# Patient Record
Sex: Female | Born: 1960 | Race: Black or African American | Hispanic: No | Smoking: Current every day smoker
Health system: Southern US, Community
[De-identification: ages and names within clinical notes are randomized; demographics above are authoritative.]

## PROBLEM LIST (undated history)

## (undated) DIAGNOSIS — F329 Major depressive disorder, single episode, unspecified: Secondary | ICD-10-CM

## (undated) DIAGNOSIS — I493 Ventricular premature depolarization: Secondary | ICD-10-CM

## (undated) DIAGNOSIS — F32A Depression, unspecified: Secondary | ICD-10-CM

## (undated) DIAGNOSIS — J302 Other seasonal allergic rhinitis: Secondary | ICD-10-CM

## (undated) DIAGNOSIS — M5481 Occipital neuralgia: Secondary | ICD-10-CM

## (undated) DIAGNOSIS — F419 Anxiety disorder, unspecified: Secondary | ICD-10-CM

## (undated) DIAGNOSIS — K219 Gastro-esophageal reflux disease without esophagitis: Secondary | ICD-10-CM

## (undated) DIAGNOSIS — Z87898 Personal history of other specified conditions: Secondary | ICD-10-CM

## (undated) DIAGNOSIS — G43909 Migraine, unspecified, not intractable, without status migrainosus: Secondary | ICD-10-CM

## (undated) DIAGNOSIS — I472 Ventricular tachycardia: Secondary | ICD-10-CM

## (undated) DIAGNOSIS — G709 Myoneural disorder, unspecified: Secondary | ICD-10-CM

## (undated) DIAGNOSIS — M1990 Unspecified osteoarthritis, unspecified site: Secondary | ICD-10-CM

## (undated) HISTORY — DX: Occipital neuralgia: M54.81

## (undated) HISTORY — DX: Anxiety disorder, unspecified: F41.9

## (undated) HISTORY — PX: MRI: SHX5353

## (undated) HISTORY — PX: TOTAL ABDOMINAL HYSTERECTOMY: SHX209

## (undated) HISTORY — PX: LAMINECTOMY: SHX219

## (undated) HISTORY — PX: TUBAL LIGATION: SHX77

## (undated) HISTORY — DX: Gastro-esophageal reflux disease without esophagitis: K21.9

## (undated) HISTORY — DX: Migraine, unspecified, not intractable, without status migrainosus: G43.909

## (undated) HISTORY — DX: Personal history of other specified conditions: Z87.898

## (undated) HISTORY — DX: Ventricular tachycardia: I47.2

## (undated) HISTORY — PX: CERVICAL FUSION: SHX112

## (undated) HISTORY — DX: Myoneural disorder, unspecified: G70.9

## (undated) HISTORY — DX: Unspecified osteoarthritis, unspecified site: M19.90

## (undated) HISTORY — DX: Major depressive disorder, single episode, unspecified: F32.9

## (undated) HISTORY — PX: CHOLECYSTECTOMY: SHX55

## (undated) HISTORY — DX: Other seasonal allergic rhinitis: J30.2

## (undated) HISTORY — DX: Ventricular premature depolarization: I49.3

## (undated) HISTORY — DX: Depression, unspecified: F32.A

---

## 1998-06-06 ENCOUNTER — Ambulatory Visit (HOSPITAL_COMMUNITY): Admission: RE | Admit: 1998-06-06 | Discharge: 1998-06-06 | Payer: Self-pay | Admitting: Neurosurgery

## 1998-06-15 ENCOUNTER — Inpatient Hospital Stay (HOSPITAL_COMMUNITY): Admission: RE | Admit: 1998-06-15 | Discharge: 1998-06-17 | Payer: Self-pay | Admitting: Neurosurgery

## 1998-08-02 ENCOUNTER — Ambulatory Visit (HOSPITAL_COMMUNITY): Admission: RE | Admit: 1998-08-02 | Discharge: 1998-08-02 | Payer: Self-pay | Admitting: Neurosurgery

## 1999-02-01 ENCOUNTER — Inpatient Hospital Stay (HOSPITAL_COMMUNITY): Admission: RE | Admit: 1999-02-01 | Discharge: 1999-02-02 | Payer: Self-pay | Admitting: Neurosurgery

## 1999-02-01 ENCOUNTER — Encounter: Payer: Self-pay | Admitting: Neurosurgery

## 1999-11-10 ENCOUNTER — Ambulatory Visit (HOSPITAL_COMMUNITY): Admission: RE | Admit: 1999-11-10 | Discharge: 1999-11-10 | Payer: Self-pay | Admitting: Neurosurgery

## 1999-11-15 ENCOUNTER — Ambulatory Visit (HOSPITAL_COMMUNITY): Admission: RE | Admit: 1999-11-15 | Discharge: 1999-11-15 | Payer: Self-pay | Admitting: Neurosurgery

## 1999-11-15 ENCOUNTER — Encounter: Payer: Self-pay | Admitting: Neurosurgery

## 1999-11-30 ENCOUNTER — Inpatient Hospital Stay (HOSPITAL_COMMUNITY): Admission: RE | Admit: 1999-11-30 | Discharge: 1999-12-01 | Payer: Self-pay | Admitting: Neurosurgery

## 1999-11-30 ENCOUNTER — Encounter: Payer: Self-pay | Admitting: Neurosurgery

## 2000-01-31 ENCOUNTER — Encounter: Payer: Self-pay | Admitting: Neurosurgery

## 2000-01-31 ENCOUNTER — Encounter: Admission: RE | Admit: 2000-01-31 | Discharge: 2000-01-31 | Payer: Self-pay | Admitting: Neurosurgery

## 2000-04-08 ENCOUNTER — Encounter: Payer: Self-pay | Admitting: Neurosurgery

## 2000-04-08 ENCOUNTER — Encounter: Admission: RE | Admit: 2000-04-08 | Discharge: 2000-04-08 | Payer: Self-pay | Admitting: Neurosurgery

## 2001-07-04 ENCOUNTER — Other Ambulatory Visit: Admission: RE | Admit: 2001-07-04 | Discharge: 2001-07-04 | Payer: Self-pay | Admitting: Internal Medicine

## 2002-04-06 ENCOUNTER — Encounter: Payer: Self-pay | Admitting: Family Medicine

## 2002-04-06 ENCOUNTER — Encounter: Admission: RE | Admit: 2002-04-06 | Discharge: 2002-04-06 | Payer: Self-pay | Admitting: Family Medicine

## 2002-12-01 ENCOUNTER — Emergency Department (HOSPITAL_COMMUNITY): Admission: EM | Admit: 2002-12-01 | Discharge: 2002-12-01 | Payer: Self-pay

## 2002-12-15 ENCOUNTER — Emergency Department (HOSPITAL_COMMUNITY): Admission: EM | Admit: 2002-12-15 | Discharge: 2002-12-15 | Payer: Self-pay | Admitting: Emergency Medicine

## 2002-12-15 ENCOUNTER — Encounter: Payer: Self-pay | Admitting: Emergency Medicine

## 2003-09-18 ENCOUNTER — Emergency Department (HOSPITAL_COMMUNITY): Admission: AD | Admit: 2003-09-18 | Discharge: 2003-09-19 | Payer: Self-pay | Admitting: Family Medicine

## 2003-12-17 ENCOUNTER — Inpatient Hospital Stay (HOSPITAL_COMMUNITY): Admission: RE | Admit: 2003-12-17 | Discharge: 2003-12-20 | Payer: Self-pay | Admitting: Neurosurgery

## 2004-02-18 ENCOUNTER — Emergency Department (HOSPITAL_COMMUNITY): Admission: EM | Admit: 2004-02-18 | Discharge: 2004-02-18 | Payer: Self-pay | Admitting: Emergency Medicine

## 2004-03-03 ENCOUNTER — Encounter: Admission: RE | Admit: 2004-03-03 | Discharge: 2004-03-03 | Payer: Self-pay | Admitting: Internal Medicine

## 2004-03-09 ENCOUNTER — Encounter: Admission: RE | Admit: 2004-03-09 | Discharge: 2004-03-09 | Payer: Self-pay | Admitting: Internal Medicine

## 2004-04-04 ENCOUNTER — Encounter: Admission: RE | Admit: 2004-04-04 | Discharge: 2004-04-04 | Payer: Self-pay | Admitting: Neurosurgery

## 2004-04-17 ENCOUNTER — Encounter (HOSPITAL_COMMUNITY): Admission: RE | Admit: 2004-04-17 | Discharge: 2004-05-17 | Payer: Self-pay | Admitting: Neurosurgery

## 2004-04-22 ENCOUNTER — Emergency Department (HOSPITAL_COMMUNITY): Admission: EM | Admit: 2004-04-22 | Discharge: 2004-04-23 | Payer: Self-pay | Admitting: Emergency Medicine

## 2004-05-17 ENCOUNTER — Encounter (HOSPITAL_COMMUNITY): Admission: RE | Admit: 2004-05-17 | Discharge: 2004-06-16 | Payer: Self-pay | Admitting: Neurosurgery

## 2004-06-19 ENCOUNTER — Encounter (HOSPITAL_COMMUNITY): Admission: RE | Admit: 2004-06-19 | Discharge: 2004-07-19 | Payer: Self-pay | Admitting: Neurosurgery

## 2004-06-27 ENCOUNTER — Ambulatory Visit (HOSPITAL_COMMUNITY): Admission: RE | Admit: 2004-06-27 | Discharge: 2004-06-27 | Payer: Self-pay | Admitting: Cardiology

## 2004-11-07 ENCOUNTER — Emergency Department (HOSPITAL_COMMUNITY): Admission: EM | Admit: 2004-11-07 | Discharge: 2004-11-07 | Payer: Self-pay | Admitting: Emergency Medicine

## 2005-02-08 ENCOUNTER — Ambulatory Visit (HOSPITAL_COMMUNITY): Admission: RE | Admit: 2005-02-08 | Discharge: 2005-02-08 | Payer: Self-pay | Admitting: Neurosurgery

## 2005-02-19 ENCOUNTER — Emergency Department (HOSPITAL_COMMUNITY): Admission: EM | Admit: 2005-02-19 | Discharge: 2005-02-19 | Payer: Self-pay | Admitting: Emergency Medicine

## 2005-02-20 ENCOUNTER — Emergency Department (HOSPITAL_COMMUNITY): Admission: EM | Admit: 2005-02-20 | Discharge: 2005-02-21 | Payer: Self-pay | Admitting: *Deleted

## 2005-02-23 ENCOUNTER — Ambulatory Visit: Payer: Self-pay | Admitting: Cardiology

## 2005-02-27 ENCOUNTER — Encounter: Admission: RE | Admit: 2005-02-27 | Discharge: 2005-02-27 | Payer: Self-pay | Admitting: Internal Medicine

## 2005-03-02 ENCOUNTER — Ambulatory Visit: Payer: Self-pay | Admitting: Cardiology

## 2005-04-02 ENCOUNTER — Other Ambulatory Visit: Admission: RE | Admit: 2005-04-02 | Discharge: 2005-04-02 | Payer: Self-pay | Admitting: Family Medicine

## 2005-05-15 ENCOUNTER — Ambulatory Visit: Payer: Self-pay | Admitting: Internal Medicine

## 2005-05-19 ENCOUNTER — Ambulatory Visit: Payer: Self-pay | Admitting: Family Medicine

## 2005-05-21 ENCOUNTER — Emergency Department (HOSPITAL_COMMUNITY): Admission: EM | Admit: 2005-05-21 | Discharge: 2005-05-21 | Payer: Self-pay | Admitting: Emergency Medicine

## 2005-08-28 ENCOUNTER — Encounter (HOSPITAL_COMMUNITY): Admission: RE | Admit: 2005-08-28 | Discharge: 2005-09-27 | Payer: Self-pay

## 2005-10-02 ENCOUNTER — Encounter (HOSPITAL_COMMUNITY)
Admission: RE | Admit: 2005-10-02 | Discharge: 2005-10-25 | Payer: Self-pay | Admitting: Physical Medicine and Rehabilitation

## 2005-10-31 ENCOUNTER — Encounter (HOSPITAL_COMMUNITY)
Admission: RE | Admit: 2005-10-31 | Discharge: 2005-11-30 | Payer: Self-pay | Admitting: Physical Medicine and Rehabilitation

## 2005-11-05 ENCOUNTER — Emergency Department (HOSPITAL_COMMUNITY): Admission: EM | Admit: 2005-11-05 | Discharge: 2005-11-05 | Payer: Self-pay | Admitting: Emergency Medicine

## 2005-11-13 ENCOUNTER — Ambulatory Visit (HOSPITAL_COMMUNITY): Admission: RE | Admit: 2005-11-13 | Discharge: 2005-11-13 | Payer: Self-pay | Admitting: Family Medicine

## 2005-12-05 ENCOUNTER — Encounter (HOSPITAL_COMMUNITY)
Admission: RE | Admit: 2005-12-05 | Discharge: 2006-01-04 | Payer: Self-pay | Admitting: Physical Medicine and Rehabilitation

## 2006-01-12 ENCOUNTER — Emergency Department (HOSPITAL_COMMUNITY): Admission: EM | Admit: 2006-01-12 | Discharge: 2006-01-12 | Payer: Self-pay | Admitting: Emergency Medicine

## 2006-01-17 ENCOUNTER — Encounter
Admission: RE | Admit: 2006-01-17 | Discharge: 2006-04-17 | Payer: Self-pay | Admitting: Physical Medicine and Rehabilitation

## 2006-04-26 ENCOUNTER — Ambulatory Visit (HOSPITAL_COMMUNITY): Admission: RE | Admit: 2006-04-26 | Discharge: 2006-04-26 | Payer: Self-pay | Admitting: Family Medicine

## 2006-05-03 ENCOUNTER — Ambulatory Visit: Payer: Self-pay | Admitting: Internal Medicine

## 2006-06-27 ENCOUNTER — Ambulatory Visit: Payer: Self-pay | Admitting: Internal Medicine

## 2006-07-03 ENCOUNTER — Ambulatory Visit: Payer: Self-pay | Admitting: Gastroenterology

## 2006-07-12 ENCOUNTER — Ambulatory Visit: Payer: Self-pay | Admitting: Cardiology

## 2006-07-15 ENCOUNTER — Ambulatory Visit: Payer: Self-pay | Admitting: Gastroenterology

## 2006-07-16 ENCOUNTER — Ambulatory Visit: Payer: Self-pay | Admitting: Gastroenterology

## 2006-07-17 ENCOUNTER — Emergency Department (HOSPITAL_COMMUNITY): Admission: EM | Admit: 2006-07-17 | Discharge: 2006-07-18 | Payer: Self-pay | Admitting: Emergency Medicine

## 2006-07-18 ENCOUNTER — Ambulatory Visit: Payer: Self-pay | Admitting: Internal Medicine

## 2006-08-08 ENCOUNTER — Encounter (INDEPENDENT_AMBULATORY_CARE_PROVIDER_SITE_OTHER): Payer: Self-pay | Admitting: *Deleted

## 2006-08-08 ENCOUNTER — Ambulatory Visit (HOSPITAL_COMMUNITY): Admission: RE | Admit: 2006-08-08 | Discharge: 2006-08-08 | Payer: Self-pay | Admitting: Gastroenterology

## 2006-08-08 ENCOUNTER — Encounter: Admission: RE | Admit: 2006-08-08 | Discharge: 2006-08-08 | Payer: Self-pay | Admitting: Internal Medicine

## 2006-08-10 ENCOUNTER — Emergency Department (HOSPITAL_COMMUNITY): Admission: EM | Admit: 2006-08-10 | Discharge: 2006-08-10 | Payer: Self-pay | Admitting: Emergency Medicine

## 2006-09-13 ENCOUNTER — Ambulatory Visit: Payer: Self-pay | Admitting: Cardiology

## 2006-09-17 ENCOUNTER — Ambulatory Visit: Payer: Self-pay | Admitting: Internal Medicine

## 2006-09-27 ENCOUNTER — Encounter: Admission: RE | Admit: 2006-09-27 | Discharge: 2006-09-27 | Payer: Self-pay | Admitting: Internal Medicine

## 2006-10-02 ENCOUNTER — Encounter
Admission: RE | Admit: 2006-10-02 | Discharge: 2006-10-02 | Payer: Self-pay | Admitting: Physical Medicine and Rehabilitation

## 2006-12-20 ENCOUNTER — Ambulatory Visit: Payer: Self-pay | Admitting: Internal Medicine

## 2007-02-19 ENCOUNTER — Ambulatory Visit: Payer: Self-pay | Admitting: Cardiology

## 2007-06-16 ENCOUNTER — Encounter
Admission: RE | Admit: 2007-06-16 | Discharge: 2007-06-16 | Payer: Self-pay | Admitting: Physical Medicine and Rehabilitation

## 2007-07-14 ENCOUNTER — Ambulatory Visit: Payer: Self-pay | Admitting: Gastroenterology

## 2007-07-24 ENCOUNTER — Encounter: Payer: Self-pay | Admitting: *Deleted

## 2007-07-24 DIAGNOSIS — Z9089 Acquired absence of other organs: Secondary | ICD-10-CM | POA: Insufficient documentation

## 2007-07-24 DIAGNOSIS — Z8739 Personal history of other diseases of the musculoskeletal system and connective tissue: Secondary | ICD-10-CM | POA: Insufficient documentation

## 2007-07-24 DIAGNOSIS — G43909 Migraine, unspecified, not intractable, without status migrainosus: Secondary | ICD-10-CM | POA: Insufficient documentation

## 2007-07-24 DIAGNOSIS — K219 Gastro-esophageal reflux disease without esophagitis: Secondary | ICD-10-CM | POA: Insufficient documentation

## 2007-08-12 ENCOUNTER — Encounter: Admission: RE | Admit: 2007-08-12 | Discharge: 2007-08-12 | Payer: Self-pay | Admitting: Internal Medicine

## 2007-08-18 ENCOUNTER — Encounter: Admission: RE | Admit: 2007-08-18 | Discharge: 2007-08-18 | Payer: Self-pay | Admitting: Internal Medicine

## 2007-10-11 ENCOUNTER — Encounter
Admission: RE | Admit: 2007-10-11 | Discharge: 2007-10-11 | Payer: Self-pay | Admitting: Physical Medicine and Rehabilitation

## 2007-11-07 ENCOUNTER — Ambulatory Visit: Payer: Self-pay | Admitting: Cardiology

## 2007-11-21 ENCOUNTER — Ambulatory Visit: Payer: Self-pay | Admitting: Cardiology

## 2007-11-21 ENCOUNTER — Ambulatory Visit (HOSPITAL_COMMUNITY): Admission: RE | Admit: 2007-11-21 | Discharge: 2007-11-21 | Payer: Self-pay | Admitting: Cardiology

## 2007-12-03 ENCOUNTER — Other Ambulatory Visit: Admission: RE | Admit: 2007-12-03 | Discharge: 2007-12-03 | Payer: Self-pay | Admitting: Family Medicine

## 2007-12-13 ENCOUNTER — Emergency Department (HOSPITAL_COMMUNITY): Admission: EM | Admit: 2007-12-13 | Discharge: 2007-12-13 | Payer: Self-pay | Admitting: Emergency Medicine

## 2007-12-27 ENCOUNTER — Ambulatory Visit: Payer: Self-pay | Admitting: Family Medicine

## 2007-12-31 ENCOUNTER — Encounter: Payer: Self-pay | Admitting: Internal Medicine

## 2008-06-30 ENCOUNTER — Telehealth: Payer: Self-pay | Admitting: Gastroenterology

## 2008-07-12 ENCOUNTER — Ambulatory Visit: Payer: Self-pay | Admitting: Internal Medicine

## 2008-10-20 ENCOUNTER — Ambulatory Visit: Payer: Self-pay | Admitting: Internal Medicine

## 2008-10-20 DIAGNOSIS — J069 Acute upper respiratory infection, unspecified: Secondary | ICD-10-CM | POA: Insufficient documentation

## 2008-12-02 ENCOUNTER — Encounter
Admission: RE | Admit: 2008-12-02 | Discharge: 2008-12-02 | Payer: Self-pay | Admitting: Physical Medicine and Rehabilitation

## 2009-01-05 ENCOUNTER — Ambulatory Visit: Payer: Self-pay | Admitting: Cardiology

## 2009-01-16 ENCOUNTER — Emergency Department (HOSPITAL_COMMUNITY): Admission: EM | Admit: 2009-01-16 | Discharge: 2009-01-16 | Payer: Self-pay | Admitting: Emergency Medicine

## 2009-01-30 ENCOUNTER — Ambulatory Visit: Payer: Self-pay | Admitting: Cardiology

## 2009-02-15 ENCOUNTER — Ambulatory Visit: Payer: Self-pay | Admitting: Cardiology

## 2009-04-11 ENCOUNTER — Ambulatory Visit: Payer: Self-pay | Admitting: Cardiology

## 2009-04-28 ENCOUNTER — Ambulatory Visit: Payer: Self-pay | Admitting: Gastroenterology

## 2009-04-28 DIAGNOSIS — L290 Pruritus ani: Secondary | ICD-10-CM | POA: Insufficient documentation

## 2009-05-19 ENCOUNTER — Encounter (INDEPENDENT_AMBULATORY_CARE_PROVIDER_SITE_OTHER): Payer: Self-pay | Admitting: *Deleted

## 2009-05-19 LAB — CONVERTED CEMR LAB
ALT: 13 units/L
ALT: 13 units/L
AST: 14 units/L
AST: 14 units/L
Albumin: 4.2 g/dL
Albumin: 4.2 g/dL
Alkaline Phosphatase: 58 units/L
Alkaline Phosphatase: 58 units/L
BUN: 11 mg/dL
BUN: 11 mg/dL
CO2: 20 meq/L
CO2: 20 meq/L
Calcium: 9.2 mg/dL
Calcium: 9.2 mg/dL
Chloride: 105 meq/L
Chloride: 105 meq/L
Cholesterol: 144 mg/dL
Cholesterol: 144 mg/dL
Creatinine, Ser: 0.82 mg/dL
Creatinine, Ser: 0.82 mg/dL
GFR calc non Af Amer: >60 mL/min
GFR calc non Af Amer: >60 mL/min
Glomerular Filtration Rate, Af Am: >60 mL/min/{1.73_m2}
Glomerular Filtration Rate, Af Am: >60 mL/min/{1.73_m2}
Glucose, Bld: 86 mg/dL
Glucose, Bld: 86 mg/dL
HDL: 44 mg/dL
HDL: 44 mg/dL
LDL Cholesterol: 68 mg/dL
LDL Cholesterol: 68 mg/dL
Potassium: 4.4 meq/L
Potassium: 4.4 meq/L
Sodium: 137 meq/L
Sodium: 137 meq/L
Total Protein: 7.4 g/dL
Total Protein: 7.4 g/dL
Triglycerides: 162 mg/dL
Triglycerides: 162 mg/dL

## 2009-07-25 ENCOUNTER — Ambulatory Visit (HOSPITAL_COMMUNITY)
Admission: RE | Admit: 2009-07-25 | Discharge: 2009-07-25 | Payer: Self-pay | Admitting: Physical Medicine and Rehabilitation

## 2009-11-08 ENCOUNTER — Encounter (INDEPENDENT_AMBULATORY_CARE_PROVIDER_SITE_OTHER): Payer: Self-pay | Admitting: *Deleted

## 2009-11-09 DIAGNOSIS — R079 Chest pain, unspecified: Secondary | ICD-10-CM | POA: Insufficient documentation

## 2009-11-28 ENCOUNTER — Ambulatory Visit: Payer: Self-pay | Admitting: Internal Medicine

## 2009-12-06 ENCOUNTER — Encounter (INDEPENDENT_AMBULATORY_CARE_PROVIDER_SITE_OTHER): Payer: Self-pay | Admitting: *Deleted

## 2009-12-08 ENCOUNTER — Encounter: Admission: RE | Admit: 2009-12-08 | Discharge: 2009-12-08 | Payer: Self-pay | Admitting: Internal Medicine

## 2009-12-16 ENCOUNTER — Encounter: Payer: Self-pay | Admitting: Adult Health

## 2009-12-16 ENCOUNTER — Ambulatory Visit: Payer: Self-pay | Admitting: Cardiovascular Disease

## 2009-12-20 ENCOUNTER — Encounter: Admission: RE | Admit: 2009-12-20 | Discharge: 2009-12-20 | Payer: Self-pay | Admitting: Internal Medicine

## 2009-12-24 ENCOUNTER — Emergency Department (HOSPITAL_COMMUNITY)
Admission: EM | Admit: 2009-12-24 | Discharge: 2009-12-24 | Payer: Self-pay | Source: Home / Self Care | Admitting: Emergency Medicine

## 2010-01-26 ENCOUNTER — Encounter (HOSPITAL_COMMUNITY): Admission: RE | Admit: 2010-01-26 | Discharge: 2010-02-25 | Payer: Self-pay | Admitting: Neurology

## 2010-02-06 ENCOUNTER — Ambulatory Visit: Payer: Self-pay | Admitting: Internal Medicine

## 2010-02-06 DIAGNOSIS — I4949 Other premature depolarization: Secondary | ICD-10-CM | POA: Insufficient documentation

## 2010-02-06 DIAGNOSIS — R9431 Abnormal electrocardiogram [ECG] [EKG]: Secondary | ICD-10-CM | POA: Insufficient documentation

## 2010-02-08 ENCOUNTER — Telehealth (INDEPENDENT_AMBULATORY_CARE_PROVIDER_SITE_OTHER): Payer: Self-pay | Admitting: *Deleted

## 2010-02-09 ENCOUNTER — Encounter (HOSPITAL_COMMUNITY): Admission: RE | Admit: 2010-02-09 | Discharge: 2010-03-30 | Payer: Self-pay | Admitting: Cardiovascular Disease

## 2010-02-09 ENCOUNTER — Encounter: Payer: Self-pay | Admitting: Cardiovascular Disease

## 2010-02-09 ENCOUNTER — Ambulatory Visit: Payer: Self-pay | Admitting: Cardiology

## 2010-02-09 ENCOUNTER — Ambulatory Visit: Payer: Self-pay

## 2010-02-14 ENCOUNTER — Encounter: Payer: Self-pay | Admitting: Internal Medicine

## 2010-03-03 ENCOUNTER — Emergency Department (HOSPITAL_COMMUNITY): Admission: EM | Admit: 2010-03-03 | Discharge: 2010-03-03 | Payer: Self-pay | Admitting: Emergency Medicine

## 2010-03-09 ENCOUNTER — Telehealth: Payer: Self-pay | Admitting: Internal Medicine

## 2010-03-10 ENCOUNTER — Emergency Department (HOSPITAL_COMMUNITY): Admission: EM | Admit: 2010-03-10 | Discharge: 2010-03-10 | Payer: Self-pay | Admitting: Emergency Medicine

## 2010-04-13 ENCOUNTER — Ambulatory Visit: Payer: Self-pay | Admitting: Internal Medicine

## 2010-04-13 DIAGNOSIS — F411 Generalized anxiety disorder: Secondary | ICD-10-CM | POA: Insufficient documentation

## 2010-05-05 ENCOUNTER — Telehealth: Payer: Self-pay | Admitting: Internal Medicine

## 2010-05-09 ENCOUNTER — Telehealth: Payer: Self-pay | Admitting: Internal Medicine

## 2010-05-09 ENCOUNTER — Encounter: Payer: Self-pay | Admitting: Internal Medicine

## 2010-05-11 ENCOUNTER — Ambulatory Visit: Payer: Self-pay | Admitting: Internal Medicine

## 2010-05-28 ENCOUNTER — Emergency Department (HOSPITAL_COMMUNITY): Admission: EM | Admit: 2010-05-28 | Discharge: 2010-05-28 | Payer: Self-pay | Admitting: Emergency Medicine

## 2010-05-31 ENCOUNTER — Ambulatory Visit: Payer: Self-pay | Admitting: Internal Medicine

## 2010-05-31 DIAGNOSIS — K612 Anorectal abscess: Secondary | ICD-10-CM | POA: Insufficient documentation

## 2010-11-19 ENCOUNTER — Encounter: Payer: Self-pay | Admitting: Physical Medicine and Rehabilitation

## 2010-11-19 ENCOUNTER — Encounter: Payer: Self-pay | Admitting: Family Medicine

## 2010-11-19 ENCOUNTER — Encounter: Payer: Self-pay | Admitting: Internal Medicine

## 2010-11-19 ENCOUNTER — Encounter: Payer: Self-pay | Admitting: Gastroenterology

## 2010-11-27 ENCOUNTER — Other Ambulatory Visit: Payer: Self-pay | Admitting: Internal Medicine

## 2010-11-27 DIAGNOSIS — Z1239 Encounter for other screening for malignant neoplasm of breast: Secondary | ICD-10-CM

## 2010-11-28 NOTE — Procedures (Signed)
Summary: Gastroenterology endo  Gastroenterology endo   Imported By: Donneta Romberg 01/31/2008 09:02:41  _____________________________________________________________________  External Attachment:    Type:   Image     Comment:   External Document

## 2010-11-28 NOTE — Assessment & Plan Note (Signed)
Summary: runny nose,watery eyes,no fever,no sore throat/cd   Vital Signs:  Patient Profile:   50 Years Old Female Weight:      170 pounds O2 Sat:      99 % Temp:     97.8 degrees F Pulse rate:   62 / minute Pulse rhythm:   regular BP sitting:   128 / 82  (right arm) Cuff size:   regular  Vitals Entered By: Rock Nephew CMA (July 12, 2008 1:14 PM)                 Chief Complaint:  sinus pressure and runny nose.  History of Present Illness: 50 year old patient new to me he can see Dr. Deeann Dowse today complaining of one-week history of sinus pressure, headache, fatigue, runny nose and congestion. She has popping and discomfort in both ears but no dizziness or vertigo. She denies experiencing any cough, sore throat, fever or chills. She doesn't tolerate antihistamines or decongestants due to exacerbations of palpitations. She has been taking Claritin but says it's not beneficial and is causing her to experience some palpitations.    Prior Medication List:  ATENOLOL 50 MG  TABS (ATENOLOL) Take one tablet once daily GABAPENTIN 300 MG  TABS (GABAPENTIN) Take one tablet 3 times daily NEXIUM 40 MG  CPDR (ESOMEPRAZOLE MAGNESIUM) Take one tablet once daily HYDROCODONE-ACETAMINOPHEN 5-500 MG  TABS (HYDROCODONE-ACETAMINOPHEN) Take as needed DIAZEPAM 2 MG TABS (DIAZEPAM) Take 1 tablet by mouth three times a day   Current Allergies (reviewed today): No known allergies   Past Medical History:    Reviewed history from 01/16/2008 and no changes required:       URI (ICD-465.9)       MIGRAINE HEADACHE (ICD-346.90)       PALPITATIONS, HX OF (ICD-V12.50)       GASTROESOPHAGEAL REFLUX DISEASE (ICD-530.81)       DEGENERATIVE DISC DISEASE, CERVICAL SPINE, 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)         Past Surgical History:    Reviewed history from 07/24/2007 and no changes required:       * C3-5 DECOMPRESSIVE LAM AND FUSION       TUBAL LIGATION, HX OF (ICD-V26.51)       TOTAL ABDOMINAL HYSTERECTOMY, HX  OF (ICD-V45.77)       CHOLECYSTECTOMY, HX OF (ICD-V45.79)                  Family History:    Reviewed history and no changes required:  Social History:    Occupation:    Married    Alcohol use-no    Drug use-no   Risk Factors:  Tobacco use:  quit    Year started:  09/2007    Year quit:  09/2007 Passive smoke exposure:  no Drug use:  no HIV high-risk behavior:  no Alcohol use:  no Exercise:  no  Family History Risk Factors:    Family History of MI in females < 89 years old:  no    Family History of MI in males < 5 years old:  no   Review of Systems  The patient denies anorexia, fever, decreased hearing, hoarseness, chest pain, syncope, dyspnea on exertion, peripheral edema, prolonged cough, hemoptysis, abdominal pain, melena, hematochezia, severe indigestion/heartburn, and enlarged lymph nodes.     Physical Exam  General:     alert, well-developed, well-nourished, well-hydrated, appropriate dress, normal appearance, healthy-appearing, and cooperative to examination.   Nose:     no nasal discharge, no nasal polyps, no  sinus percussion tenderness, mucosal edema, and airflow obstruction.  there is mucosal swelling and pallor. This is more prominent on the right and left. Mouth:     good dentition, pharynx pink and moist, no erythema, no exudates, no posterior lymphoid hypertrophy, no postnasal drip, and no lesions.   Neck:     supple, full ROM, no masses, and no cervical lymphadenopathy.   Lungs:     Normal respiratory effort, chest expands symmetrically. Lungs are clear to auscultation, no crackles or wheezes. Heart:     Normal rate and regular rhythm. S1 and S2 normal without gallop, murmur, click, rub or other extra sounds. Skin:     turgor normal, color normal, and no rashes.      Impression & Recommendations:  Problem # 1:  ALLERGIC RHINITIS DUE TO POLLEN (ICD-477.0) Assessment: Deteriorated  Problem # 2:  URI (ICD-465.9) Assessment: Comment  Only  Complete Medication List: 1)  Atenolol 50 Mg Tabs (Atenolol) .... Take one tablet once daily 2)  Gabapentin 300 Mg Tabs (Gabapentin) .... Take one tablet 3 times daily 3)  Nexium 40 Mg Cpdr (Esomeprazole magnesium) .... Take one tablet once daily 4)  Hydrocodone-acetaminophen 5-500 Mg Tabs (Hydrocodone-acetaminophen) .... Take as needed 5)  Diazepam 2 Mg Tabs (Diazepam) .... Take 1 tablet by mouth three times a day 6)  Nasonex 50 Mcg/act Susp (Mometasone furoate) .... 2 puffs each nostril q. day 7)  Singulair 10 Mg Tabs (Montelukast sodium) .... Once daily   Patient Instructions: 1)  use the nasal spray as directed 2 puffs each nostril once a day. It will take several days for this medication to become effective and we'll treat and prevent any more allergic flares. Discontinue the OTC  antihistamines and decongestants due to history of palpitations and instead use the Singulair 10 mg once a day 2)  Please schedule a follow-up appointment in 2 weeks. 3)  Acute sinusitis symptoms for less than 10 days are not helped by antibiotics.Use warm moist compresses, and over the counter decongestants ( only as directed). Call if no improvement in 5-7 days, sooner if increasing pain, fever, or new symptoms.   Prescriptions: SINGULAIR 10 MG TABS (MONTELUKAST SODIUM) once daily  #0 x 0   Entered and Authorized by:   Etta Grandchild MD   Signed by:   Etta Grandchild MD on 07/12/2008   Method used:   Historical   RxID:   [id number] NASONEX 50 MCG/ACT SUSP (MOMETASONE FUROATE) 2 puffs each nostril q. day  #0 x 0   Entered and Authorized by:   Etta Grandchild MD   Signed by:   Etta Grandchild MD on 07/12/2008   Method used:   Historical   RxID:   [id number]  ]  Appended Document: runny nose,watery eyes,no fever,no sore throat/cd As billing provider, I have reviewed this document.

## 2010-11-28 NOTE — Assessment & Plan Note (Signed)
Summary: upper abd pain...as.   History of Present Illness Visit Type: Follow-up Visit Primary GI MD: Elie Goody MD [facility] Primary Provider: [name] Requesting Provider: n/a Chief Complaint: Epigastric pain that radiates to her left side History of Present Illness:   This is a 50 year old female who has had chronic problems with palpitations and pulsating sensations in her chest and upper abdomen. She has been diagnosed with frequent PVCs. She has recurrent postprandial bloating associated and epigastric discomfort. She has had a 20 pound weight gain since discontinuing cigarettes in December 2008. She is currently taking Prevacid OTC. EGD performed in September 2007 was normal. A gastric emptying scan showed a minimal delay at 2 hours. She notes occasional rectal itching not relieved with Tucks pads. She has had some darker stools after eating greens recently. She has noted mild constipation intermittently.   GI Review of Systems    Reports abdominal pain, bloating, and  chest pain.     Location of  Abdominal pain: epigastric area.    Denies acid reflux, belching, dysphagia with liquids, dysphagia with solids, heartburn, loss of appetite, nausea, vomiting, vomiting blood, weight loss, and  weight gain.      Reports constipation, hemorrhoids, and  rectal pain.     Denies anal fissure, black tarry stools, change in bowel habit, diarrhea, diverticulosis, fecal incontinence, heme positive stool, irritable bowel syndrome, jaundice, light color stool, liver problems, and  rectal bleeding.   Current Medications (verified): 1)  Acebutolol Hcl 200 Mg Caps (Acebutolol Hcl) .Marland Kitchen.. 1 By Mouth Three Times A Day 2)  Gabapentin 300 Mg  Tabs (Gabapentin) .... Take One Tablet 3 Times Daily 3)  Prevacid 15 Mg Cpdr (Lansoprazole) .Marland Kitchen.. 1 By Mouth Once Daily 4)  Hydrocodone-Acetaminophen 5-500 Mg  Tabs (Hydrocodone-Acetaminophen) .... 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  quit dec 08 Regular Exercise - no Drug Use - no 4 CHILDREN UNEMPLOYED  Review of Systems       The patient complains of allergy/sinus, arthritis/joint pain, back pain, headaches-new, and sleeping problems.         The pertinent positives and negatives are noted as above and in the HPI. All other ROS were reviewed and were negative.   Vital Signs:  Patient profile:   50 year old female Height:      68 inches Weight:      170 pounds BMI:     25.94 BSA:     1.91 Pulse rate:   78 / minute Pulse rhythm:   regular BP sitting:   117 / 70  (left arm)  Vitals Entered By: Merri Ray CMA (April 28, 2009 8:41 AM)  Physical Exam  General:  Well developed, well nourished, no acute distress. Head:  Normocephalic and atraumatic. Eyes:  PERRLA, no icterus. Ears:  Normal auditory acuity. Mouth:  No deformity or lesions, dentition normal. Neck:  Supple; no masses or thyromegaly. Lungs:  Clear throughout to auscultation. Heart:  Regular rate and rhythm; no murmurs, rubs,  or bruits. Abdomen:  Soft, nontender and nondistended. No masses, hepatosplenomegaly or hernias noted. Normal bowel sounds. Rectal:  Normal exam. hemocult negative.   Msk:  Symmetrical with no gross deformities. Normal posture. Pulses:  Normal pulses  noted. Extremities:  No clubbing, cyanosis, edema or deformities noted. Neurologic:  Alert and  oriented x4;  grossly normal neurologically. Cervical Nodes:  No significant cervical adenopathy. Inguinal Nodes:  No significant inguinal adenopathy. Psych:  Alert and cooperative. Normal mood and affect.   Impression & Recommendations:  Problem # 1:  GASTROESOPHAGEAL REFLUX DISEASE (ICD-530.81) Assessment Deteriorated Presumed GERD. She may have a component of gastroparesis. Advised to follow standard dietary and lifestyle recommendations for gastrparesis and GERD. Increased Prevacid 24HR to 30 mg daily q. day taken 30 minutes before meals.  Problem # 2:  GASTROPARESIS (ICD-536.3) Assessment: Deteriorated As in problem #1.  Problem # 3:  SCREENING COLORECTAL-CANCER (ICD-V76.51) Assessment: Unchanged Routine colorectal cancer screening at age 35, July 2012.  Problem # 4:  ANAL PRURITUS (ICD-698.0) Assessment: New Standard rectal care instructions provided.  Patient Instructions: 1)  Increase Prevacid to one tablet by mouth two times a day. 2)  Avoid foods high in acid content ( tomatoes, citrus juices, spicy foods) . Avoid eating within 3 to 4 hours of lying down or before exercising. Do not over eat; try smaller more frequent meals. Elevate head of bed four inches when sleeping.  3)  Copy sent to : Illene Regulus, MD 4)  The medication list was reviewed and reconciled.  All changed / newly prescribed medications were explained.  A complete medication list was provided to the patient / caregiver.  Appended Document: upper abd pain...as. correction no gastroparesis  GES was normal at 2 hours

## 2010-11-28 NOTE — Progress Notes (Signed)
Summary: Depression/Anxiety  Phone Note Call from Patient Call back at [phone]   Summary of Call: Pt has been taking fluoxetine since 04/14/2010. She c/o feeling "jittery" and "funny" feeling since being on med. Pt stopped taking medication last friday and that feeling has gone away. She is req alternative to help w/anxiety. Please advise.  Initial call taken by: Lamar Sprinkles, CMA,  May 09, 2010 4:25 PM  Follow-up for Phone Call        OV Follow-up by: Jacques Navy MD,  May 09, 2010 6:55 PM  Additional Follow-up for Phone Call Additional follow up Details #1::        pt has ov tomm Additional Follow-up by: Ami Bullins CMA,  May 10, 2010 11:13 AM

## 2010-11-28 NOTE — Progress Notes (Signed)
Summary: myoview results   Phone Note Outgoing Call Call back at [facility] Phone [phone]   Call placed by: Gypsy Balsam RN BSN,  Mar 09, 2010 1:49 PM Summary of Call: Called patient and left message on machine, Dr Graciela Husbands reviewed Celine Ahr, recommends Flecainide 100mg  two times a day for suppression of PVC's.  Pt will need GXT to look for pro-arrythmia 3 days after starting medication. Gypsy Balsam RN BSN  Mar 09, 2010 1:50 PM   Follow-up for Phone Call        Pt does not wish to take AAD drug therapy at this time.  Will revisit at appt in July with SK. Gypsy Balsam RN BSN  Mar 14, 2010 2:19 PM

## 2010-11-28 NOTE — Assessment & Plan Note (Signed)
Summary: Congestion, cough (sat clinic)   Vital Signs:  Patient Profile:   50 Years Old Female Weight:      164 pounds Temp:     97.4 degrees F rectal Pulse rate:   72 / minute Pulse rhythm:   regular BP sitting:   108 / 72  (left arm) Cuff size:   regular  Vitals Entered By: Liane Comber (December 27, 2007 9:54 AM)                 Chief Complaint:  cough/ congestion .  History of Present Illness: Here in Sat [place] , usually seen by Dr Debby Bud.  Is having dry cough, had dark productive sputum with burning in throat and throat sore.  No fever but having chills...no one [place] at home sick... No headache, noear pain, mild rhinitis which is clear, eyes watering a lot, throat sore at night. Meds she has taken tylenol warming.    Current Allergies: No known allergies       Physical Exam  General:     Well-developed,well-nourished,in no acute distress; alert,appropriate and cooperative throughout examination, SOUNDS CLEAR. Head:     Normocephalic and atraumatic without obvious abnormalities. No apparent alopecia or balding. Eyes:     Conjunctiva clear bilaterally. some tearing. Ears:     dull TMs but nonerythem. Nose:     External nasal examination shows no deformity or inflammation. Nasal mucosa are pink and moist without lesions or exudates. Mouth:     Oral mucosa and oropharynx without lesions or exudates.  Teeth in good repair. Neck:     No deformities, masses, or tenderness noted. Chest Wall:     No deformities, masses, or tenderness noted. Lungs:     Normal respiratory effort, chest expands symmetrically. Lungs are clear to auscultation, no crackles or wheezes. Heart:     Normal rate and regular rhythm. S1 and S2 normal without gallop, murmur, click, rub or other extra sounds.    Impression & Recommendations:  Problem # 1:  URI (ICD-465.9) Assessment: New See instructions. Instructed on symptomatic treatment. Call if symptoms persist or worsen.    Complete Medication List: 1)  Atenolol 50 Mg Tabs (Atenolol) .... Take one tablet once daily 2)  Gabapentin 300 Mg Tabs (Gabapentin) .... Take one tablet 3 times daily 3)  Nexium 40 Mg Cpdr (Esomeprazole magnesium) .... Take one tablet once daily 4)  Hydrocodone-acetaminophen 5-500 Mg Tabs (Hydrocodone-acetaminophen) .... Take as needed 5)  Diazepam 2 Mg Tabs (Diazepam) .... Take 1 tablet by mouth three times a day   Patient Instructions: 1)  Take GUAIFENESIN  600mg  by mouth AM and NOON   2)    ROBITUSSIN PLAIN (NO LETTERS, NO NAMES) two tablespoons  or 3)    [facility] MUCOUS RELIEF EXPECTORANT (400 mg) 11/2 TABS  or 4)    GUAIFENESIN (200 MG) 3 TABS                     5)  DRINK LOTS OF FLUIDS 6)  TYL ES 2 FOUR TIMES A DAY.    ]

## 2010-11-28 NOTE — Progress Notes (Signed)
Summary: Nuclear Pre-Procedure  Phone Note Outgoing Call   Call placed by: Milana Na, EMT-P,  February 08, 2010 3:51 PM Summary of Call: Reviewed information on Myoview Information Sheet (see scanned document for further details).  Spoke with patient.     Nuclear Med Background Indications for Stress Test: Evaluation for Ischemia, Abnormal EKG  Indications Comments: RX therapy vs. catheter ablation  History: Echo  History Comments: '09 STRESS ECHO (-) PVCS with LBBB morphology  Symptoms: Palpitations    Nuclear Pre-Procedure Cardiac Risk Factors: Family History - CAD, History of Smoking Height (in): 68  Nuclear Med Study Referring MD:  S.Klein

## 2010-11-28 NOTE — Miscellaneous (Signed)
  Clinical Lists Changes  Medications: Added new medication of ACEBUTOLOL HCL 200 MG CAPS (ACEBUTOLOL HCL) Take 1 tablet by mouth three times a day - Signed Rx of ACEBUTOLOL HCL 200 MG CAPS (ACEBUTOLOL HCL) Take 1 tablet by mouth three times a day;  #90 x 11;  Signed;  Entered by: Teressa Lower RN;  Authorized by: Kathlen Brunswick, MD, [facility];  Method used: Electronically to [facility] [address] [place], [place], [place]  [address], Ph: [phone], Fax: [phone]    Prescriptions: ACEBUTOLOL HCL 200 MG CAPS (ACEBUTOLOL HCL) Take 1 tablet by mouth three times a day  #90 x 11   Entered by:   Teressa Lower RN   Authorized by:   Kathlen Brunswick, MD, [facility]   Signed by:   Teressa Lower RN on 04/11/2009   Method used:   Electronically to        [facility] (retail)       [address]       [place], [place]  [address]       Ph: [phone]       Fax: [phone]   RxID:   [id number]

## 2010-11-28 NOTE — Assessment & Plan Note (Signed)
Summary: Cardiology Nuclear Study**LMTCB**  Nuclear Med Background Indications for Stress Test: Evaluation for Ischemia, Abnormal EKG  Indications Comments: RX therapy vs. catheter ablation  History: Echo  History Comments: '09 STRESS ECHO (-) PVCS with LBBB morphology  Symptoms: Chest Pain, Palpitations    Nuclear Pre-Procedure Cardiac Risk Factors: Family History - CAD, History of Smoking Caffeine/Decaff Intake: none NPO After: 9:00 PM Lungs: clear IV 0.9% NS with Angio Cath: 20g     IV Site: (R) AC IV Started by: Stanton Kidney EMT-P Chest Size (in) 36     Cup Size C     Height (in): 70 Weight (lb): 182 BMI: 26.21 Tech Comments: acebutolol held approx. 22 hours, per Patient.  Nuclear Med Study 1 or 2 day study:  1 day     Stress Test Type:  Stress Reading MD:  Olga Millers, MD     Referring MD:  S.Klein Resting Radionuclide:  Technetium 91m Tetrofosmin     Resting Radionuclide Dose:  10.6 mCi  Stress Radionuclide:  Technetium 60m Tetrofosmin     Stress Radionuclide Dose:  33.0 mCi   Stress Protocol Exercise Time (min):  8:31 min     Max HR:  150 bpm     Predicted Max HR:  172 bpm  Max Systolic BP: 175 mm Hg     Percent Max HR:  87.21 %     METS: 10.00 Rate Pressure Product:  16109    Stress Test Technologist:  Milana Na EMT-P     Nuclear Technologist:  Domenic Polite CNMT  Rest Procedure  Myocardial perfusion imaging was performed at rest 45 minutes following the intravenous administration of Myoview Technetium 4m Tetrofosmin.  Stress Procedure  The patient exercised for 8:31. The patient stopped due to fatigue and denied any chest pain.  The patient states she had cp when she got here today. There were no significant ST-T wave changes and occ pvcs.  Myoview was injected at peak exercise and myocardial perfusion imaging was performed after a brief delay.  QPS Raw Data Images:  There is a breast shadow that accounts for the anterior attenuation. Stress  Images:  There is decreased uptake in the anterior wall. Rest Images:  There is decreased uptake in the anterior wall. Subtraction (SDS):  No evidence of ischemia. Transient Ischemic Dilatation:  1.12  (Normal <1.22)  Lung/Heart Ratio:  .33  (Normal <0.45)  Quantitative Gated Spect Images QGS EDV:  101 ml QGS ESV:  46 ml QGS EF:  55 % QGS cine images:  Normal wall motion.   Overall Impression  Exercise Capacity: Good exercise capacity. BP Response: Normal blood pressure response. Clinical Symptoms: There is chest pain ECG Impression: No significant ST segment change suggestive of ischemia. Overall Impression: There is probable breast attenuation but  no sign of scar or ischemia.  Appended Document: Cardiology Nuclear Study**LMTCB** flecanidie 100 bid

## 2010-11-28 NOTE — Assessment & Plan Note (Signed)
Summary: office visit to discuss medications   Vital Signs:  Patient profile:   50 year old female Height:      70 inches Weight:      180 pounds BMI:     25.92 O2 Sat:      99 % on Room air Temp:     98.3 degrees F oral Pulse rate:   52 / minute BP sitting:   122 / 88  (left arm) Cuff size:   regular  Vitals Entered By: [name] CMA (May 11, 2010 10:56 AM)  O2 Flow:  Room air CC: Pt here with c/o dizziness with sinus pressure/ she would also like to discuss Fluoxetime/ ab   Primary Care Provider:  Micheal Tashawnda Bleiler,MD  CC:  Pt here with c/o dizziness with sinus pressure/ she would also like to discuss Fluoxetime/ ab.  History of Present Illness: Patient presents with intermittent symptoms of dizzyness and pain in the facial sinus. No fever, no chills, no drainage. She has felt off balance. She was recently started on fluoxetine but this symptoms just started in the past several days.   She was seen by Dr. Graciela Husbands today for PVCs. Changing her medication.   Current Medications (verified): 1)  Pindolol 5 Mg Tabs (Pindolol) .... Take 1/2 Tablet Daily 2)  Gabapentin 300 Mg Caps (Gabapentin) .... Take 1 Capsule By Mouth Three Times A Day 3)  Hydrocodone-Acetaminophen 7.5-750 Mg Tabs (Hydrocodone-Acetaminophen) .... Take As Needed 4)  Mag-Oxide 400 Mg Tabs (Magnesium Oxide) .Marland Kitchen.. 1 Tablet By Mouth Two Times A Day-  Pt Has Not Been Taking 5)  Nexium 20 Mg Cpdr (Esomeprazole Magnesium) .... Take One Capsule Once Daily 6)  Fluoxetine Hcl 20 Mg Caps (Fluoxetine Hcl) .Marland Kitchen.. 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)-status post surgical intervention x3 Depression  Past Surgical History: Last updated: 11/09/2009 C3-5 DECOMPRESSIVE laminectomy and fusion-1999 +2 additional surgical procedures TUBAL LIGATION, HX OF (ICD-V26.51) TOTAL ABDOMINAL HYSTERECTOMY, HX OF (ICD-V45.77)-1996 CHOLECYSTECTOMY, HX OF (ICD-V45.79)-1995     Family History: Last updated: 11/09/2009 Mother had multiple sclerosis Father had cardiac disease No FH of Colon Cancer: Family History of Diabetes: mother  Social History: Last updated: 11/09/2009 Occupation: retired [employer] Married Alcohol use-no Drug use-no; no caffeine Tobacco Use - Yes.  quit dec 08 Regular Exercise - no Drug Use - no 4 CHILDREN; reports increased stress in due to caring for grandchildren UNEMPLOYED  Risk Factors: Smoking Status: current (03/17/2009) Passive Smoke Exposure: no (07/12/2008)  Review of Systems  The patient denies anorexia, fever, weight loss, weight gain, decreased hearing, chest pain, abdominal pain, severe indigestion/heartburn, muscle weakness, difficulty walking, abnormal bleeding, and breast masses.    Physical Exam  General:  alert, well-developed, well-nourished, and well-hydrated.   Head:  normocephalic and atraumatic.  Pagtient with exquisite tenderness to percussion over the frontal and maxillary sinus.  Eyes:  C&S clear Ears:  EACs TMs normal Mouth:  Clear Lungs:  Normal respiratory effort, chest expands symmetrically. Lungs are clear to auscultation, no crackles or wheezes. Heart:  normal rate and regular rhythm.   Skin:  turgor normal and color normal.   Psych:  Oriented X3, memory intact for recent and remote,  normally interactive, and not anxious appearing.     Impression & Recommendations:  Problem # 1:  SINUSITIS- ACUTE-NOS (ICD-461.9)  Patient with acute sinus tenderness and feeling bad. NO fever. Suspect sinusitis.  Plan - Augmentin 875 mg two times a day  x 7           sudafed 30mg  three times a day           nasal saline wash during the day           APAP for discomfort.  Her updated medication list for this problem includes:    Amoxicillin-pot Clavulanate 875-125 Mg Tabs (Amoxicillin-pot clavulanate) .Marland Kitchen... 1 by mouth two times a day x 7 for sinus  Complete Medication List: 1)  Pindolol 5 Mg Tabs (Pindolol) .... Take 1/2 tablet daily 2)  Gabapentin 300 Mg Caps (Gabapentin) .... Take 1 capsule by mouth three times a day 3)  Hydrocodone-acetaminophen 7.5-750 Mg Tabs (Hydrocodone-acetaminophen) .... Take as needed 4)  Mag-oxide 400 Mg Tabs (Magnesium oxide) .Marland Kitchen.. 1 tablet by mouth two times a day-  pt has not been taking 5)  Nexium 20 Mg Cpdr (Esomeprazole magnesium) .... Take one capsule once daily 6)  Fluoxetine Hcl 20 Mg Caps (Fluoxetine hcl) .Marland Kitchen.. 1 by mouth once daily 7)  Amoxicillin-pot Clavulanate 875-125 Mg Tabs (Amoxicillin-pot clavulanate) .Marland Kitchen.. 1 by mouth two times a day x 7 for sinus  Patient Instructions: 1)  symptoms and exam suggest an acute sinus infection. Plan - amox/clav 875 two times a day for 7 days for infection; behind the counter sudafed(generic) 30mg  three times a day; nasal saline spray frequently during the day; tylenol as needed for pain and discomfort. Prescriptions: AMOXICILLIN-POT CLAVULANATE 875-125 MG TABS (AMOXICILLIN-POT CLAVULANATE) 1 by mouth two times a day x 7 for sinus  #14 x 0   Entered and Authorized by:   Jacques Navy MD   Signed by:   Jacques Navy MD on 05/11/2010   Method used:   Electronically to        [facility] (retail)       [address]       [place], [place]  [address]       Ph: [phone]       Fax: [phone]   RxID:   [id number]

## 2010-11-28 NOTE — Progress Notes (Signed)
Summary: QUESTION FOR NURSE   Phone Note Call from Patient Call back at Home Phone [phone]   Call For: DR Shaina Gullatt Reason for Call: Talk to Nurse Summary of Call: WANTS TO KNOW WHICH SIDE OF THE STOMACH HER COLON IS ON. Initial call taken by: Leanor Kail [facility],  June 30, 2008 9:25 AM  Follow-up for Phone Call        attempted call back, no machine, no answer, will try later. Darcey Nora RN  June 30, 2008 10:59 AM  spoke with patient, she states she has a pain that she has high on her left side, but only when lying down, when she stands up for awhile and walks around the pain goes away.  Her pain  MD wanted to do an x-ray , but she declined last week.  I advised her that it sounded like musculoskeletal or neurological.  Pt states that she has multilple bulging disks in back.  She will return to her pain MD for x-ray.  Patient instructed to call back for further questions, problems, or concerns Follow-up by: Darcey Nora RN,  June 30, 2008 1:48 PM  Additional Follow-up for Phone Call Additional follow up Details #1::        Agree. She should see her PCP and pain MD for further evaluation. If they feel she needs GI evaluation she should schedule an appt. Additional Follow-up by: Meryl Dare MD Clementeen Graham,  June 30, 2008 2:00 PM

## 2010-11-28 NOTE — Miscellaneous (Signed)
Summary: LABS CMP,LIPIDS,05/19/2009  Clinical Lists Changes  Observations: Added new observation of CALCIUM: 9.2 mg/dL (11/91/4782 95:62) Added new observation of ALBUMIN: 4.2 g/dL (13/05/6577 46:96) Added new observation of PROTEIN, TOT: 7.4 g/dL (29/52/8413 24:40) Added new observation of SGPT (ALT): 13 units/L (05/19/2009 16:56) Added new observation of SGOT (AST): 14 units/L (05/19/2009 16:56) Added new observation of ALK PHOS: 58 units/L (05/19/2009 16:56) Added new observation of GFR AA: >60 mL/min/1.18m2 (05/19/2009 16:56) Added new observation of GFR: >60 mL/min (05/19/2009 16:56) Added new observation of CREATININE: 0.82 mg/dL (08/25/2535 64:40) Added new observation of BUN: 11 mg/dL (34/74/2595 63:87) Added new observation of BG RANDOM: 86 mg/dL (56/43/3295 18:84) Added new observation of CO2 PLSM/SER: 20 meq/L (05/19/2009 16:56) Added new observation of CL SERUM: 105 meq/L (05/19/2009 16:56) Added new observation of K SERUM: 4.4 meq/L (05/19/2009 16:56) Added new observation of NA: 137 meq/L (05/19/2009 16:56) Added new observation of LDL: 68 mg/dL (16/60/6301 60:10) Added new observation of HDL: 44 mg/dL (93/23/5573 22:02) Added new observation of TRIGLYC TOT: 162 mg/dL (54/27/0623 76:28) Added new observation of CHOLESTEROL: 144 mg/dL (31/51/7616 07:37)

## 2010-11-28 NOTE — Assessment & Plan Note (Signed)
Summary: ec6./ palp./gd  Medications Added GABAPENTIN 300 MG CAPS (GABAPENTIN) take one capsule daily NEXIUM 20 MG CPDR (ESOMEPRAZOLE MAGNESIUM) take one capsule once daily      Allergies Added: NKDA  Referring Provider:  Neurosurgery-[facility]; [facility] Primary Provider:  [name]  CC:  pt reports palpitations.  .  History of Present Illness:    Mrs. Durnell is seen at the request of Dr. Debby Bud after a hiatus of about 4 years. She is a long-standing history of palpitations which in the past and more recently in 2009 were defined as being associated with PVCs. 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 She has been treated with Sectral and she had a problem with mild bradycardia. Previously she was on atenolol.  Stress echo undertaken for chest pain in 2009 was negative The patient describes these palpitations as becoming increasingly frequent and increasingly annoying. They occur almost every day.She does not use caffeine. She does use some chocolate. This may aggravate his son. She does not use over-the-counter cold medicines. She notes that with the onset of exertion definitely triggered but with further exertion they are suppressed.  Sectralhas been used as noted. A titration to 3 times a day has not been associated with any improvement in  Current Medications (verified): 1)  Acebutolol Hcl 200 Mg Caps (Acebutolol Hcl) .Marland Kitchen.. 1 By Mouth Three Times A Day 2)  Gabapentin 300 Mg Caps (Gabapentin) .... Take One Capsule Daily 3)  Hydrocodone-Acetaminophen 7.5-750 Mg Tabs (Hydrocodone-Acetaminophen) .... Take As Needed 4)  Mag-Oxide 400 Mg Tabs (Magnesium Oxide) .Marland Kitchen.. 1 Tablet By Mouth Two Times A Day 5)  Nexium 20 Mg Cpdr (Esomeprazole Magnesium) .... Take One Capsule Once Daily  Allergies (verified): No Known Drug Allergies  Past History:  Past Medical History: Last updated:  11/09/2009 PALPITATIONS, HX OF (ICD-V12.50); history of PVCs Chest pain-negative stress echocardiogram in 2009 Tobacco abuse: Discontinued in 2005 and 12/08 MIGRAINE HEADACHE (ICD-346.90) GASTROESOPHAGEAL REFLUX DISEASE (ICD-530.81); gastroparesis DEGENERATIVE DISC DISEASE, CERVICAL SPINE, 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)-status post surgical intervention x3 Depression  Past Surgical History: Last updated: 11/09/2009 C3-5 DECOMPRESSIVE laminectomy and fusion-1999 +2 additional surgical procedures TUBAL LIGATION, HX OF (ICD-V26.51) TOTAL ABDOMINAL HYSTERECTOMY, HX OF (ICD-V45.77)-1996 CHOLECYSTECTOMY, HX OF (ICD-V45.79)-1995     Family History: Last updated: 11/09/2009 Mother had multiple sclerosis Father had cardiac disease No FH of Colon Cancer: Family History of Diabetes: mother  Social History: Last updated: 11/09/2009 Occupation: retired [employer] Married Alcohol use-no Drug use-no; no caffeine Tobacco Use - Yes.  quit dec 08 Regular Exercise - no Drug Use - no 4 CHILDREN; reports increased stress in due to caring for grandchildren UNEMPLOYED  Vital Signs:  Patient profile:   50 year old female Height:      68 inches Weight:      182 pounds BMI:     27.77 Pulse rate:   59 / minute Pulse rhythm:   regular BP sitting:   128 / 76  (left arm) Cuff size:   regular  Vitals Entered By: Judithe Modest CMA (February 06, 2010 2:03 PM)  Physical Exam  General:  Well developed, well nourished the latest American female appearing her stated age in no acute distress. Head:  HEENT is normal Neck:  supple without thyromegaly neckveins 7-8 cm There is a scar on her right anterior neck Heart:  lungs were clearheart sounds are regular without murmurs or gallops there was an S4  Abdomen:  soft with active bowel sounds midline pulsation or hepatomegaly Msk:  Back normal, normal gait. Muscle strength and tone normal. Pulses:  intake distal pulses femoral pulses were  not examined Extremities:  No clubbing or cyanosis.no edema Neurologic:  Alert and oriented x 3. 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 We discussed treatment options including antiarrhythmic drug therapy versus catheter ablation. I would prefer the former given the vagaries of trying to induce not all that frequent PVCs. To that end we'll undertake a Myoview scan in any event if this is negative would proceed with flecainide therapy at 100 mg twice daily. We will follow her along symptomatically and deferred decision for catheter ablation for the present time. Her updated medication list for this problem includes:    Acebutolol Hcl 200 Mg Caps (Acebutolol hcl) .Marland Kitchen... 1 by mouth three times a day  Problem # 2:  ELECTROCARDIOGRAM, ABNORMAL (ICD-794.31) I am bothered a little bit by the T wave inversion in V 3 and V4. He raises the possibility of ARV C.  at some point we'll probably anticipate a signal average left cardiogram tried to identify whether she has underlying Her updated medication list for this problem includes:    Acebutolol Hcl 200 Mg Caps (Acebutolol hcl) .Marland Kitchen... 1 by mouth three times a day  Other Orders: EKG w/ Interpretation (93000) Nuclear Stress Test (Nuc Stress Test)  Patient Instructions: 1)  Your physician recommends that you schedule a follow-up appointment in: 3 MONTHS 2)  Your physician has requested that you have an exercise stress myoview.  For further information please visit [email or web address]  Please follow instruction sheet, as given.

## 2010-11-28 NOTE — Miscellaneous (Signed)
Summary: LABS CMP,LIVER,05/19/2009  Clinical Lists Changes  Observations: Added new observation of CALCIUM: 9.2 mg/dL (64/40/3474 25:95) Added new observation of ALBUMIN: 4.2 g/dL (63/87/5643 32:95) Added new observation of PROTEIN, TOT: 7.4 g/dL (18/84/1660 63:01) Added new observation of SGPT (ALT): 13 units/L (05/19/2009 12:02) Added new observation of SGOT (AST): 14 units/L (05/19/2009 12:02) Added new observation of ALK PHOS: 58 units/L (05/19/2009 12:02) Added new observation of GFR AA: >60 mL/min/1.35m2 (05/19/2009 12:02) Added new observation of GFR: >60 mL/min (05/19/2009 12:02) Added new observation of CREATININE: 0.82 mg/dL (60/07/9322 55:73) Added new observation of BUN: 11 mg/dL (22/11/5425 06:23) Added new observation of BG RANDOM: 86 mg/dL (76/28/3151 76:16) Added new observation of CO2 PLSM/SER: 20 meq/L (05/19/2009 12:02) Added new observation of CL SERUM: 105 meq/L (05/19/2009 12:02) Added new observation of K SERUM: 4.4 meq/L (05/19/2009 12:02) Added new observation of NA: 137 meq/L (05/19/2009 12:02) Added new observation of LDL: 68 mg/dL (07/37/1062 69:48) Added new observation of HDL: 44 mg/dL (54/62/7035 00:93) Added new observation of TRIGLYC TOT: 162 mg/dL (81/82/9937 16:96) Added new observation of CHOLESTEROL: 144 mg/dL (78/93/8101 75:10)

## 2010-11-28 NOTE — Assessment & Plan Note (Signed)
Summary: COLD SX/NO FEVER/$50/CD   Vital Signs:  Patient Profile:   50 Years Old Female Weight:      170 pounds O2 Sat:      97 % O2 treatment:    Room Air Temp:     98.5 degrees F oral Pulse rate:   64 / minute BP sitting:   122 / 80  (left arm) Cuff size:   regular  Vitals Entered By: Payton Spark CMA (October 20, 2008 2:45 PM)                 Chief Complaint:  COLD SX'S X FEW DAYS.  History of Present Illness: here with acute onset severe facial pain, fever, greenish d/c and pressure for 3 days, as well as marked left earache; no chills, dizzy, nausea or vomiting    Updated Prior Medication List: ATENOLOL 50 MG  TABS (ATENOLOL) Take one tablet once daily GABAPENTIN 300 MG  TABS (GABAPENTIN) Take one tablet 3 times daily NEXIUM 40 MG  CPDR (ESOMEPRAZOLE MAGNESIUM) Take one tablet once daily HYDROCODONE-ACETAMINOPHEN 5-500 MG  TABS (HYDROCODONE-ACETAMINOPHEN) Take as needed DIAZEPAM 2 MG TABS (DIAZEPAM) Take 1 tablet by mouth three times a day NASONEX 50 MCG/ACT SUSP (MOMETASONE FUROATE) 2 puffs each nostril q. day SINGULAIR 10 MG TABS (MONTELUKAST SODIUM) once daily CEPHALEXIN 500 MG CAPS (CEPHALEXIN) 1po three times a day TESSALON PERLES 100 MG CAPS (BENZONATATE) 1 - 2 by mouth three times a day as needed  Current Allergies (reviewed today): No known allergies   Past Medical History:    Reviewed history from 01/16/2008 and no changes required:       URI (ICD-465.9)       MIGRAINE HEADACHE (ICD-346.90)       PALPITATIONS, HX OF (ICD-V12.50)       GASTROESOPHAGEAL REFLUX DISEASE (ICD-530.81)       DEGENERATIVE DISC DISEASE, CERVICAL SPINE, 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)         Past Surgical History:    Reviewed history from 07/24/2007 and no changes required:       * C3-5 DECOMPRESSIVE LAM AND FUSION       TUBAL LIGATION, HX OF (ICD-V26.51)       TOTAL ABDOMINAL HYSTERECTOMY, HX OF (ICD-V45.77)       CHOLECYSTECTOMY, HX OF (ICD-V45.79)                  Social  History:    Reviewed history from 07/12/2008 and no changes required:       Occupation:       Married       Alcohol use-no       Drug use-no    Review of Systems       all otherwise negative    Physical Exam  General:     alert and overweight-appearing.  , mild ill  Head:     Normocephalic and atraumatic without obvious abnormalities. No apparent alopecia or balding. Eyes:     No corneal or conjunctival inflammation noted. EOMI. Perrla.  Ears:     left tm severe red, sinus tender bilat Nose:     nasal dischargemucosal pallor and mucosal erythema.   Mouth:     pharyngeal erythema and fair dentition.   Neck:     supple and cervical lymphadenopathy.   Lungs:     Normal respiratory effort, chest expands symmetrically. Lungs are clear to auscultation, no crackles or wheezes. Heart:     Normal rate and regular rhythm. S1 and S2  normal without gallop, murmur, click, rub or other extra sounds. Extremities:     no edema, no ulcers     Impression & Recommendations:  Problem # 1:  SINUSITIS- ACUTE-NOS (ICD-461.9)  Her updated medication list for this problem includes:    Nasonex 50 Mcg/act Susp (Mometasone furoate) .Marland Kitchen... 2 puffs each nostril q. day    Cephalexin 500 Mg Caps (Cephalexin) .Marland Kitchen... 1po three times a day    Tessalon Perles 100 Mg Caps (Benzonatate) .Marland Kitchen... 1 - 2 by mouth three times a day as needed with left ear involvement - treat as above, f/u any worsening signs or symptoms , can also use mucinex otc  Complete Medication List: 1)  Atenolol 50 Mg Tabs (Atenolol) .... Take one tablet once daily 2)  Gabapentin 300 Mg Tabs (Gabapentin) .... Take one tablet 3 times daily 3)  Nexium 40 Mg Cpdr (Esomeprazole magnesium) .... Take one tablet once daily 4)  Hydrocodone-acetaminophen 5-500 Mg Tabs (Hydrocodone-acetaminophen) .... Take as needed 5)  Diazepam 2 Mg Tabs (Diazepam) .... Take 1 tablet by mouth three times a day 6)  Nasonex 50 Mcg/act Susp (Mometasone furoate)  .... 2 puffs each nostril q. day 7)  Singulair 10 Mg Tabs (Montelukast sodium) .... Once daily 8)  Cephalexin 500 Mg Caps (Cephalexin) .Marland Kitchen.. 1po three times a day 9)  Tessalon Perles 100 Mg Caps (Benzonatate) .Marland Kitchen.. 1 - 2 by mouth three times a day as needed   Patient Instructions: 1)  Please take all new medications as prescribed 2)  Continue all medications that you may have been taking previously 3)  you can also use Mucinex OTC for congestion 4)  Please schedule an appointment with your primary doctor as needed   Prescriptions: TESSALON PERLES 100 MG CAPS (BENZONATATE) 1 - 2 by mouth three times a day as needed  #50 x 1   Entered and Authorized by:   Corwin Levins MD   Signed by:   Corwin Levins MD on 10/20/2008   Method used:   Print then Give to Patient   RxID:   [id number] CEPHALEXIN 500 MG CAPS (CEPHALEXIN) 1po three times a day  #30 x 0   Entered and Authorized by:   Corwin Levins MD   Signed by:   Corwin Levins MD on 10/20/2008   Method used:   Print then Give to Patient   RxID:   [id number]  ]

## 2010-11-28 NOTE — Assessment & Plan Note (Signed)
Summary: anxiety/#/cd   Vital Signs:  Patient profile:   50 year old female Height:      70 inches Weight:      174 pounds BMI:     25.06 O2 Sat:      98 % on Room air Temp:     97.8 degrees F oral Pulse rate:   58 / minute BP sitting:   122 / 72  (left arm) Cuff size:   regular  Vitals Entered By: [name] CMA (April 13, 2010 3:30 PM)  O2 Flow:  Room air CC: pt here for ov to discuss anxiety/ ab   Primary Care Provider:  Micheal Teondra Newburg,MD  CC:  pt here for ov to discuss anxiety/ ab.  History of Present Illness: Patient saw Dr. Graciela Husbands in April - he ordered a nuclear stress study that was normal. Dr. Wilmon Arms plan was for her to take flecanide to control her PVCs, however, she was freightened by the number of potential side effects and declined medication. She will discuss treatment options with Dr. Clide Cliff in July.  She is eaten up with anxiety: she will get tremulous, short of breath, panicky. She will have symptoms several times a week. This is new over the last week or so.  It is worse when she is alone. She continues to have a lot of neck pain and she continues with her pain specialist. She had this in the past and was treated with valium 5mg .   Current Medications (verified): 1)  Acebutolol Hcl 200 Mg Caps (Acebutolol Hcl) .Marland Kitchen.. 1 By Mouth Three Times A Day 2)  Gabapentin 300 Mg Caps (Gabapentin) .... Take One Capsule Daily 3)  Hydrocodone-Acetaminophen 7.5-750 Mg Tabs (Hydrocodone-Acetaminophen) .... Take As Needed 4)  Mag-Oxide 400 Mg Tabs (Magnesium Oxide) .Marland Kitchen.. 1 Tablet By Mouth Two Times A Day 5)  Nexium 20 Mg Cpdr (Esomeprazole Magnesium) .... 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  quit dec 08 Regular Exercise - no Drug Use - no 4 CHILDREN; reports increased stress in due to caring for grandchildren UNEMPLOYED  Review of Systems  The patient denies anorexia, fever, weight loss, weight gain, syncope, dyspnea on exertion, headaches, hemoptysis, abdominal pain, muscle weakness, difficulty walking, depression, and enlarged lymph nodes.    Physical Exam  General:  alert, well-developed, and well-nourished.   Head:  normocephalic and no abnormalities observed.   Eyes:  pupils equal and pupils round.   Lungs:  normal respiratory effort and normal breath sounds.   Heart:  normal rate, regular rhythm, and no murmur.     Impression & Recommendations:  Problem # 1:  ANXIETY STATE, UNSPECIFIED (ICD-300.00) Patient with chronic and situational anxiety over the past  week.  Plan - chronic mgt of anxiety with SSRI - fluoxetine.            ROV 4 weeks to monitor response to medication.  Her updated medication list for this problem includes:    Fluoxetine Hcl 20 Mg Caps (Fluoxetine hcl) .Marland Kitchen... 1 by mouth once daily  Complete Medication List: 1)  Acebutolol Hcl 200 Mg Caps (Acebutolol hcl) .Marland Kitchen.. 1 by  mouth three times a day 2)  Gabapentin 300 Mg Caps (Gabapentin) .... Take one capsule daily 3)  Hydrocodone-acetaminophen 7.5-750 Mg Tabs (Hydrocodone-acetaminophen) .... Take as needed 4)  Mag-oxide 400 Mg Tabs (Magnesium oxide) .Marland Kitchen.. 1 tablet by mouth two times a day 5)  Nexium 20 Mg Cpdr (Esomeprazole magnesium) .... Take one capsule once daily 6)  Fluoxetine Hcl 20 Mg Caps (Fluoxetine hcl) .Marland Kitchen.. 1 by mouth once daily Prescriptions: FLUOXETINE HCL 20 MG CAPS (FLUOXETINE HCL) 1 by mouth once daily  #30 x 12   Entered and Authorized by:   Jacques Navy MD   Signed by:   Lamar Sprinkles, CMA on 04/14/2010   Method used:   Electronically to        [facility] (retail)       [address]       [place], [place]  [address]       Ph: [phone]       Fax: [phone]   RxID:   [id number]

## 2010-11-28 NOTE — Assessment & Plan Note (Signed)
Summary: tingling feet,numbness for a while/cd   Vital Signs:  Patient profile:   50 year old female Height:      68 inches Weight:      180 pounds BMI:     27.47 O2 Sat:      99 % on Room air Temp:     98.0 degrees F oral Pulse rate:   66 / minute BP sitting:   122 / 80  (left arm) Cuff size:   regular  Vitals Entered By: [name] CMA (November 28, 2009 4:19 PM)  O2 Flow:  Room air CC: pt c/o "nervous feeling" in her chest, she takes albutolol and has history of palpatations but states that med she feels is not helping/ ab   Primary Care Provider:  [name],MD  CC:  pt c/o "nervous feeling" in her chest and she takes albutolol and has history of palpatations but states that med she feels is not helping/ ab.  History of Present Illness: Patient presents for a quivering discomfort in her chest. She has had this for a long time. She had a heart monitor 6 years ago and had a repeat 1 year ago and this was also normal. When she has the quivering in the chest she will get light headed but no syncope,  she does have some mild chest pain. She also has  mild paresthesia in the hands. she continues to see the pain specialist for management of cervical related pain   Current Medications (verified): 1)  Acebutolol Hcl 200 Mg Caps (Acebutolol Hcl) .Marland Kitchen.. 1 By Mouth Three Times A Day 2)  Gabapentin 300 Mg  Tabs (Gabapentin) .... Take One Tablet 3 Times Daily 3)  Prevacid 15 Mg Cpdr (Lansoprazole) .Marland Kitchen.. 1 By Mouth Two Times A Day 4)  Hydrocodone-Acetaminophen 7.5-750 Mg Tabs (Hydrocodone-Acetaminophen) .... Take As Needed  Allergies (verified): No Known Drug Allergies  Past History:  Past Medical History: Last updated: 11/09/2009 PALPITATIONS, HX OF (ICD-V12.50); history of PVCs Chest pain-negative stress echocardiogram in 2009 Tobacco abuse: Discontinued in 2005 and 12/08 MIGRAINE HEADACHE (ICD-346.90) GASTROESOPHAGEAL REFLUX DISEASE (ICD-530.81); gastroparesis DEGENERATIVE DISC  DISEASE, CERVICAL SPINE, 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)-status post surgical intervention x3 Depression  Past Surgical History: Last updated: 11/09/2009 C3-5 DECOMPRESSIVE laminectomy and fusion-1999 +2 additional surgical procedures TUBAL LIGATION, HX OF (ICD-V26.51) TOTAL ABDOMINAL HYSTERECTOMY, HX OF (ICD-V45.77)-1996 CHOLECYSTECTOMY, HX OF (ICD-V45.79)-1995     Family History: Last updated: 11/09/2009 Mother had multiple sclerosis Father had cardiac disease No FH of Colon Cancer: Family History of Diabetes: mother  Social History: Last updated: 11/09/2009 Occupation: retired [employer] Married Alcohol use-no Drug use-no; no caffeine Tobacco Use - Yes.  quit dec 08 Regular Exercise - no Drug Use - no 4 CHILDREN; reports increased stress in due to caring for grandchildren UNEMPLOYED  Risk Factors: Exercise: no (03/17/2009)  Risk Factors: Smoking Status: current (03/17/2009) Passive Smoke Exposure: no (07/12/2008)  Review of Systems       The patient complains of chest pain.  The patient denies anorexia, fever, weight loss, weight gain, syncope, dyspnea on exertion, peripheral edema, abdominal pain, difficulty walking, and depression.    Physical Exam  General:  WNWD, heavyset AA female in no distress Head:  normocephalic and atraumatic.   Neck:  supple.   Lungs:  normal respiratory effort and normal breath sounds.   Heart:  wtih ausculatation she has frequent PVCs, bigiminy and trigminy. No murmur was noted.  Psych:  Oriented X3, memory intact for recent and remote,  normally interactive, and good eye contact.     Impression & Recommendations:  Problem # 1:  PALPITATIONS, HX OF (ICD-V12.50) Patient continues to have symptomatic palpitations and on exam did have frequent PVCs and trigminy, bigiminy. She reports that she felt uncomfortable when this occurred. This despite being on sectral 300mg  three times a day.  Plan - will defer to Dr. Dietrich Pates  as to any changes in medication.   Complete Medication List: 1)  Acebutolol Hcl 200 Mg Caps (Acebutolol hcl) .Marland Kitchen.. 1 by mouth three times a day 2)  Gabapentin 300 Mg Tabs (Gabapentin) .... Take one tablet 3 times daily 3)  Lansoprazole 30 Mg Cpdr (Lansoprazole) .Marland Kitchen.. 1 by mouth once daily 4)  Hydrocodone-acetaminophen 7.5-750 Mg Tabs (Hydrocodone-acetaminophen) .... Take as needed

## 2010-11-28 NOTE — Assessment & Plan Note (Signed)
Summary: per pt phone call for palpitations/seen Dr.Norrins 1/11/tg  Medications Added MAG-OXIDE 400 MG TABS (MAGNESIUM OXIDE) 1 tablet by mouth two times a day      Allergies Added: NKDA  Visit Type:  Follow-up Referring Provider:  Neurosurgery-Hirsch; [facility] Primary Provider:  [name]  CC:  NO COMPLAINTS TODAY.  History of Present Illness: [name] is a pleasant 50 y/o AAF who we are seeing on follow-up at the request of Dr. Debby Bud for palpatations. She has had these complaints for 3 years and multiple medications have been tried, but have not been sucessful.  She remains on sectral 200mg  three times a day, but often has "breakthrough" palpatations despite the medication. She was seen by Dr. Debby Bud 11/28/2009 and he documented bigeminy, trigeminy and frequent PVC's.  She felt relieved that he saw them and auculated them, as she has been feeling as no on believed her symptoms.  She has been on holter monitors and cardionet in the past which failed to record these arrythmias.  She had seen Dr. Graciela Husbands in the past and was to have EP study, but did not follow-through after cardiology follow-up with Dr. Dietrich Pates who preferred to treat her with medications.  She wishes to now see Dr. Graciela Husbands again for rediscussion of an EP study.  She denies pain associated with the arrythmias, but states they make her feel bad, and kind of "blah" when they occur.  Current Medications (verified): 1)  Acebutolol Hcl 200 Mg Caps (Acebutolol Hcl) .Marland Kitchen.. 1 By Mouth Three Times A Day 2)  Gabapentin 300 Mg  Tabs (Gabapentin) .... Take One Tablet 3 Times Daily 3)  Hydrocodone-Acetaminophen 7.5-750 Mg Tabs (Hydrocodone-Acetaminophen) .... Take As Needed 4)  Mag-Oxide 400 Mg Tabs (Magnesium Oxide) .Marland Kitchen.. 1 Tablet By Mouth Two Times A Day  Allergies (verified): No Known Drug Allergies  Past History:  Past medical, surgical, family and social histories (including risk factors) reviewed, and no changes noted  (except as noted below).  Past Medical History: Reviewed history from 11/09/2009 and no changes required. PALPITATIONS, HX OF (ICD-V12.50); history of PVCs Chest pain-negative stress echocardiogram in 2009 Tobacco abuse: Discontinued in 2005 and 12/08 MIGRAINE HEADACHE (ICD-346.90) GASTROESOPHAGEAL REFLUX DISEASE (ICD-530.81); gastroparesis DEGENERATIVE DISC DISEASE, CERVICAL SPINE, 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)-status post surgical intervention x3 Depression  Past Surgical History: Reviewed history from 11/09/2009 and no changes required. C3-5 DECOMPRESSIVE laminectomy and fusion-1999 +2 additional surgical procedures TUBAL LIGATION, HX OF (ICD-V26.51) TOTAL ABDOMINAL HYSTERECTOMY, HX OF (ICD-V45.77)-1996 CHOLECYSTECTOMY, HX OF (ICD-V45.79)-1995     Family History: Reviewed history from 11/09/2009 and no changes required. Mother had multiple sclerosis Father had cardiac disease No FH of Colon Cancer: Family History of Diabetes: mother  Social History: Reviewed history from 11/09/2009 and no changes required. Occupation: retired [employer] Married Alcohol use-no Drug use-no; no caffeine Tobacco Use - Yes.  quit dec 08 Regular Exercise - no Drug Use - no 4 CHILDREN; reports increased stress in due to caring for grandchildren UNEMPLOYED  Review of Systems       Palpatations occuring intermitantly, with no precipitating or allieviating activities.  All other systems have been reviewed and are negative unless stated above.   Vital Signs:  Patient profile:   50 year old female Weight:      173 pounds Pulse rate:   58 / minute BP sitting:   124 / 74  (right arm)  Vitals Entered By: Dreama Saa, CNA (December 16, 2009 2:01 PM)  Physical Exam  General:  Well  developed, well nourished, in no acute distress. Lungs:  Clear bilaterally to auscultation and percussion. Heart:  Non-displaced PMI, chest non-tender; regular rate and rhythm, S1, S2 without  murmurs, rubs or gallops. Carotid upstroke normal, no bruit. Normal abdominal aortic size, no bruits. Femorals normal pulses, no bruits. Pedals normal pulses. No edema, no varicosities. Abdomen:  Bowel sounds positive; abdomen soft and non-tender without masses, organomegaly, or hernias noted. No hepatosplenomegaly. Msk:  Back normal, normal gait. Muscle strength and tone normal. Psych:  Normal affect.   EKG  Procedure date:  12/16/2009  Findings:      Sinus bradycardia with rate of:  59bpm  Impression & Recommendations:  Problem # 1:  PALPITATIONS (ICD-785.1) She has been seen by Dr. Debby Bud who has documented trigeminy, bigeminy and PVC's.  We will refer her back to Dr. Roney Jaffe who saw her in the past for further evlaluation. She will continue the acebutolol for now and will add magnesium sulfate 400mg  two times a day.  Her updated medication list for this problem includes:    Acebutolol Hcl 200 Mg Caps (Acebutolol hcl) .Marland Kitchen... 1 by mouth three times a day  Problem # 2:  CHEST PAIN (ICD-786.50) Assessment: Improved  Her updated medication list for this problem includes:    Acebutolol Hcl 200 Mg Caps (Acebutolol hcl) .Marland Kitchen... 1 by mouth three times a day  Other Orders: Cardiology Referral (Cardiology)  Patient Instructions: 1)  Your physician recommends that you schedule a follow-up appointment with Dr. Graciela Husbands (we will schedule) 2)  Your physician has recommended you make the following change in your medication: Start taking Mag-Oxide 400mg  by mouth two times a day  Prescriptions: MAG-OXIDE 400 MG TABS (MAGNESIUM OXIDE) 1 tablet by mouth two times a day  #60 x 1   Entered by:   Larita Fife Via LPN   Authorized by:   Joni Reining, NP   Signed by:   Larita Fife Via LPN on 16/07/9603   Method used:   Electronically to        [facility] (retail)       [address]       [place], [place]  [address]       Ph: [phone]       Fax: [phone]   RxID:    [id number]

## 2010-11-28 NOTE — Assessment & Plan Note (Signed)
Summary: 3 MONTH ROV.SL  Medications Added PINDOLOL 5 MG TABS (PINDOLOL) Take 1/2 tablet daily GABAPENTIN 300 MG CAPS (GABAPENTIN) Take 1 capsule by mouth three times a day MAG-OXIDE 400 MG TABS (MAGNESIUM OXIDE) 1 tablet by mouth two times a day-  pt has not been taking      Allergies Added: NKDA  Referring Provider:  Neurosurgery-[facility]sch; [facility] Primary Provider:  [name]  CC:  3 month rov.  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 She has a history of bradycardia and thus has been  treated with Sectral. She has continued to have symptoms.    At our last visit we elected to pursue Myoview scanning with the intention of using flecainide for arrhythmia suppression. However, she chose following the normal Myoview scan not to proceed with antiarrhythmic drugs because of concerns of pro arrhythmia.    n  Current Medications (verified): 1)  Acebutolol Hcl 200 Mg Caps (Acebutolol Hcl) .Marland Kitchen.. 1 By Mouth Three Times A Day 2)  Gabapentin 300 Mg Caps (Gabapentin) .... Take 1 Capsule By Mouth Three Times A Day 3)  Hydrocodone-Acetaminophen 7.5-750 Mg Tabs (Hydrocodone-Acetaminophen) .... Take As Needed 4)  Mag-Oxide 400 Mg Tabs (Magnesium Oxide) .Marland Kitchen.. 1 Tablet By Mouth Two Times A Day-  Pt Has Not Been Taking 5)  Nexium 20 Mg Cpdr (Esomeprazole Magnesium) .... Take One Capsule Once Daily 6)  Fluoxetine Hcl 20 Mg Caps (Fluoxetine Hcl) .Marland Kitchen.. 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  quit dec 08 Regular Exercise - no Drug Use - no 4 CHILDREN; reports increased stress in due to caring for grandchildren UNEMPLOYED  Vital Signs:  Patient profile:   50 year old female Height:      70 inches Weight:      180 pounds BMI:     25.92 Pulse rate:   56 / minute Pulse rhythm:   regular BP sitting:   132 / 78  (right arm) Cuff size:   regular  Vitals Entered By: Judithe Modest CMA (May 11, 2010 9:46 AM)  Physical Exam  General:  The patient was alert and oriented in no acute distress. HEENT Normal.  Neck veins were flat, carotids were brisk.  Lungs were clear.  Heart sounds were regular without murmurs or gallops.  Abdomen was soft with active bowel sounds. There is no clubbing cyanosis or edema. Skin Warm and dry     EKG  Procedure date:  05/11/2010  Findings:      sinus rhythm at 54 Intervals 0.17/0.08/24 2 Axis is 50  Impression & Recommendations:  Problem # 1:  PREMATURE VENTRICULAR CONTRACTIONS (ICD-427.69) her PVCs continued to be a  problem. Her bradycardia makes our choices of medications limited. We will try a different ISA-beta blocker using low dose pindolol.  In the event that she continues to have problems, we will have to decide whether there is sufficient symptoms to justify in her mind using an antiarrhythmic drug. Her updated medication list for this  problem includes:    Pindolol 5 Mg Tabs (Pindolol) .Marland Kitchen... 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 This may be contributing to some of her fatigue and dizziness Her updated medication list for this problem includes:    Pindolol 5 Mg Tabs (Pindolol) .Marland Kitchen... Take 1/2 tablet daily  Patient Instructions: 1)  Your physician has recommended you make the following change in your medication: Stop Acebutalol.  Start Pindolol 5mg  1/2 tablet daily. 2)  Your physician wants you to follow-up in:6 months with Dr Graciela Husbands.   You will receive a reminder letter in the mail two months in advance. If you don't receive a letter, please call our office to schedule the follow-up appointment. Prescriptions: PINDOLOL 5 MG TABS (PINDOLOL) Take 1/2 tablet daily  #15 x 11   Entered by:   Optometrist BSN   Authorized by:   Nathen May, MD, [facility]   Signed by:   Gypsy Balsam RN BSN on 05/11/2010   Method used:   Electronically to        [facility] (retail)       [address]       [place], [place]  [address]       Ph: [phone]       Fax: [phone]   RxID:   [id number]

## 2010-11-28 NOTE — Assessment & Plan Note (Signed)
Summary: post hosp [facility] on 7/31/cd   Vital Signs:  Patient profile:   50 year old female Height:      70 inches Weight:      178 pounds BMI:     25.63 O2 Sat:      98 % on Room air Temp:     98.0 degrees F oral Pulse rate:   76 / minute BP sitting:   118 / 82  (left arm) Cuff size:   regular  Vitals Entered By: [name] CMA (May 31, 2010 11:10 AM)  O2 Flow:  Room air CC: hosp follow up/ ab   Primary Care Provider:  Micheal Kailynne Ferrington,MD  CC:  hosp follow up/ ab.  History of Present Illness: [name] is a 50 year old African-American female with a history of chronic back pain, cardiac arrhythmia, and migraine headache who presented to the Emergency Department and is now for a  follow up. One week and three days ago the patient experienced pain and numbness starting at her upper left shoulder, back and  chest, radiating down her left arm to her fingertips. She rated the severity at 9 out of 10. After a period of 6 days passing without relief she visited the Emergency Department where CXR, CT angiography, EKG, cardiac enzymes and labs were all within normal limits. Cervical x-ray was positive for post operative changes, showing mild left neural foraminal narrowing at C5-6 with more pronounced neural foraminal narrowing on the left at C6-7. The patient was prescribed celebrex but has not taken the medication due to fear of side effects. She has managed her pain with vicodin and has found some relief rating it at 5 out of 10. The pain is also alleviated by resting and precipitated by movement and resistance.  The patient also reports left leg weakness.  The patient is under acute emotional distress due to the recent loss of a very close family member.  Current Medications (verified): 1)  Pindolol 5 Mg Tabs (Pindolol) .... Take 1/2 Tablet Daily 2)  Gabapentin 300 Mg Caps (Gabapentin) .... Take 1 Capsule By Mouth Three Times A Day 3)  Hydrocodone-Acetaminophen 7.5-750 Mg Tabs  (Hydrocodone-Acetaminophen) .... Take As Needed 4)  Mag-Oxide 400 Mg Tabs (Magnesium Oxide) .Marland Kitchen.. 1 Tablet By Mouth Two Times A Day-  Pt Has Not Been Taking 5)  Nexium 20 Mg Cpdr (Esomeprazole Magnesium) .... Take One Capsule Once Daily 6)  Fluoxetine Hcl 20 Mg Caps (Fluoxetine Hcl) .Marland Kitchen.. 1 By Mouth Once Daily  Allergies (verified): No Known Drug Allergies  Family History: Mother had multiple sclerosis Father had cardiac disease Sister has HTN No FH of Colon Cancer: Family History of Diabetes: mother  Social History: Occupation: retired [employer] Department Married Alcohol use-no Drug use-no; no caffeine Tobacco Use - Yes.  quit dec 08 Regular Exercise - no Drug Use - no 4 CHILDREN; reports increased stress in due to caring for grandchildren UNEMPLOYED  Review of Systems       The patient complains of chest pain.  The patient denies vision loss, decreased hearing, and abdominal pain.         c/o redness of the bulbar conjunctiva. She c/o palpitations. C/o nausea. C/o insomnia  Physical Exam  General:  WNWD AA female in no distress Head:  normocephalic and atraumatic.   Eyes:  pupils equal and pupils round.  She had muddy bulbar conjunctiva Lungs:  normal respiratory effort, no intercostal retractions, no accessory muscle use, and no  wheezes.   Msk:   normal ROM, no joint swelling, no joint warmth, no redness over joints, and no joint deformities.   Extremities:  no edema of UE or LE Neurologic:  alert & oriented X3 and cranial nerves II-XII intact.  4/5 proximal and distal left LE strength, 5/5 right LE strength. 4/5 left grip strength, normal left proximal and distal LE strength.  Skin:  turgor normal and color normal.   Psych:  Oriented X3, memory intact for recent and remote, normally interactive, good eye contact, and slightly anxious.     Impression & Recommendations:  Problem # 1:  DEGENERATIVE DISC DISEASE, CERVICAL SPINE, 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) The  patient's left upper arm, chest, back and shoulder pain appears to be due to post operative changes of mild left neural foraminal narrowing at C5-6 with more pronounced neural foraminal narrowing on left at C6-7.  Refer to her pain management clinic for evaluation and consideration for an epidural steroid injection.   Problem # 2:  LEG PAIN, LEFT (ICD-729.5) Proximal and distal left leg weakness 4/5.  Follow up on her next office visit.  Problem # 3:  PREMATURE VENTRICULAR CONTRACTIONS (ICD-427.69) Continue management with her cardiologist Dr. Dietrich Pates.  Problem # 4:  ANXIETY STATE, UNSPECIFIED (ICD-300.00) Patient states Fluoxetine Hcl 20 Mg Caps (Fluoxetine hcl) taken 1 by mouth once daily are making her feel very uncomfortable and wishes to have a medication that would help her during her moments of stress and agitation at that very moment.  plan - D/C Fluoxetine.            Start Alprazolam, take as needed at least 6 hours apart.  Complete Medication List: 1)  Pindolol 5 Mg Tabs (Pindolol) .... Take 1/2 tablet daily 2)  Gabapentin 300 Mg Caps (Gabapentin) .... Take 1 capsule by mouth three times a day 3)  Hydrocodone-acetaminophen 7.5-750 Mg Tabs (Hydrocodone-acetaminophen) .... Take as needed 4)  Mag-oxide 400 Mg Tabs (Magnesium oxide) .Marland Kitchen.. 1 tablet by mouth two times a day-  pt has not been taking 5)  Nexium 20 Mg Cpdr (Esomeprazole magnesium) .... Take one capsule once daily 6)  Fluoxetine Hcl 20 Mg Caps (Fluoxetine hcl) .Marland Kitchen.. 1 by mouth once daily

## 2010-11-28 NOTE — Assessment & Plan Note (Signed)
Summary: SINUS CONGESTION/COUGH/NML      Current Allergies: No known allergies         Complete Medication List: 1)  Atenolol 50 Mg Tabs (Atenolol) .... Take one tablet once daily 2)  Gabapentin 300 Mg Tabs (Gabapentin) .... Take one tablet 3 times daily 3)  Nexium 40 Mg Cpdr (Esomeprazole magnesium) .... Take one tablet once daily 4)  Hydrocodone-acetaminophen 5-500 Mg Tabs (Hydrocodone-acetaminophen) .... Take as needed 5)  Diazepam 2 Mg Tabs (Diazepam) .... Take 1 tablet by mouth three times a day     ]

## 2010-11-28 NOTE — Miscellaneous (Signed)
Summary: Orders Update  Clinical Lists Changes  Orders: Added new Service order of No Show NS50 (NS50) - Signed 

## 2010-11-28 NOTE — Letter (Signed)
Summary: Generic Letter  [facility]  [address] [place], [place] [address]   Phone: [phone]  Fax: [phone]    05/09/2010  [name] [address] [place], [place]  [address]  Dear [name],    We have been unable to reach you regarding your concerns about the medication DR Inaya Gillham prescribed. Please contact our office with any concerns.       Sincerely,   Lamar Sprinkles, CMA [place]) For Dr Judie Petit. [place] Gamero

## 2010-11-28 NOTE — Progress Notes (Signed)
Summary: Fluoxetine alternative/MEN pt  Phone Note Call from Patient Call back at Home Phone [phone] Call back at [phone]   Details of Complaint: [facility] Summary of Call: Patient called stating that she would like alternative for Fluoxetine stating that she does not like the way it makes her feel. Please advise. Initial call taken by: Lucious Groves,  May 05, 2010 1:24 PM  Follow-up for Phone Call        pls see Dr Debby Bud next wk to discuss Follow-up by: Tresa Garter MD,  May 05, 2010 6:05 PM  Additional Follow-up for Phone Call Additional follow up Details #1::        mailbox full on callback #, hm # no answer Additional Follow-up by: Lamar Sprinkles, CMA,  May 05, 2010 6:25 PM    Additional Follow-up for Phone Call Additional follow up Details #2::    She needs an ov to discuss med cha nges Follow-up by: Jacques Navy MD,  May 08, 2010 1:37 PM  Additional Follow-up for Phone Call Additional follow up Details #3:: Details for Additional Follow-up Action Taken: Mailbox is full, unable to leave message on vm. Lucious Groves  May 08, 2010 2:05 PM   left mess to call office back. letter mailed.............Marland KitchenLamar Sprinkles, CMA  May 09, 2010 3:57 PM

## 2010-12-04 ENCOUNTER — Telehealth: Payer: Self-pay | Admitting: Internal Medicine

## 2010-12-11 ENCOUNTER — Ambulatory Visit: Payer: Self-pay

## 2010-12-14 NOTE — Progress Notes (Signed)
  Phone Note Refill Request   Pt reqesting refill on alprazolam, it looks like pt was started on medication when she saw you back on 05/30/2010. Please Advise refill for pt  Initial call taken by: Ami Bullins CMA,  December 04, 2010 10:59 AM  Follow-up for Phone Call        ok for alprazolam 0.5 mg q 6, #120, refill x 5 Follow-up by: Jacques Navy MD,  December 04, 2010 12:57 PM    New/Updated Medications: ALPRAZOLAM 0.5 MG TABS (ALPRAZOLAM) 1 by mouth q6 for depression and anxiety Prescriptions: ALPRAZOLAM 0.5 MG TABS (ALPRAZOLAM) 1 by mouth q6 for depression and anxiety  #120 x 5   Entered by:   Ami Bullins CMA   Authorized by:   Jacques Navy MD   Signed by:   [name] CMA on 12/04/2010   Method used:   Telephoned to ...       [facility] (retail)       [address]       [place], [place]  [address]       Ph: [phone]       Fax: [phone]   RxID:   [id number]

## 2010-12-18 ENCOUNTER — Encounter: Payer: Self-pay | Admitting: Internal Medicine

## 2010-12-19 ENCOUNTER — Encounter: Payer: Self-pay | Admitting: Internal Medicine

## 2010-12-19 ENCOUNTER — Ambulatory Visit (INDEPENDENT_AMBULATORY_CARE_PROVIDER_SITE_OTHER): Payer: Medicare Other | Admitting: Internal Medicine

## 2010-12-19 ENCOUNTER — Ambulatory Visit
Admission: RE | Admit: 2010-12-19 | Discharge: 2010-12-19 | Disposition: A | Discharge status: Home / Self Care | Payer: Medicare Other | Source: Ambulatory Visit | Attending: Internal Medicine | Admitting: Internal Medicine

## 2010-12-19 DIAGNOSIS — I4949 Other premature depolarization: Secondary | ICD-10-CM

## 2010-12-19 DIAGNOSIS — Z1239 Encounter for other screening for malignant neoplasm of breast: Secondary | ICD-10-CM

## 2010-12-26 NOTE — Assessment & Plan Note (Signed)
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 She has a history of bradycardia and thus has been  treated with Sectral. She has continued to have symptoms B. the relatively mild   April 2011 on Myoview scanning which was normal.  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 Take 1/2 Tablet Daily 2)  Gabapentin 300 Mg Caps (Gabapentin) .... Take 1 Capsule By Mouth Three Times A Day 3)  Hydrocodone-Acetaminophen 7.5-750 Mg Tabs (Hydrocodone-Acetaminophen) .... Take As Needed 4)  Nexium 20 Mg Cpdr (Esomeprazole Magnesium) .... Take One Capsule Once Daily 5)  Alprazolam 0.5 Mg Tabs (Alprazolam) .Marland Kitchen.. 1 By Mouth Q6 For Depression and Anxiety  Allergies (verified): No Known Drug Allergies  Past History:  Past Medical History: Last updated: 11/09/2009 PALPITATIONS, HX OF (ICD-V12.50); history of PVCs Chest pain-negative stress echocardiogram in 2009 Tobacco abuse: Discontinued in 2005 and 12/08 MIGRAINE HEADACHE (ICD-346.90) GASTROESOPHAGEAL REFLUX DISEASE (ICD-530.81); gastroparesis DEGENERATIVE DISC DISEASE, CERVICAL SPINE, 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)-status post surgical intervention x3 Depression  Past Surgical History: Last updated: 11/09/2009 C3-5 DECOMPRESSIVE laminectomy and fusion-1999 +2 additional surgical procedures TUBAL LIGATION, HX OF (ICD-V26.51) TOTAL ABDOMINAL HYSTERECTOMY, HX OF (ICD-V45.77)-1996 CHOLECYSTECTOMY, HX OF (ICD-V45.79)-1995     Family History: Last updated: 05/31/2010 Mother had multiple sclerosis Father had cardiac disease Sister has HTN No FH of Colon Cancer: Family History of Diabetes: mother  Social History: Last updated: 05/31/2010 Occupation: retired [employer] Department Married Alcohol use-no Drug use-no; no caffeine Tobacco Use - Yes.  quit dec 08 Regular Exercise - no Drug Use - no 4 CHILDREN; reports increased stress in due to caring for grandchildren UNEMPLOYED  Vital Signs:  Patient profile:   50 year old female Height:      70 inches Weight:      166 pounds BMI:     23.90 Pulse rate:   86 / minute Pulse rhythm:   irregular BP sitting:   118 / 80  (left arm) Cuff size:   regular  Vitals Entered By: Judithe Modest CMA (December 19, 2010 9:27 AM)  Physical Exam  General:  The patient was alert and oriented in no acute distress.Neck veins were flat, carotids were brisk. Lungs were clear. Heart sounds were irregular without murmurs or gallops. Abdomen was soft with active bowel sounds. There is no clubbing cyanosis or edema.    EKG  Procedure date:  12/19/2010  Findings:      sinus rhythm at 86 Intervals 0.16 respiratory 5.38 Axis LI PVCs in a pattern of trigeminy with a left bundle branch inferior axis morphology transition V2-V3  Impression & Recommendations:  Problem # 1:  PREMATURE VENTRICULAR CONTRACTIONS (ICD-427.69)  tpatient continues to have PVCs which are only modestly symptomatic. At this point we will continue her on her medications. At her 6 month visit we will recheck an echo to make is no change in left ventricular function  Her updated medication list for this problem includes:    Pindolol 5 Mg Tabs (Pindolol) .Marland Kitchen... Take 1/2 tablet daily  Her updated medication list for this problem includes:    Pindolol 5 Mg Tabs (Pindolol) .Marland Kitchen... Take 1/2 tablet daily  Other Orders: EKG w/ Interpretation (93000) Echocardiogram (Echo)  Patient Instructions: 1)  Your physician recommends that you continue on your current medications as directed. Please refer to the Current Medication list given to you today. 2)  Your physician has requested that you have an echocardiogram.  Echocardiography is a  painless test that uses sound waves to create images of your heart. It provides your doctor with information about the size and shape of your heart and how well your heart's chambers and valves are working.  This procedure takes approximately one hour. There are no restrictions for this procedure. 6 MONTHS WEE DR Graciela Husbands SAME DAY 3)  Your physician wants you to follow-up in: 6 MONTHS WITH DR Graciela Husbands ECHO SAME DAY  You will receive a reminder letter in the mail two months in advance. If you don't receive a letter, please call our office to schedule the follow-up appointment.

## 2011-01-13 LAB — COMPREHENSIVE METABOLIC PANEL
ALT: 16 U/L (ref 0–35)
AST: 19 U/L (ref 0–37)
Albumin: 3.8 g/dL (ref 3.5–5.2)
Alkaline Phosphatase: 50 U/L (ref 39–117)
BUN: 8 mg/dL (ref 6–23)
CO2: 23 mEq/L (ref 19–32)
Calcium: 9.1 mg/dL (ref 8.4–10.5)
Chloride: 107 mEq/L (ref 96–112)
Creatinine, Ser: 0.82 mg/dL (ref 0.4–1.2)
GFR calc Af Amer: >60 mL/min (ref >60)
GFR calc non Af Amer: >60 mL/min (ref >60)
Glucose, Bld: 96 mg/dL (ref 70–99)
Potassium: 4.1 mEq/L (ref 3.5–5.1)
Sodium: 138 mEq/L (ref 135–145)
Total Bilirubin: 1.3 mg/dL — ABNORMAL HIGH (ref 0.3–1.2)
Total Protein: 6.9 g/dL (ref 6.0–8.3)

## 2011-01-13 LAB — DIFFERENTIAL
Basophils Absolute: 0 10*3/uL (ref 0.0–0.1)
Basophils Relative: 1 % (ref 0–1)
Eosinophils Absolute: 0.1 10*3/uL (ref 0.0–0.7)
Eosinophils Relative: 1 % (ref 0–5)
Lymphocytes Relative: 40 % (ref 12–46)
Lymphs Abs: 3 10*3/uL (ref 0.7–4.0)
Monocytes Absolute: 0.4 10*3/uL (ref 0.1–1.0)
Monocytes Relative: 5 % (ref 3–12)
Neutro Abs: 4 10*3/uL (ref 1.7–7.7)
Neutrophils Relative %: 53 % (ref 43–77)

## 2011-01-13 LAB — CBC
HCT: 37.8 % (ref 36.0–46.0)
Hemoglobin: 12.5 g/dL (ref 12.0–15.0)
MCH: 29.6 pg (ref 26.0–34.0)
MCHC: 33.1 g/dL (ref 30.0–36.0)
MCV: 89.5 fL (ref 78.0–100.0)
Platelets: 271 10*3/uL (ref 150–400)
RBC: 4.23 MIL/uL (ref 3.87–5.11)
RDW: 12.6 % (ref 11.5–15.5)
WBC: 7.5 10*3/uL (ref 4.0–10.5)

## 2011-01-13 LAB — POCT CARDIAC MARKERS
CKMB, poc: <1 ng/mL — ABNORMAL LOW (ref 1.0–8.0)
Myoglobin, poc: 58 ng/mL (ref 12–200)
Troponin i, poc: <0.05 ng/mL (ref 0.00–0.09)

## 2011-01-13 LAB — D-DIMER, QUANTITATIVE: D-Dimer, Quant: 0.67 ug/mL-FEU — ABNORMAL HIGH (ref 0.00–0.48)

## 2011-01-16 LAB — CBC
HCT: 37.4 % (ref 36.0–46.0)
Hemoglobin: 12.9 g/dL (ref 12.0–15.0)
MCHC: 34.5 g/dL (ref 30.0–36.0)
MCV: 87.7 fL (ref 78.0–100.0)
Platelets: 282 10*3/uL (ref 150–400)
RBC: 4.27 MIL/uL (ref 3.87–5.11)
RDW: 12.7 % (ref 11.5–15.5)
WBC: 7.2 10*3/uL (ref 4.0–10.5)

## 2011-01-16 LAB — URINALYSIS, ROUTINE W REFLEX MICROSCOPIC
Bilirubin Urine: NEGATIVE
Glucose, UA: NEGATIVE mg/dL
Hgb urine dipstick: NEGATIVE
Ketones, ur: NEGATIVE mg/dL
Nitrite: NEGATIVE
Protein, ur: NEGATIVE mg/dL
Specific Gravity, Urine: <1.005 — ABNORMAL LOW (ref 1.005–1.030)
Urobilinogen, UA: 0.2 mg/dL (ref 0.0–1.0)
pH: 6 (ref 5.0–8.0)

## 2011-01-16 LAB — POCT CARDIAC MARKERS
CKMB, poc: <1 ng/mL — ABNORMAL LOW (ref 1.0–8.0)
Myoglobin, poc: 27.6 ng/mL (ref 12–200)
Troponin i, poc: <0.05 ng/mL (ref 0.00–0.09)

## 2011-01-16 LAB — DIFFERENTIAL
Basophils Absolute: 0.1 10*3/uL (ref 0.0–0.1)
Basophils Relative: 1 % (ref 0–1)
Eosinophils Absolute: 0.1 10*3/uL (ref 0.0–0.7)
Eosinophils Relative: 2 % (ref 0–5)
Lymphocytes Relative: 41 % (ref 12–46)
Lymphs Abs: 2.9 10*3/uL (ref 0.7–4.0)
Monocytes Absolute: 0.5 10*3/uL (ref 0.1–1.0)
Monocytes Relative: 7 % (ref 3–12)
Neutro Abs: 3.6 10*3/uL (ref 1.7–7.7)
Neutrophils Relative %: 50 % (ref 43–77)

## 2011-01-16 LAB — BASIC METABOLIC PANEL
BUN: 9 mg/dL (ref 6–23)
CO2: 22 mEq/L (ref 19–32)
Calcium: 8.6 mg/dL (ref 8.4–10.5)
Chloride: 105 mEq/L (ref 96–112)
Creatinine, Ser: 0.7 mg/dL (ref 0.4–1.2)
GFR calc Af Amer: >60 mL/min (ref >60)
GFR calc non Af Amer: >60 mL/min (ref >60)
Glucose, Bld: 103 mg/dL — ABNORMAL HIGH (ref 70–99)
Potassium: 3.9 mEq/L (ref 3.5–5.1)
Sodium: 134 mEq/L — ABNORMAL LOW (ref 135–145)

## 2011-01-16 LAB — PREGNANCY, URINE: Preg Test, Ur: NEGATIVE

## 2011-02-08 LAB — RAPID STREP SCREEN (MED CTR MEBANE ONLY): Streptococcus, Group A Screen (Direct): NEGATIVE

## 2011-03-13 NOTE — Assessment & Plan Note (Signed)
[facility]                        CARDIOLOGY OFFICE NOTE   RUBEN, PYKA                     MRN:          [id number]  DATE:04/11/2009                            DOB:          1961/07/25    REFERRING PHYSICIAN:  Rosalyn Gess. Norins, MD   Ms. Emerick returns to the office for continued assessment and  treatment of palpitations.  She feels somewhat better, but still is  greatly troubled by her cardiac irregularity.  She notes symptoms every  day, frequently lasting much of the day.  She wonders whether anxiety  could be contributing to these.  In addition to her usual medications,  she has been taking Sectral 200 mg b.i.d.  She neglected to increase the  dose after 2 weeks as we suggested at her last office visit.   She also is experiencing some postprandial bloating.  When this occurs,  she feels epigastric discomfort that is magnified with each heart beat.  She was taking Nexium for these symptoms without relief.   PHYSICAL EXAMINATION:  GENERAL:  Very pleasant woman in no acute  distress.  VITAL SIGNS:  The weight is 171, 2 pounds more than 2 months ago.  Blood  pressure 100/80, heart rate is 65 and regular, respirations 12 and  unlabored.  NECK:  No jugular venous distention; no carotid bruits.  LUNGS:  Clear.  CARDIAC:  Normal first and second heart sounds; modest systolic ejection  murmur.  ABDOMEN:  Soft and nontender; no organomegaly.  EXTREMITIES:  No edema.   Monitoring was carried out in the office for approximately 10 minutes.  No PVCs were seen.  This does not change the frequent PVCs that were  noted when she carried an event recorder, but it does preclude [place] from  determining whether exercise suppresses her ventricular arrhythmias.   IMPRESSION:  Ms. Ohanian continues to be symptomatic with premature  ventricular contractions.  I do not think there is a more worrisome  problem going on.  We will increase his  acebutolol to 200 mg t.i.d. and  then to 400 mg b.i.d. if adequate efficacy is not achieved.  If that  does not work, we will have to determine whether the risk of an  antiarrhythmic is worth the potential benefit.  I will reassess this  nice woman in 1 month.     Gerrit Friends. Dietrich Pates, MD, [facility]  Electronically Signed    RMR/MedQ  DD: 04/11/2009  DT: 04/12/2009  Job #: [id number]   cc:   Rosalyn Gess. Norins, MD

## 2011-03-13 NOTE — Assessment & Plan Note (Signed)
[facility]                         GASTROENTEROLOGY OFFICE NOTE   [name], [name]                     MRN:          [id number]  DATE:07/14/2007                            DOB:          [birth year]    This is a 50 year old African-American female who returns complaining of  abdominal gas and bloating and intermittent, aching chest pain.  The  symptoms are not well correlated.  She has a history of GERD,  musculoskeletal chest pain, palpitations, and PVCs.  She relates  intermittent difficulties with chest pains that migrate across her left  and right chest.  They are not correlated to meals, bowel movements,  gas, exertion, or any particular time of the day.  She also notes recent  troubles with gas and bloating.  She notes her stools have generally  been looser and more regular since using Nexium on a frequent basis.  Prior endoscopy, abdominal ultrasound, and gastric emptying scan were  unremarkable except for clinical symptoms of GERD.  She notes no change  in stool caliber, melena, hematochezia, or weight loss.   CURRENT MEDICATIONS:  Current medications listed on the chart, updated  and reviewed.   MEDICATIONS ON ADMISSION:  None known.   PHYSICAL EXAMINATION:  No acute distress.  Weight 152.2 pounds.  Blood  pressure is 106/62, pulse 64 and regular.  CHEST:  Clear to auscultation bilaterally.  Mild anterior chest wall  tenderness.  CARDIAC:  Regular rate and rhythm without murmurs appreciated.  ABDOMEN:  Soft, nontender, nondistended.  Normoactive bowel sounds.  No  palpable organomegaly, masses, or hernias.   ASSESSMENT/PLAN:  1. Chest pain. This does not appear to be gastrointestinal in origin.      Symptoms consistent with musculoskeletal pain.  Further management      per Dr. Illene Regulus.  2. GERD. Maintain standard anti-reflux measures and Nexium 40 mg p.o.      q.a.m.  3. Abdominal gas and bloating.  She is to begin a low  gas diet, Gas-X      q.i.d. p.r.n., and Symax SL 2 sublingually q.i.d. p.r.n. abdominal      pain.  4. Return office visit in 4-6 weeks.     Venita Lick. Russella Dar, MD, [facility]  Electronically Signed    MTS/MedQ  DD: 07/14/2007  DT: 07/14/2007  Job #: [id number]

## 2011-03-13 NOTE — Procedures (Signed)
[name], [name]              ACCOUNT NO.:  [id number]   MEDICAL RECORD NO.:  [id number]          PATIENT TYPE:  OUT   LOCATION:  RAD                           FACILITY:  [facility]   PHYSICIAN:  Gerrit Friends. Dietrich Pates, MD, FACCDATE OF BIRTH:  05-Sep-1961   DATE OF PROCEDURE:  11/21/2007  DATE OF DISCHARGE:                                ECHOCARDIOGRAM   STRESS ECHOCARDIOGRAM   REFERRING:  Dr. Hendricks Milo and Dr. Dietrich Pates.   CLINICAL DATA:  A 50 year old woman with chest pain.   1. Technically adequate echocardiographic study.  2. Treadmill exercise performed to a workload of 10 METS and a heart      rate of 158, 91% of age-predicted maximum.  Exercise discontinued      due to dyspnea and fatigue; no chest pain reported.  3. There was a normal increase in blood pressure with exertion.  No      arrhythmias noted.  4. Baseline EKG:  Normal sinus rhythm; prominent voltage; borderline      right atrial abnormality; borderline delayed R-wave progression;      minor nonspecific T-wave abnormality.  5. Stress EKG:  Upsloping ST-segment depression of 1-2 mm in the      inferior leads as well as V4 through V6; reverted towards normal      within the first minute of recovery.  6. Baseline echocardiogram:  Normal chamber dimensions; normal aortic      and tricuspid valve; very mild mitral valve thickening; normal left      and right ventricular function.  7. Post-exercise echocardiogram:  Myocardial segments showed either      some increase in contractility or no change.   IMPRESSION:  Negative stress echocardiogram demonstrating adequate  exercise tolerance, a borderline electrocardiographic response and no  echocardiographic evidence for ischemia or infarction.      Gerrit Friends. Dietrich Pates, MD, [facility]  Electronically Signed     RMR/MEDQ  D:  11/22/2007  T:  11/23/2007  Job:  [id number]

## 2011-03-13 NOTE — Assessment & Plan Note (Signed)
[facility]                       [place] CARDIOLOGY OFFICE NOTE   [name], [name]                     MRN:          [id number]  DATE:02/15/2009                            DOB:          1960-12-06    [name] returns to the office for continued assessment and  treatment of palpitations.  She carried an event recorder for 3 weeks,  identifying numerous episodes of chest discomfort, heart racing, skipped  beats, dyspnea, fatigue, and additional unlisted symptoms.  In most  cases, normal sinus rhythm with PVCs or ventricular bigeminy was  present.  There was a reasonable, but not perfect correlation between  symptoms and rhythm.   Medications are unchanged from her last visit.   Examination is also unchanged.  The blood pressure is 130/80 with a  heart rate of 60.  Weight is 169, 12 pounds more than in January 2009.  There are no significant pathologic cardiopulmonary findings.   IMPRESSION:  [name] has incomplete relief of symptoms with  atenolol.  We will discontinue that medication and try acebutolol  initially at a dose of 200 mg b.i.d. and then 400 mg b.i.d., if the  initial dose is ineffective.  I will reassess this nice woman in 1  month.     [name]. Laura Pates, MD, [facility]  Electronically Signed    RMR/MedQ  DD: 02/15/2009  DT: 02/16/2009  Job #: [id number]

## 2011-03-13 NOTE — Assessment & Plan Note (Signed)
[facility]                       [place] CARDIOLOGY OFFICE NOTE   LANISE, MERGEN                     MRN:          [id number]  DATE:01/05/2009                            DOB:          Jun 10, 1961    REFERRING PHYSICIAN:  Rosalyn Gess. Norins, MD   REFERRING PHYSICIAN:  Rosalyn Gess. Norins, MD    Ms. Nielson requested a return visit for recurrent palpitations.  She  has noted intermittent irregularities of her heartbeat over the past  month or so.  These occur daily and last for seconds to minutes.  She  has no associated chest discomfort, dyspnea, lightheadedness, nor  syncope.  She has not some intermittent epigastric pain that improves  when she takes pressure off the area by removing her bra.  She is using  no caffeine.  She is using no dietary supplements or over-the-counter  stimulants.  She has had increased stress due to caring for her  grandchildren on a daily basis.   CURRENT MEDICATIONS:  1. Atenolol 50 mg daily.  2. Gabapentin 300 mg t.i.d.  3. Nexium 40 mg daily.  4. Amitriptyline 25 mg nightly.  5. Vitamin D one daily.   Prior history was reviewed with the patient and prior records were  obtained.  She has not been seen by her primary care physician for some  time.  She stopped smoking approximately 13 months ago and has done well  with that.  She has not required emergency department evaluation or  hospitalization since late 2008 when she was seen in the ER for neck  pain.  She has longstanding cervical spine disease.  An MRI study did  not lead to any further treatment.   PHYSICAL EXAMINATION:  GENERAL:  Pleasant proportionate woman in no  acute distress.  VITAL SIGNS:  The weight is 171, 14 pounds more than in 2009.  Blood  pressure 130/70, heart rate 55 and regular, respirations 14 and  unlabored.  NECK:  Right surgical scar; no jugular venous distention; no bruits.  LUNGS:  Clear.  CARDIAC:  Normal first and second  heart sounds; fourth heart sound  present.  ABDOMEN:  Soft and nontender.  No organomegaly.  EXTREMITIES:  No edema.   EKG:  Normal sinus bradycardia; borderline left atrial enlargement;  otherwise unremarkable.  No change compared with a prior tracing of  November 07, 2007.   IMPRESSION:  Ms. Kozlowski is experiencing recurrent palpitations.  She  was last assessed in 2004 at which time she had PVCs.  Due to sinus  bradycardia, there is not much room to  increase her beta-blocker.  We will obtain another event recorder over  the next 3 weeks.  Basic  blood work including a chemistry profile, CBC, and TSH will be obtained.  I will assess this nice woman after those studies have been completed.     Gerrit Friends. Dietrich Pates, MD, [facility]  Electronically Signed    RMR/MedQ  DD: 01/05/2009  DT: 01/06/2009  Job #: [id number]

## 2011-03-13 NOTE — Assessment & Plan Note (Signed)
[name]                       [place] CARDIOLOGY OFFICE NOTE   [name], [name]                     MRN:          [id number]  DATE:11/07/2007                            DOB:          08/09/1961    REFERRING PHYSICIAN:  Rosalyn Gess. Norins, MD   [name] is seen in the office today at her request for chest  discomfort.  A friend of hers recently died due to myocardial infarction  prompting increased concern on her part.  She reports a vague mid  substernal chest discomfort when supine.  This does not sound  necessarily like GERD.  She also has some soreness in the muscles over  the upper left chest and some radiation into the left arm.  There is no  dyspnea nor diaphoresis.  She continues to have problems with her neck.  She has some numbness and pain in her left foot.   CURRENT MEDICATIONS:  1. Atenolol 50 mg daily.  2. Gabapentin 300 mg t.i.d.  3. Nexium 40 mg daily.  4. Hydrocodone/acetaminophen 5/500 mg p.r.n.   EXAM:  Pleasant woman in no acute distress.  The weight is 157, 5 pounds more than in September.  Blood pressure  115/80, heart rate 60 and regular, respirations 18.  NECK:  Surgical scar on the right side of the neck; no jugular venous  distension or carotid bruits.  LUNGS:  Clear.  CARDIAC:  Normal first and second heart sounds; fourth heart sound  present, and minimal systolic ejection murmur.  ABDOMEN:  Soft and nontender.  No bruits; no organomegaly.  EXTREMITIES:  No edema.   IMPRESSION:  [name] has chest discomfort with some characteristics  of ischemia, but with no exertional component.  She is quite concerned  about the possibility of heart disease.  We will proceed with stress  echocardiogram to further evaluate her symptoms.  Her last lipid profile  in 2007 was good.  She requests repeat.  I will not plan to reassess  this nice woman in the office unless her stress test is abnormal.     Gerrit Friends.  Dietrich Pates, MD, [facility]  Electronically Signed    RMR/MedQ  DD: 11/07/2007  DT: 11/07/2007  Job #: [id number]

## 2011-03-16 NOTE — Procedures (Signed)
   [name], [name]                        ACCOUNT NO.:  [id number]   MEDICAL RECORD NO.:  [id number]                   PATIENT TYPE:  EMS   LOCATION:  ED                                   FACILITY:  [facility]   PHYSICIAN:  Edward L. Juanetta Gosling, M.D.             DATE OF BIRTH:  11/13/1960   DATE OF PROCEDURE:  DATE OF DISCHARGE:  12/01/2002                                EKG INTERPRETATION   SUBJECTIVE:  Rhythm is a sinus rhythm in the 80s.  There is left atrial  enlargement.  Otherwise normal electrocardiogram.                                               Edward L. Juanetta Gosling, M.D.    ELH/MEDQ  D:  12/01/2002  T:  12/02/2002  Job:  [id number]

## 2011-03-16 NOTE — Op Note (Signed)
NAMEJAYCELYN, [name]                        ACCOUNT NO.:  [id number]   MEDICAL RECORD NO.:  [id number]                   PATIENT TYPE:  INP   LOCATION:  3013                                 FACILITY:  [facility]   PHYSICIAN:  Clydene Fake, M.D.               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   PROCEDURE:  C3-4 and C4-5 decompressive cervical laminectomy, posterior  fusion at C3-4 and C4-5, segmented lateral mass instrumentation with Summit  system, autograft __________ and allograft with DBM.   SURGEON:  Clydene Fake, M.D.   ASSISTANT:  Danae Orleans. Venetia Maxon, M.D.   ANESTHESIA:  General endotracheal Tuesday.   ESTIMATED BLOOD LOSS:  Less than 100 mL.   BLOOD GIVEN:  None.   DRAINS:  None.   COMPLICATIONS:  None.   INDICATIONS:  The patient is a 50 year old woman who has a posterior than  two anterior cervical surgeries with fusion from C4-7 and has had  progressive neck pain, arm pain and numbness worse on the right side and  sometimes weakness in the right leg.  MRI was done and x-ray that shows  instability at C3-4 with movement between flexion and extension.  This sign  is consistent with nonunion at C4-5 with movement in the spinous processes  and then disk bulge and cervical stenosis at C3-4 with cord change.  The  patient is brought in for decompression and fusion posteriorly at C3-C5.   DESCRIPTION OF PROCEDURE:  The patient was brought to the operating room and  general anesthesia was induced.  The patient was placed in  [place] head  pins were placed in appropriate position and all pressure points padded.  The patient was prepped and draped in the usual sterile fashion.  The site  of  incision was injected with 10 mL of 1% Lidocaine with epinephrine.  The  incision was then made at the site of the previous scar in the midline.  The  cervical spine was extended cephalad.  Incision was take down to the fascia  and hemostasis was obtained with Bovie cauterization.  The fascia was  incised and then subperiosteal dissection was done from the bottom of C2 to  C3, C4, C5 and the top of C6 spinous process, lamina and facets bilaterally.  We exposed the facets of C3, C4 and C5.  X-rays were obtained with markers  in the C3-4 and C4-5 facets and thus confirmed our position again.  The  laminectomy was performed using the Leksell rongeur and Kerrison punches,  removing the __________ C3, C4 and the top of C5 for a two level  decompression and laminectomy and  carried the decompression out laterally.  When we were finished, we had good decompression and the cord was pulsatile.  Hemostasis was obtained with Gelfoam and thrombin.  We then used the high  speed drill, decorticated the facets at C3-4 and C4-5.  The C3-4 facets were  very loose, and there was even some movement at C4-5.  There was attempted  anterior fusion there.  All of the bone that was removed during the  laminectomy was cleaned from soft tissue and chopped into small pieces.  An  awl was used to find the entry points on the right side at C3-4 and C5-6.  These holes were then drilled and then tapped and 12 mm Summit screws were  placed.  All of the bone that was chopped into small pieces was then packed  into the facets of C3-4 and C4-5 and lateral gutters.  A rod was placed over  the screw heads and locking nuts were placed.  Attention was then taken to  the left side and the facets were drilled out and the bone decorticated at  C3-4 and above.  An awl was used to start the entry point.  The levels were  drilled and tapped and 12 mm Summit screws were again placed.  We packed the  facet joints and lateral __________  with the autograft bone and placed a  rod.  The screw heads were placed and after, we final tightened all of the  nuts bilaterally at this point.  DBX bone putty was done and placed in the  lateral gutters to augment the autograft.  Gelfoam was placed over the dura  laterally so none of the bone would compress the spinal cord.  The  retractors were removed and hemostasis was obtained with Bovie  cauterization.  The paraspinous muscles were closed with 0 Vicryl  interrupted sutures.  The fascia was closed with the same.  The subcutaneous  incision was closed with 2-0 and 3-0 undyed Vicryl interrupted suture.  The  skin was closed with Benzoin and Steri-Strips.  A dressing was placed.  Patient was placed in a hard cervical collar.  She was transferred back to a  supine position, the pins were removed, awoken from anesthesia and  transferred to the recovery room in stable condition.                                               Clydene Fake, M.D.    JRH/MEDQ  D:  12/17/2003  T:  12/18/2003  Job:  [id number]

## 2011-03-16 NOTE — Assessment & Plan Note (Signed)
[facility]                             PRIMARY CARE OFFICE NOTE   [name], [name]                     MRN:          [id number]  DATE:09/17/2006                            DOB:          [birth year]    [name] is a 50 year old, African-American woman well known to me  followed for a history of cervical spine disease status post surgical fusion  by Dr. Phoebe Perch.  The patient is also status post cholecystectomy.  She is  also status post TAH for benign disease in 1996.  I last saw the patient in  the office June 27, 2006, when she was having a question of right upper  quadrant pain, looked for a possible residual common bile duct obstruction.  This evaluation turned out to be unremarkable including a full evaluation by  the cardiology department, July 15, 2006.  The patient is also with  some dysphagia also evaluated by the GI department and the patient is on  chronic PPIs.   The patient has had a problem with recurrent pain that has radiated to her  chest.  She has been seen by Dr. Dietrich Pates for cardiology evaluation and,  evidently, it is thought to be no significant cardiovascular disease as a  cause of her pain.   The patient reports she is having a significant and ongoing pain in her left  arm, neck, and hand.  She described it as a sharp, electric-type pain, a  burning-type discomfort.  She has had weakness in that extremity as well as  some loss of sensation.  Of note, the patient's last visit with Dr. Phoebe Perch  from Feb 27, 2005, indicates that he felt that she had had a fusion that  looked to be in good position and a study had been done, a myelogram  actually, which showed good fusion formed at 4-5, 5-6, and 6-7 levels with  no cord compression, no root compression anywhere through the cervical  spine.  EMG and nerve conduction velocities were normal for the left arm.  The patient, now, is being followed at the [facility] by Dr. Manon Hilding.  Dr. Manon Hilding is prescribing Gabapentin at 300 mg  t.i.d. and Vicodin 5/500 t.i.d.  Despite these treatments, the patient  continues to have the discomfort as noted above.   CURRENT MEDICATIONS:  1. Atenolol 50 mg daily.  2. Nexium 40 mg daily.  3. Gabapentin 300 mg t.i.d.  4. Vicodin 5/500 t.i.d. but with which she is taking on a p.r.n. basis.   REVIEW OF SYSTEMS:  Negative for any constitutional, ENT, cardiovascular, or  respiratory problems.  GI:  With heartburn, well controlled with PPI.  MUSCULOSKELETAL:  As above.   EXAMINATION:  Temperature was 98.8, blood pressure 141/82, pulse 58, weight  167.  GENERAL APPEARANCE:  A well-nourished, well-developed woman in no acute  distress but clearly uncomfortable.  MUSCULOSKELETAL/NEUROLOGIC:  The patient is awake, alert, and oriented.  She  has full range of motion of both upper extremities actively.  She has a  decreased grip strength rated at 3+/5 in  the left hand.  I would say she has  decreased strength in the left arm in regards to biceps and triceps but no  fasciculations are noted.  She has a decreased sensation to light sensation.  She had a decrease sensation to deep vibratory sensation.  No further exam  conducted.   ASSESSMENT AND PLAN:  Left upper extremity discomfort.  Leg pain and neck  pain.  I would be concerned as to whether or not the patient could have any  change in her cervical spine in regards to her discomfort despite normal  myelogram one year ago.  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   PLAN:  1. We will set the patient up for a MRI of the cervical spine to reassess      her fusion as well as to rule out any nerve compression syndrome.  2. The patient is to continue seeing Dr. Manon Hilding.  She may benefit from      either increasing her Gabapentin or/and possibly  a course of      acupuncture.  Other treatments probably will be available to her beyond      my scope of knowledge from Dr. Manon Hilding in regards to pain management and      possible RSD.  3. The patient will otherwise continue on her other medications.     Rosalyn Gess Norins, MD  Electronically Signed    MEN/MedQ  DD: 09/17/2006  DT: 09/17/2006  Job #: [id number]   cc:   Clydene Fake, M.D.  Dr. Manon Hilding

## 2011-03-16 NOTE — Discharge Summary (Signed)
[name], [name]                        ACCOUNT NO.:  [id number]   MEDICAL RECORD NO.:  [id number]                   PATIENT TYPE:  INP   LOCATION:  3013                                 FACILITY:  [facility]   PHYSICIAN:  Clydene Fake, M.D.               DATE OF BIRTH:  [birth year]   DATE OF ADMISSION:  12/17/2003  DATE OF DISCHARGE:  12/20/2003                                 DISCHARGE SUMMARY   ADMISSION DIAGNOSES:  1. Cervical stenosis bilaterally.  2. Previous nonunion.   DISCHARGE DIAGNOSES:  1. Cervical stenosis bilaterally.  2. Previous nonunion.   OPERATION/PROCEDURE:  1. Two level decompressive cervical laminectomy at C3-4 and C4-5.  2. Posterior fusion, C3-4 and C4-5, with autograft segmented lateral mass     instrumentation with Summit system and allograft use.   REASON FOR ADMISSION:  The patient is a 50 year old woman who has had  previous posterior and anterior neck surgery in 1998 and has had similar  chronic intermittent neck pain since last year that has worsened and she  started developing bilateral pain and numbness in the right side with  weakness including legs, some myelopathic findings on exam.  MRI was done  and also some x-rays showing stenosis with ________ changes C3-4, some  instability at C3-4 level.  Probable nonunion at C4-5.  Anterior __________  prior to posterior decompression and fusion.   HOSPITAL COURSE:  The patient was admitted to Day Surgery and underwent the  procedure above without complications and postoperatively transferred to  recovery room and then to the floor.  The next day she was up ambulating.  Had moderate incisional pain.  Incision was clean, dry and intact.  She was  doing well.  We removed the PCA.  The following day she had much less arm  pain and tingling and is ambulating well with still some significant pain.  She became stable and incision remained clean, dry, and intact. She was  improved and neurologically good  function on December 20, 2003.  She was  discharged in stable condition.   DISCHARGE MEDICATIONS:  Same as prior to hospitalization plus Percocet and  Valium p.r.n.   DIET:  As tolerated.   ACTIVITY:  No strenuous activity.  C-collar all the time.   FOLLOW UP:  Follow up with my office in three to four weeks.                                               Clydene Fake, M.D.   JRH/MEDQ  D:  01/06/2004  T:  01/08/2004  Job:  [id number]

## 2011-03-16 NOTE — Letter (Signed)
July 12, 2006     Rosalyn Gess. Norins, MD  [address]  [place], [place] [address]   RE:  REMINGTON, SKALSKY  MRN:  [id number]  /  DOB:  1961/09/23   Dear Kathlene November:   Ms. Calix returns to the office for continued assessment and treatment of  palpitations and chest discomfort.  The latter is generally associated with  the former.  She has been having problems with Sectral, which has been  causing burning chest discomfort consistent with dyspepsia.  She has been  seen in the emergency department on 2 occasions, once in January and once in  March for these symptoms.  She is currently experiencing an exacerbation.   Otherwise, interval medical history is benign since her last visit  approximately 18 months ago.  She has been referred to our  gastroenterologist and is to undergo endoscopic procedures.  She had an  abdominal ultrasound that was apparently negative.  She has also been  referred to Dr. Graciela Husbands.   CURRENT MEDICATIONS:  1. Neurontin 300 mg t.i.d.  2. Sectral 400 mg b.i.d. p.r.n.  3. Nexium 40 mg daily.  4. Etodolac 500 mg b.i.d.   PHYSICAL EXAMINATION:  GENERAL:  A pleasant, anxious young woman in no acute  distress.  VITAL SIGNS:  The weight is 152, 6 pounds less than in May 2006, blood  pressure 100/70, heart rate 68 and irregular, respirations 16.  NECK:  No jugular venous distention.  Normal carotid upstrokes without  bruits.  LUNGS:  Clear.  CARDIAC:  Prominent fourth heart sound.  ABDOMEN:  Soft and nontender.  No organomegaly.  EXTREMITIES:  No edema.  Distal pulses intact.   ELECTROCARDIOGRAM:  Normal sinus rhythm.  PVC's.  Right atrial enlargement,  left atrial enlargement.  Otherwise, unremarkable.   RHYTHM STRIP:  Frequent PVC's.   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  Since she is  experiencing problems with Sectral, I have asked her to discontinue that  medication and start atenolol 50 mg daily on a routine basis.  Dr. Graciela Husbands can  proceed as he deems appropriate next week.  I will reassess this nice woman  again in 2 months.    Sincerely,      Gerrit Friends. Dietrich Pates, MD, [facility]   RMR/MedQ  DD:  07/12/2006  DT:  07/13/2006  Job #:  [id number]

## 2011-03-16 NOTE — Letter (Signed)
September 13, 2006    Rosalyn Gess. Norins, MD  [address]  [place], [place] [address]   RE:  DUCHESS, ARMENDAREZ  MRN:  [id number]  /  DOB:  April 03, 1961   Dear Kathlene November:   Ms. 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 As you know, she was evaluated by Dr. Clide Cliff,  who recommended verapamil. She elected to simply continue atenolol, which  for the most part, is controlling her palpitations. She notes increased  symptoms when she eats chocolate or uses tobacco products. She would like to  discontinue the latter.   She also reports paresthesias in her left leg that are intermittent. She has  some radicular pain in a sciatic distribution. As you know, she has had  substantial problems with her cervical spine but no previous documented  lumbosacral spine disease.   She has episodes of chest discomfort, which have taken her to the emergency  department on occasion. These have been considered musculoskeletal in  origin.  She is followed by a pain clinic in [facility] for her neck  problems.   CURRENT MEDICATIONS:  1. Atenolol 50 mg daily.  2. Nexium 40 mg daily.  3. Gabapentin 300 mg t.i.d.  4. Hydrocodone p.r.n.  5. Diazepam p.r.n.   EXAMINATION:  ON EXAM,  a trim, pleasant woman in no acute distress. The  weight is 156, 4 pounds more than in September.  VITAL SIGNS: Blood pressure 125/80, heart rate 72 and regular, respiration  16.  NECK: No jugular venous distension; normal carotid upstrokes without bruits.  LUNGS: Clear.  CARDIAC: Normal first and second heart sounds; minimal basilar systolic  murmur.  ABDOMEN: Soft, nontender; no organomegaly.  EXTREMITIES: No edema.  NEUROMUSCULAR: Symmetric strength and tone; normal reflexes in the lower  extremities.   IMPRESSION:  Ms. Devera is doing well from a cardiac stand-point. I  reemphasized the fact that her arrhythmia is benign. She is happy with the  control currently  afforded by atenolol.   Her left leg symptoms sound as if they are likely related to degenerative  joint disease of the lumbosacral spine. In the absence of neurologic  findings, there is no urgency for evaluation.  She will contact Dr. Phoebe Perch  should symptoms persist.   She previously discontinued cigarette smoking for 2 years without any  nicotine replacement. She is not inclined to use over-the-counter patches,  concerned about the effects of nicotine on her arrhythmia. She will take  Chantix for a 55-month course and discontinue use of tobacco products.   Except for her spine disease, Ms. Wisdom appears to be doing fairly well.  I will be happy to reassess this nice woman in 6 months.    Sincerely,      Gerrit Friends. Dietrich Pates, MD, [facility]  Electronically Signed    RMR/MedQ  DD: 09/13/2006  DT: 09/13/2006  Job #: [id number]   CC:    Rosalyn Gess. Norins, MD

## 2011-03-16 NOTE — Op Note (Signed)
[place]. [facility]  Patient:    [name]                        MRN: [id number] Proc. Date: 11/30/99 Adm. Date:  [id number] Disc. Date: [id number] Attending:  Colon Branch                           Operative Report  PROCEDURE:  Removal of anterior cervical plate from C5 through C7 with exploration of fusion at C5-6 and C6-7, with anterior cervical diskectomy at C4-5 and redo anterior cervical diskectomy and fusion at C5-6.  All with allograft anterior cervical plate C4, through C7 and tong traction.  SURGEON:  Clydene Fake, M.D.  ASSISTANT:  Izell Indianola. Elesa Hacker, M.D.  ANESTHESIA:  General endotracheal.  ESTIMATED BLOOD LOSS:  250 cc.  BLOOD GIVEN:  None.  DRAINS:  None.  COMPLICATIONS:  None.  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  The patient is brought in for surgery.  PROCEDURE IN DETAIL:  The patient was brought to the operating room and general  endotracheal anesthesia was induced.  The patient had Gardner-Wells tongs placed and then was prepped and draped in a sterile fashion.  A separate incision was injected 10 cc of 1% lidocaine with epinephrine.  A carotid type incision was then made to the anterior border of the sternocleidomastoid on the right side of the  neck.  The incision was taken down to the platysma and hemostasis was obtained ith Bovie cauterization.  The platysma was incised with the Bovie and blunt dissection was taken down to the anterior cervical spine.  There was scar tissue there that we were able to separate from the esophagus easily.  We then had to enter with sharp dissection over the scar tissue on top of the anterior cervical plate.  We were  able to expose from C4 through C7.  A  needle was placed in the C4-5 disk space nd x-ray obtained, confirming this position.  The plate from C5 through C7 was then removed.  The right side screws at C5 and C7 were both loose.  The plate was removed.  We then explored the fusion at C5-7 and C6-7.  At C5-6, there was movement there showing a nonunion pretty clearly.  At C6-7, it looked like there was a good fusion with no movement over the area.  The longus colli muscle was reflected laterally on each side.  Hand held retractor system was then placed. At C4-5, the Cloward drill guide was placed over the disk space after the space was incised with a #15 blade and partial diskectomy performed.   A 10 mm Cloward drill was then used to drill down through the disk space and vertebral bodies, leaving a thin layer of posterior cortex.  We then continued the diskectomy under the microscope for magnification.  Using 1 mm and 2 mm Kerrison punches, we performed bilateral foraminotomies and removed the herniated disk on the right side.  We hen turned our attention to the C5-6 level.  There we used the Midas Rex drill and drilled through the nonunion.  We did this in what would be a Smith-Robinson-type procedure drilling off the  complex fibrous tissue that was there, rather than hard bone.  We got down to the left foramen and then performed a generous foraminotomy on the left side.  After the vertebral bodies of both spaces were then measured, in the C4-5 space, a 12 mm bone dowel was cut to be a few millimeters shorter than the depth of the vertebral bodies was tapped into place with traction on the Gardner-Wells tongs.  We then took an iliac crest allograft tricortical graft and cut that to size (about 6 mm in height) and made sure the depth was shorter than the depth of the vertebral bodies.  With traction, I tapped that into place. he wound was irrigated with antibiotic solution and we used a 51 mm Synthes-Stuart  stature  plate and placed it from C7 through C4.  We used the same screw holes in C7 for the bottom screws of the plate, though we used larger screws for the remainder of the screws.  We then placed two screws in the C4.  We also placed two screws  into C6.  X-rays showed good position of the plate, screws and bone grafts. Locking screws were then placed.  The retractors were removed.  Hemostasis was obtained with Gelfoam and thrombin and bipolar cauterization.  The Gelfoam was hen irrigated out. The platysma was then closed with 3-0 Vicryl interrupted sutures, along with the subcutaneous tissue.  The skin was closed with running 3-0 nylon  suture.  A dressing was placed over the incision.  The Gardner-Wells tongs were  removed.  The patient was placed in an anterior cervical collar, awakened from anesthesia and transferred to the recovery room. DD:  12/01/99 TD:  12/01/99 Job: [id number]

## 2011-03-16 NOTE — Procedures (Signed)
[facility]                            ABDOMINAL ULTRASOUND REPORT   MIKEALA, GIRDLER                     MRN:          [id number]  DATE:07/03/2006                            DOB:          19-Jun-1961    ACCESSION NUMBER:  [id number]   READING PHYSICIAN:  Vania Rea. Jarold Motto, MD, Clementeen Graham, FACP.   PROCEDURE:  Multiplanar abdominal ultrasound imaging was performed in the  upright, supine, right and left lateral decubitus positions.   RESULTS:  Abdominal aorta 1.6.  The IVC is patent.   The pancreas appears normal throughout the head, body and tail without  evidence of ductal dilatation, pancreatic masses, or peripancreatic  inflammation.   Gallbladder is surgically absent.   The common bile duct measures 5.4 mm in maximal diameter without evidence of  intraluminal foci.   The liver appears normal without evidence of parenchymal lesion, ductal  dilatation or vascular abnormality.   Kidneys are normal in appearance.  Right kidney is 9.5 cm, left is 10.3 cm.   Spleen is normal in size, measuring 7.9 cm without parenchymal lesion.   ASSESSMENT:  This is a  normal upper abdominal ultrasound examination in a  patient who is status post cholecystectomy.  There is no evidence of biliary  ductal dilatation.  The pancreas is well imaged and appears normal.                                   Vania Rea. Jarold Motto, MD, Clementeen Graham, [place]   DRP/MedQ  DD:  07/03/2006  DT:  07/04/2006  Job #:  [id number]

## 2011-03-16 NOTE — Procedures (Signed)
NAMEELFRIEDA, ESPINO                        ACCOUNT NO.:  [id number]   MEDICAL RECORD NO.:  [id number]                   PATIENT TYPE:  OUT   LOCATION:  RAD                                  FACILITY:  [facility]   PHYSICIAN:  [place] Bing, M.D.               DATE OF BIRTH:  28-May-1961   DATE OF PROCEDURE:  DATE OF DISCHARGE:                                  ECHOCARDIOGRAM   REFERRING PHYSICIAN:  Hayward Bing, M.D.   CLINICAL DATA:  A 50 year old woman with palpitations.   M-MODE:  1.  Aorta 2.8.  2.  Left atrium 3.3.  3.  Septum 0.9.  4.  Posterior wall 0.8.  5.  LV diastole 5.0.  6.  LV systole 3.1.   1.  Technically adequate echocardiographic study.  2.  Normal left atrium, right atrium, and right ventricle.  3.  Slight thickening of the mitral valve with flat coaptation but no      definite prolapse.  Trace mitral regurgitation.  4.  Normal aortic, tricuspid, and pulmonic valves.  Physiologic tricuspid      regurgitation.  5.  Normal internal dimension, wall thickness, regional and global function      of the left ventricle.  6.  Normal IVC.      ___________________________________________                                            [place] Bing, M.D.   RR/MEDQ  D:  06/27/2004  T:  06/28/2004  Job:  [id number]

## 2011-03-16 NOTE — Letter (Signed)
July 18, 2006     Rosalyn Gess. Norins, MD  [address]  [place], [place] [address]   RE:  [name], [name]  MRN:  [id number]  /  DOB:  03/07/1961   Dear Casimiro Needle:   It was a pleasant to see [name] today at your request because of her  recurrent palpitations.   As you know, she is a 50 year old, married, mother of 2, who is one of 46  children, who has a 2 to 3-year history of recurrent palpitations.  She  describes these primarily as thumping and pounding in her chest.  They are  accompanied by lightheadedness, fatigue, presyncope, and they are  accompanied also on occasion by significant anxiety, notwithstanding the  fact that she has been reassured multiple times that these are nonfatal.  These occurred again yesterday, and she went to the [facility] Emergency  Room, where the doctor there told her she needed to be concerned that she  might have a heart attack with these, so her anxiety level today was a  little bit higher than normal.  That comment was brought on apparently by  the fact that she was having left arm discomfort with this episode  yesterday.  She also describes, however, headache and left leg pain.   She has been treated in the past with Toprol.  This was changed to Sectral,  and recently changed to atenolol, both because of lack of efficacy and  potential side effects including nausea.   These spells are becoming increasingly frequent.  They are worsened with  caffeine, stress and exercise.  She does not use any over-the-counter cold  medicines.   PAST MEDICAL HISTORY:  In addition to the above, is notable for:  1. Anxiety.  2. Gastroesophageal reflux disease.   PAST SURGICAL HISTORY:  Notable for 3 neck surgeries by Dr. Phoebe Perch, a  gallbladder surgery and a previous tubal ligation.   SOCIAL HISTORY:  She is retired from the [employer].  She has 2  children.  She is married.  She does not use alcohol or recreational drugs.  She does  smoke a pack per day, and she is working on stopping.   CURRENT MEDICATIONS:  1. Atenolol 50 mg.  2. Neurontin 300 mg t.i.d.  3. Nexium 40 mg.  4. Diazepam.   ALLERGIES:  She has no known drug allergies.   PHYSICAL EXAMINATION:  GENERAL:  She is a younger to middle-aged [place]  American female in no acute distress.  VITAL SIGNS:  Her blood pressure was 116/80, her pulse was  53, her weight  was 152.  HEENT:  Demonstrated no icterus or xanthoma.  NECK:  The neck veins were flat.  The carotids were brisk and full  bilaterally without bruits.  BACK:  Without kyphosis or scoliosis.  LUNGS:  Clear.  CARDIAC:  Heart sounds were regular without murmurs or gallops.  ABDOMEN:  Soft with active bowel sounds, without midline pulsation, or  hepatomegaly.  EXTREMITIES:  Femoral pulses were 2+, distal pulses were intact, and there  is no clubbing, cyanosis or edema.  NEUROLOGIC:  Exam was grossly normal.  SKIN:  Warm and dry.   Electrocardiogram dated today demonstrated sinus rhythm at 53, with  intervals of 0.16/0.08/0.44.  The axis was 75 degrees.   Review of the electrocardiogram from yesterday demonstrates ventricular  bigeminy, with a pattern consistent with an RVOT origin, with a right-sided  focus, with a transition in V3.   IMPRESSION:  1.  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  2. Relative bradycardia.  3. Left arm pain.  4. Recurrent neck surgeries.   Casimiro Needle, Ms. Loomer has frequent ventricular ectopy from the right  ventricular outflow tract that is quite symptomatic.  She has been tried on  a variety of beta blockers, up-titration which I presume has been limited by  her relative bradycardia.  I have reviewed with her treatment options,  adjunctive or alternative therapy with calcium blockers, antiarrhythmic drug  therapy probably with a 1C agent or EP study with RF catheter ablation.  The  latter is limited by  the inducibility of the tachyarrhythmia in the lab as  well as the frequency, which contributes to the ease of mapping.   We discussed the potential proarrhythmic risks with antiarrhythmic drugs,  and the potential complications of the procedure.  For right now, she would  like to pursue drug therapy.  To that end, I have given her a prescription  for verapamil 120 to take twelve hours apart from her atenolol.  We will try  a variety of different beta blockers/calcium blockers.  I will plan to see  her again in 3 months' time.   Because of her left arm discomfort, and because of the potential  alternatives of the antiarrhythmic drugs, we will undertake a Myoview scan  to exclude ischemia.   Casimiro Needle, thank you for the consultation.    Sincerely,      Duke Salvia, MD, [facility]   SCK/MedQ  DD:  07/18/2006  DT:  07/20/2006  Job #:  [id number]   CC:    Gerrit Friends. Dietrich Pates, MD, [facility]

## 2011-03-16 NOTE — H&P (Signed)
[place]  Patient:    [name]                        MRN: [id number] Adm. Date:  [id number] Attending:  Colon Branch                         History and Physical  CHIEF COMPLAINT:  Neck and bilateral upper extremity pain and numbness.  HISTORY:  The patient is a 50 year old woman is having worsening neck pain, pain going into the right shoulder and down her upper arm with some numbness in that  area but also some pain down her left arm towards her thumb with numbness there. The pain has been worsening.  She is status post posterior foraminotomy of the cervical spine and then anterior cervical diskectomy and fusion at C5-6 and 6-7. She underwent myelogram which showed nonunion at 5-6 with some pressure on the eft C6 root, also a herniated disc on the right at C4-5.  The patient is brought in for surgery.  PAST MEDICAL HISTORY:  Past history is negative.  PAST SURGICAL HISTORY:  Past surgical history includes a tubal ligation in 1985, gallbladder surgery in 1996, hysterectomy in 1999, and the surgery as mentioned  above.  CURRENT MEDICATIONS: 1. Elavil 25 q. h.s. 2. Flexeril. 3. Nonsteroidal-type inflammatories.  ALLERGIES:  No known drug allergies.  SOCIAL HISTORY:  She is employed as a Copy.  She still smokes a cigarette and drinks alcohol occasionally.  REVIEW OF SYSTEMS:  Otherwise negative.  FAMILY HISTORY:  Noncontributory.  PHYSICAL EXAMINATION:  GENERAL:  The patient is in mild distress.  HEENT:  Unremarkable.  LUNGS:  Clear.  HEART:  Regular rhythm.  ABDOMEN:  Soft, nontender.  EXTREMITIES:  Intact.  There is no edema.  NECK AND NEUROLOGIC EXAM:  She has positive Spurlings bilaterally.  Some paraspinous muscle spasm on both sides and stiffness in the posterior neck. Positive brachial plexus tenderness bilaterally, worse on the right.  Strength s intact on the right, there is slight weakness  in the left biceps.  Sensation is  decreased in the left C6 distribution.  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  PLAN:  Will admit patient for ___ and fusion of and ACF 4-5 and probably 5-6. DD:  11/30/99 TD:  11/30/99 Job: [id number]

## 2011-03-16 NOTE — Assessment & Plan Note (Signed)
[facility]                           GASTROENTEROLOGY OFFICE NOTE   [name], [name]                     MRN:          [id number]  DATE:07/15/2006                            DOB:          September 29, 1961    REASON FOR REFERRAL:  Epigastric pain and reflux symptoms.   HISTORY OF PRESENT ILLNESS:  [name] is a 50 year old African-American  female referred through the courtesy of Dr. Illene Regulus.  She relates  problems with epigastric discomfort and heartburn that began about 1 year  ago.  She was initially treated with Prevacid, but then this was changed to  Nexium.  She has rare episodes of heartburn.  Her symptoms are more  noticeable when she reclines at night.  Dr. Debby Bud' most recent note  indicate a history of dysphagia, but today the patient denies any problems  with swallowing.  She has no dysphagia or odynophagia.  She does note some  intermittent chest pain, epigastric pain, gas, and bloating.  Over the past  two weeks, she has had loose stools.  They have now abated.  She did have  nausea and vomiting for two days just prior to the onset of her looser  stools.  Her stools became dark while she was taking the Pepto-Bismol, but  she denies black, tarry, sticky stools.  She denies maroon stools and rectal  bleeding.  Her appetite is good and her weight is stable.  Abdominal  ultrasound read by Dr. Sheryn Bison earlier this month was unremarkable,  except for a prior cholecystectomy.  CT scan of the abdomen and a pelvic  ultrasound have revealed a septated left adnexal cystic mass.  She has had a  prior hysterectomy, as well as a prior cholecystectomy.  She notes worsening  palpitations when she has more gas, bloating, and belching.  She denies  weight loss or rectal pain.  NO family history of colon cancer, colon  polyps, or inflammatory bowel disease.   PAST MEDICAL HISTORY:  Depression.  Palpitations.  Status-post  cholecystectomy.  Status-post hysterectomy.  Status-post bilateral tubal  ligation.   MEDICATIONS:  Listed on the maintenance medication sheet.   MEDICATION ALLERGIES:  NONE KNOWN.   SOCIAL HISTORY:  Per the hand-written evaluation form.   REVIEW OF SYSTEMS:  Per the hand-written evaluation form.   PHYSICAL EXAM:  GENERAL:  In no acute distress.  VITAL SIGNS:  Height 5 feet, 6 inches.  Weight 152.6 pounds.  Blood pressure  is 98/52, pulse 64 and regular.  HEENT:  Anicteric sclerae.  Oropharynx clear.  CHEST:  Clear to auscultation bilaterally.  CARDIOVASCULAR:  Regular rate and rhythm without murmurs appreciated.  ABDOMEN:  Soft with epigastric tenderness to deep palpation.  No rebound or  guarding.  No palpable organomegaly, masses, or hernias.  Normoactive bowel  sounds.  EXTREMITIES:  Without cyanosis, clubbing, or edema.  NEUROLOGIC:  Alert and oriented times 3.  __________  Nonfocal.   ASSESSMENT AND PLAN:  1. Gastroesophageal reflux disease.  Epigastric pain.  Gas.  Bloating.      Belching.  Rule out refractory GERD, gastroparesis, and dyspepsia.  Continue Nexium 40 mg p.o. q. a.m. and begin all standard anti-reflux      measures, along with a low-gas diet.  Risks, benefits, and alternatives      to upper endoscopy with possible biopsy discussed with the patient.      She consents to proceed.  This will be scheduled electively.  2. Gynecologic follow-up is planned for her left ovarian cystic lesion.                                   Laura Lick. Russella Dar, MD, [name]   MTS/MedQ  DD:  07/15/2006  DT:  07/15/2006  Job #:  [id number]   cc:   Rosalyn Gess. Norins, MD

## 2011-03-16 NOTE — Discharge Summary (Signed)
[place]  Patient:    [name]                        MRN: [id number] Adm. Date:  [id number] Disc. Date: [id number] Attending:  Colon Branch Dictator:   Clydene Fake, M.D.                           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  PROCEDURE:  Removal of anterior cervical plate, U1-3 with exploration and fusion at C5-6 and C6-7 with anterior cervical diskectomy and fusion at C4-5, redo anterior cervical diskectomy and fusion at C5-6, both with Allografts.  Anterior cervical plates from K4-4 with tong traction.  REASON FOR ADMISSION:  The patient is a 50 year old woman who has been through posterior foraminotomies and ACF in 2000 at C5-6 and C6-7 whose has been having  more neck pain, left C6 radicular symptoms, and right shoulder upper arm pain.  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  The patient is brought in now for surgery.  HOSPITAL COURSE:  The patient was admitted on the day of surgery, and underwent the procedure named above.  The patient tolerated the procedure well without complications.  The patient was transferred to the recovery room and to the floor. There, she has been in moderate to minimal pain, usually controlled, up ambulating. She is having no trouble swallowing or eating.  Dressing is clean, dry, and intact, and she is wearing a C-collar.  The patient has been doing well, no arm pain. e will discharge her home in stable condition.  DISCHARGE MEDICATIONS:  Same as pre-hospitalization except no NSAIDS, plus Vicodin ES one or two p.o. q.4-6h. p.r.n., Flexeril 10 mg p.o. q.8h. p.r.n. spasms.  ACTIVITY:  No strenuous activity.  WOUND CARE:  Keep incision dry until sutures removed.  DIET:  As tolerated.  FOLLOWUP:  With me in one week for suture removal in my office. DD:   12/01/99 TD:  12/02/99 Job: [id number]

## 2011-03-16 NOTE — Assessment & Plan Note (Signed)
[facility]                       [place] CARDIOLOGY OFFICE NOTE   CAMDYN, LADEN                     MRN:          [id number]  DATE:02/19/2007                            DOB:          1960/12/11    Ms. Dicenso returns to the office for continued assessment and  treatment of palpitations, PVCs and atypical chest discomfort.  Her  symptoms have been stable to improved.  She has minor palpitations now  and again that she does not find terribly troublesome.  She has had no  significant chest discomfort.  Her principal problems involve insomnia  and chronic pain related to cervical spine disease.  She has not yet  tried Chantix and is continuing to smoke cigarettes.   PHYSICAL EXAMINATION:  GENERAL:  A pleasant woman in no acute distress.  VITAL SIGNS:  The weight is 156, stable.  Blood pressure 120/75, heart  rate 65 and regular, respirations 16.  NECK:  Multiple surgical scars.  No jugular venous distention.  LUNGS:  Clear.  CARDIAC:  Normal first and second heart sounds; prominent fourth heart  sound.  ABDOMEN:  Soft and nontender; no organomegaly.  EXTREMITIES:  No edema.   EKG:  Normal sinus rhythm; left atrial abnormality; slightly delayed R  wave progression; otherwise unremarkable.  Since a prior tracing of  July 18, 2006, left atrial enlargement is now apparent.   IMPRESSION:  Ms. Limberg is doing well with current therapy.  She will  contact me if she develops additional problems with which I can assist.     Gerrit Friends. Dietrich Pates, MD, [facility]  Electronically Signed    RMR/MedQ  DD: 02/19/2007  DT: 02/19/2007  Job #: [id number]

## 2011-04-27 ENCOUNTER — Encounter: Payer: Self-pay | Admitting: Gastroenterology

## 2011-06-01 ENCOUNTER — Other Ambulatory Visit: Payer: Self-pay | Admitting: Internal Medicine

## 2011-08-01 ENCOUNTER — Encounter: Payer: Self-pay | Admitting: Internal Medicine

## 2011-08-02 ENCOUNTER — Encounter: Payer: Self-pay | Admitting: Internal Medicine

## 2011-08-02 ENCOUNTER — Ambulatory Visit (INDEPENDENT_AMBULATORY_CARE_PROVIDER_SITE_OTHER): Payer: Medicare Other | Admitting: Internal Medicine

## 2011-08-02 VITALS — BP 116/74 | HR 60 | Ht 67.0 in | Wt 174.1 lb

## 2011-08-02 DIAGNOSIS — I4949 Other premature depolarization: Secondary | ICD-10-CM

## 2011-08-02 DIAGNOSIS — R001 Bradycardia, unspecified: Secondary | ICD-10-CM

## 2011-08-02 DIAGNOSIS — I498 Other specified cardiac arrhythmias: Secondary | ICD-10-CM

## 2011-08-02 NOTE — Assessment & Plan Note (Signed)
Continue current meds 

## 2011-08-02 NOTE — Progress Notes (Signed)
  HPI  [name] is a 50 y.o. 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 She has a history of bradycardia and thus has been treated with Sectral.because of some degree of bradycardia it was changed to pindolol which she is tolerating well. She has occasional palpitations    April 2011 on Myoview scanning which was normal.  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 PVC's (premature ventricular contractions)   . GERD (gastroesophageal reflux disease)   . Chest pain   . RVOT ventricular tachycardia   . DJD (degenerative joint disease)   . Depression   . Migraine headache     Past Surgical History  Procedure Date  . Tubal ligation   . Laminectomy   . Total abdominal hysterectomy   . Cholecystectomy     Current Outpatient Prescriptions  Medication Sig Dispense Refill  . ALPRAZolam (XANAX) 0.5 MG tablet Take 0.5 mg by mouth at bedtime as needed.        Marland Kitchen esomeprazole (NEXIUM) 20 MG capsule Take 20 mg by mouth daily before breakfast.        . HYDROcodone-acetaminophen (LORTAB) 7.5-500 MG per tablet Take 1 tablet by mouth every 4 (four) hours as needed.       . pindolol (VISKEN) 5 MG tablet TAKE 1/2 TABLET BY MOUTH ONCE A DAY  15 tablet  11    No Known Allergies  Review of Systems negative except from HPI and PMH  Physical Exam Well developed and well nourished in no acute distress HENT normal E scleral and icterus clear Neck Supple JVP flat; carotids brisk and full Clear to ausculation Regular rate and rhythm, no murmurs gallops or rub Soft with active bowel sounds No clubbing cyanosis and edema Alert and oriented, grossly normal motor and sensory function Skin Warm and Dry  ECG sinus rhythm at 60 Intervals 0.19/0.08/0.41 Axis is 56  Assessment and  Plan

## 2011-08-02 NOTE — Patient Instructions (Signed)
Your physician wants you to follow-up in: 1 year with Dr. Klein. You will receive a reminder letter in the mail two months in advance. If you don't receive a letter, please call our office to schedule the follow-up appointment.  Your physician recommends that you continue on your current medications as directed. Please refer to the Current Medication list given to you today.  

## 2011-08-02 NOTE — Assessment & Plan Note (Signed)
Stable on pindolol

## 2011-08-06 ENCOUNTER — Encounter: Payer: Self-pay | Admitting: Internal Medicine

## 2011-08-07 ENCOUNTER — Encounter: Payer: Self-pay | Admitting: Internal Medicine

## 2011-08-07 ENCOUNTER — Ambulatory Visit (INDEPENDENT_AMBULATORY_CARE_PROVIDER_SITE_OTHER): Payer: Medicare Other | Admitting: Internal Medicine

## 2011-08-07 ENCOUNTER — Other Ambulatory Visit (HOSPITAL_COMMUNITY): Payer: Self-pay | Admitting: *Deleted

## 2011-08-07 ENCOUNTER — Telehealth: Payer: Self-pay | Admitting: *Deleted

## 2011-08-07 VITALS — BP 132/76 | HR 61 | Temp 98.5°F | Wt 175.0 lb

## 2011-08-07 DIAGNOSIS — M436 Torticollis: Secondary | ICD-10-CM

## 2011-08-07 MED ORDER — ALPRAZOLAM 0.5 MG PO TABS: 0.5000 mg | ORAL_TABLET | Freq: Every evening | ORAL | Status: DC | PRN

## 2011-08-07 NOTE — Telephone Encounter (Signed)
Imaging center called. They will be ordering the following tests:  Protein, glucose, Cell count, ASB, Fungal culture, Fungal smear, bacterial culture, they will hold sample x 2 weeks and send sample to [facility].   If Dr Debby Bud would like to add additional tests or discuss with MD he can call [phone]

## 2011-08-07 NOTE — Telephone Encounter (Signed)
Yep. Perfect. thanks

## 2011-08-08 NOTE — Progress Notes (Signed)
  Subjective:    Patient ID: [name], female    DOB: 01-Aug-1961, 50 y.o.   MRN: [id number]  HPI [name] suffers from chronic back and neck pain and has had surgical intervention in the past. She has been followed by pain management specialist and treatment has included ESI x 3 performed at outpatient surgical center in [facility] in and after July '12. 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 [name] denies having any fever, photophobia but she has had an increase in the severity of her chronic headache and she has increased loss of mobility of her neck.  Past Medical History  Diagnosis Date  . PVC's (premature ventricular contractions)   . GERD (gastroesophageal reflux disease)   . Chest pain   . RVOT ventricular tachycardia   . DJD (degenerative joint disease)   . Depression   . Migraine headache    Past Surgical History  Procedure Date  . Tubal ligation   . Laminectomy   . Total abdominal hysterectomy   . Cholecystectomy    Family History  Problem Relation Age of Onset  . Diabetes Mother     and multiplesclerosis  . Heart disease Father   . Hypertension Sister    History   Social History  . Marital Status: Married    Spouse Name: N/A    Number of Children: N/A  . Years of Education: N/A   Occupational History  . Not on file.   Social History Main Topics  . Smoking status: Former Smoker    Quit date: 10/29/2006  . Smokeless tobacco: Not on file  . Alcohol Use: No  . Drug Use: No  . Sexually Active: Not on file   Other Topics Concern  . Not on file   Social History Narrative   Occupation: retired [employer] and Chemicals DepartmentMarriedAlcohol noDrug use no, no caffieneTobacco use- yes quit Dec '08Regular Exercise no4 children; reports increased stress  in due to caring for grandchildrenUNEMPLOYED       Review of Systems System review is negative for any constitutional, cardiac, pulmonary, GI or neuro symptoms or complaints other than as described in the HPI.     Objective:   Physical Exam Vitals noted - stable BP Gen'l - WNWD overweight AA woman in no distress but is worried HEENT - C&S clear, EOMI Neck - decreased ROM: cannot touch chin to chest, extension limited by stiffness and pain Neuro - normal vision, normal gait and strength.       Assessment & Plan:  Stiff neck - patient has significant exposure risk to contaminated methylprednisolone used for ESI now with stiffness in the neck but no fever, photophobia.  Plan - diagnostic lumbar puncture for cell count, bacterial and fungal cultures, glucose, protein, gram stain - cleared for study without prior CT or MRI brain after discussion with Dr. Alfredo Batty, radiologist.

## 2011-08-09 ENCOUNTER — Telehealth: Payer: Self-pay | Admitting: *Deleted

## 2011-08-09 NOTE — Telephone Encounter (Signed)
Spoke w/patient - She had LP yesterday at [facility], she is sore but was told initial results were negative, she must wait on final testing.

## 2011-09-06 ENCOUNTER — Telehealth: Payer: Self-pay | Admitting: *Deleted

## 2011-09-06 NOTE — Telephone Encounter (Signed)
Pt called and states that she needs to be seen. This message was left on my VM, I tried to call the pt back and it went to her voicemail her VM was full and did not allow a message to be left. Will try to call pt back in the morning

## 2011-09-07 NOTE — Telephone Encounter (Signed)
Called pt for the second time to respond to message that I received yesterday stating that she needed to be seen no answer and voicemail was full

## 2011-09-19 ENCOUNTER — Ambulatory Visit: Payer: Medicare Other | Admitting: Internal Medicine

## 2011-09-19 DIAGNOSIS — Z0289 Encounter for other administrative examinations: Secondary | ICD-10-CM

## 2011-09-25 ENCOUNTER — Ambulatory Visit (HOSPITAL_COMMUNITY)
Admission: RE | Admit: 2011-09-25 | Discharge: 2011-09-25 | Disposition: A | Discharge status: Home / Self Care | Payer: Medicare Other | Source: Ambulatory Visit | Attending: Internal Medicine | Admitting: Internal Medicine

## 2011-09-25 ENCOUNTER — Ambulatory Visit (INDEPENDENT_AMBULATORY_CARE_PROVIDER_SITE_OTHER): Payer: Medicare Other | Admitting: Internal Medicine

## 2011-09-25 ENCOUNTER — Encounter: Payer: Self-pay | Admitting: Internal Medicine

## 2011-09-25 ENCOUNTER — Other Ambulatory Visit (HOSPITAL_COMMUNITY): Payer: Self-pay | Admitting: *Deleted

## 2011-09-25 DIAGNOSIS — R52 Pain, unspecified: Secondary | ICD-10-CM

## 2011-09-25 DIAGNOSIS — G43909 Migraine, unspecified, not intractable, without status migrainosus: Secondary | ICD-10-CM

## 2011-09-25 DIAGNOSIS — M19019 Primary osteoarthritis, unspecified shoulder: Secondary | ICD-10-CM | POA: Insufficient documentation

## 2011-09-25 DIAGNOSIS — M25519 Pain in unspecified shoulder: Secondary | ICD-10-CM | POA: Insufficient documentation

## 2011-09-25 DIAGNOSIS — Z8739 Personal history of other diseases of the musculoskeletal system and connective tissue: Secondary | ICD-10-CM

## 2011-09-25 NOTE — Assessment & Plan Note (Signed)
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 CVA unlikely but possible.  Plan - MRI brain  (already ordered by Dr. Trisha Mangle in relation to spinal meningitis risk)           OK to [place] lortab as needed           Call for persistent or worsening HA

## 2011-09-25 NOTE — Progress Notes (Signed)
  Subjective:    Patient ID: [name], female    DOB: [birth year], 50 y.o.   MRN: [id number]  HPI [name] is having episodes of scotoma-followed by headache. She is having no focal symptoms. She is followed closesly by Dr. Trisha Mangle after she received ESI with contaminated lot of steroids. She is also having a lot of left shoulder pain and weakness. This has been going on for several weeks. She is seeing a pain doctor who is ordering shoulder films.  Past Medical History  Diagnosis Date  . PVC's (premature ventricular contractions)   . GERD (gastroesophageal reflux disease)   . Chest pain   . RVOT ventricular tachycardia   . DJD (degenerative joint disease)   . Depression   . Migraine headache    Past Surgical History  Procedure Date  . Tubal ligation   . Laminectomy   . Total abdominal hysterectomy   . Cholecystectomy    Family History  Problem Relation Age of Onset  . Diabetes Mother     and multiplesclerosis  . Heart disease Father   . Hypertension Sister    History   Social History  . Marital Status: Married    Spouse Name: N/A    Number of Children: N/A  . Years of Education: N/A   Occupational History  . Not on file.   Social History Main Topics  . Smoking status: Former Smoker    Quit date: 10/29/2006  . Smokeless tobacco: Not on file  . Alcohol Use: No  . Drug Use: No  . Sexually Active: Not on file   Other Topics Concern  . Not on file   Social History Narrative   Occupation: retired [employer] and Chemicals DepartmentMarriedAlcohol noDrug use no, no caffieneTobacco use- yes quit Dec '08Regular Exercise no4 children; reports increased stress in due to caring for grandchildrenUNEMPLOYED       Review of Systems System review is negative for any constitutional, cardiac, pulmonary, GI or neuro symptoms or complaints other than as described in the HPI.     Objective:   Physical Exam Vitals - stable Gen'l - WNWD AA woman in no distress  except for shoulder pain HEENT- C&S clear Cor = RRR Pulm - normal respirations Neuro - A&O x 3, CN II-XII normal, MS normal except for left arm/grip, normal cerebellar function, normal DTRs and equal       Assessment & Plan:

## 2011-09-25 NOTE — Assessment & Plan Note (Signed)
Left shoulder pain - no crepitus left shoulder. Concern for recurrent C-spine problems  Plan - per Dr. Manon Hilding - for shoulder films

## 2011-09-25 NOTE — Patient Instructions (Signed)
Visual change is more suggestive of ocular migraine rather than stroke. Plan - agree with having an MRI brain to be sure there is nothing else going on. OK to use Vicodin or even excedrin migraine for these episodes  Shoulder pain - may be c-spine problems vs a shoulder problem. I agree with having shoulder x-rays.

## 2011-11-29 ENCOUNTER — Telehealth: Payer: Self-pay | Admitting: Internal Medicine

## 2011-11-29 NOTE — Telephone Encounter (Signed)
Received copies from [facility],on 1.31.13. Forwarded 3 pages to Dr. Debby Bud ,for review. sj

## 2011-12-14 ENCOUNTER — Other Ambulatory Visit (HOSPITAL_COMMUNITY): Payer: Self-pay | Admitting: Physical Medicine and Rehabilitation

## 2011-12-14 DIAGNOSIS — M75100 Unspecified rotator cuff tear or rupture of unspecified shoulder, not specified as traumatic: Secondary | ICD-10-CM

## 2011-12-17 ENCOUNTER — Other Ambulatory Visit (HOSPITAL_COMMUNITY): Payer: Medicare Other

## 2012-02-06 ENCOUNTER — Telehealth: Payer: Self-pay | Admitting: Internal Medicine

## 2012-02-06 NOTE — Telephone Encounter (Signed)
Received copies from [facility] ,on 02/06/12 . Forwarded  5 pages to Dr. Reva Bores review.

## 2012-02-26 ENCOUNTER — Ambulatory Visit (HOSPITAL_COMMUNITY)
Admission: RE | Admit: 2012-02-26 | Discharge: 2012-02-26 | Disposition: A | Discharge status: Home / Self Care | Payer: Medicare Other | Source: Ambulatory Visit | Attending: Physical Medicine and Rehabilitation | Admitting: Physical Medicine and Rehabilitation

## 2012-02-26 DIAGNOSIS — M6281 Muscle weakness (generalized): Secondary | ICD-10-CM | POA: Insufficient documentation

## 2012-02-26 DIAGNOSIS — M545 Low back pain, unspecified: Secondary | ICD-10-CM | POA: Insufficient documentation

## 2012-02-26 DIAGNOSIS — IMO0001 Reserved for inherently not codable concepts without codable children: Secondary | ICD-10-CM | POA: Insufficient documentation

## 2012-02-26 NOTE — Evaluation (Signed)
Physical Therapy Evaluation  Patient Details  Name: [name] MRN: [id number] Date of Birth: Mar 13, 1961  Today's Date: 02/26/2012 Time: 1350-1430 PT Time Calculation (min): 40 min Charges: 1 eval Visit#: 1  of 12   Re-eval: 03/27/12 (G Code: CL, Goal: CJ) Assessment Diagnosis: LBP Surgical Date: 10/25/11 Next MD Visit: Manon Hilding - June  Authorization: MEDICARE  Authorization Time Period:    Authorization Visit#: 1  of 10    Past Medical History:  Past Medical History  Diagnosis Date  . PVC's (premature ventricular contractions)   . GERD (gastroesophageal reflux disease)   . Chest pain   . RVOT ventricular tachycardia   . DJD (degenerative joint disease)   . Depression   . Migraine headache    Past Surgical History:  Past Surgical History  Procedure Date  . Tubal ligation   . Laminectomy   . Total abdominal hysterectomy   . Cholecystectomy     Subjective Symptoms/Limitations Symptoms: Pt reports that she had back surgery on Dec 27th for a culture to see if she was infected with meningitis that came from a 3 series of steriod injections.  She was being treated (percautionary measures) w/meningitis medication ( had to d/c secondary to hallicinations) is now taking an antif-fungal medication to treat fungus that was found in her spine.  Pt reports  she has to recieve an MRI every 3 months to check for meningitis infection. Denies N&V or fever. She also reports that she has increased pain and tenderness to her cervical region over the past few months.  Her c/co is LBP and cervical pain which limits her in participating in most activities. She had a cervical neck fusion '99 and '04 Fusion from C1-C7.  She has most of her pain to her L shoulder and arm.  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 Leisure: Hobbies-yes (Comment) Comments: She enjoys being with her family and grandchildren  Cognition/Observation Observation/Other Assessments Observations: Impaired coordination with squats and R and L SL squats  Other Assessments: Gower sign w/return to stand  Sensation/Coordination/Flexibility/Functional Tests Functional Tests Functional Tests: Oswestry LB disability Index (ODI) 68%  Assessment RLE Strength Right Hip Flexion: 3/5 Right Hip Extension: 2/5 Right Hip ABduction: 2+/5 Right Knee Flexion: 3+/5 Right Knee Extension: 3/5 LLE Strength Left Hip Flexion: 3/5 Left Hip Extension: 2/5 Left Hip ABduction: 2+/5 Left Knee Flexion: 3-/5 Left Knee Extension: 3/5 Lumbar Strength Overall Lumbar Strength Comments: Hodges Score: Multifidus 2/10, TrA  2/10 Palpation Palpation: Signficiant pain and tenderness to her low back and gluteal region (L>R) w/muscular spasms   Mobility/Balance  Static Standing Balance Single Leg Stance - Right Leg: 4  Single Leg Stance - Left Leg: 9  Tandem Stance - Right Leg: 8  Tandem Stance - Left Leg: 7  Rhomberg - Eyes Opened: 10  Rhomberg - Eyes Closed: 10  (increased sway)  Gait: Antalgic w/decreased stride length and decreased stance time on RLE  Exercise/Treatments  Supine Other Supine Lumbar Exercises: NMR and instruction on diaphragmatic breathing Prone  Other Prone Lumbar Exercises: NMR and instruction on multifidus re-education     Physical Therapy Assessment and Plan PT Assessment and Plan Clinical Impression Statement: Pt is a 51 year old female referred to PT for cervical and lumbar  pain from recent lumbar surgery on 12/27 in order to take sample for a possible menengitis infection which she recieved from steroid injections into her low back to control pain.  Pt will benefit from skilled therapeutic intervention in order to improve on the  following deficits: Decreased strength;Decreased range of motion;Decreased activity tolerance;Decreased balance;Decreased mobility;Decreased coordination;Increased fascial restricitons;Pain;Other (comment) (impaired percieved functional ability) Rehab Potential: Good PT Frequency: Min 3X/week PT Duration: 8 weeks PT Treatment/Interventions: Gait training;Stair training;Functional mobility training;Therapeutic activities;Therapeutic exercise;Balance training;Neuromuscular re-education;Patient/family education;Other (comment) (manual techniques, joint mobs, modalities for pain) PT Plan: Start with core training. Add TM when able    Goals Home Exercise Program Pt will Perform Home Exercise Program: Independently PT Goal: Perform Home Exercise Program - Progress: Goal set today PT Short Term Goals Time to Complete Short Term Goals: 2 weeks PT Short Term Goal 1: Pt will report LBP less than 4/10 for 50% of her day PT Short Term Goal 2: Pt will improve her TrA and Multifidus contraction to a 5/10 on Hodges Scale. PT Short Term Goal 3: Pt will improve her LE strength by 1/2 muscle grade.   PT Short Term Goal 4: Pt will improve her muscular endurance and tolerate ambulating for 15 minutes.  PT Long Term Goals Time to Complete Long Term Goals: 8 weeks PT Long Term Goal 1: Pt will report pain less than 3/10 for 75% of her day for improved QOL. PT Long Term Goal 2: Pt will improve core endurance and tolerate walking and standing for greater than 30 minutes in order to participate in lesuire activities.  Long Term Goal 3: Pt will improve LE strength and endurance in order to ascend and descend 5 stairs w/1 handrail in order to enter family and friends home.  Long Term Goal 4: Pt will improve her ODI to less than 58% for improved percieved functional ability.   Problem List Patient Active Problem List  Diagnoses  . ANXIETY STATE, UNSPECIFIED  . MIGRAINE HEADACHE  . PREMATURE VENTRICULAR CONTRACTIONS    . SINUSITIS- ACUTE-NOS  . GASTROESOPHAGEAL REFLUX DISEASE  . ABSCESS-ANORECTAL  . ANAL PRURITUS  . CHEST PAIN  . ELECTROCARDIOGRAM, ABNORMAL  . DEGENERATIVE DISC DISEASE, CERVICAL SPINE, HX OF  . TOTAL ABDOMINAL HYSTERECTOMY, HX OF  . CHOLECYSTECTOMY, HX OF  . Bradycardia  . Lumbago    PT Plan of Care PT Home Exercise Plan: Diaphragmatic breathing and Multifidus contraction PT Patient Instructions: Importance of HEP and how to decrease her anxiety Consulted and Agree with Plan of Care: Patient  GP  Functional Reporting Modifier  Current Status  [phone] - Mobility: Walking & Moving Around CL - At least 60% but less than 80% impaired, limited or restricted  Goal Status  [id number] - Mobility: [place] & Moving Around CJ - At least 20% but less than 40% impaired, limited or restricted   Cinnamon Morency 02/26/2012, 4:51 PM  Physician Documentation Your signature is required to indicate approval of the treatment plan as stated above.  Please sign and either send electronically or make a copy of this report for your files and return this physician signed original.   Please mark one 1.__approve of plan  2. ___approve of plan with the following conditions.   ______________________________  _____________________ Physician Signature                                                                                                             Date

## 2012-02-27 ENCOUNTER — Ambulatory Visit (HOSPITAL_COMMUNITY): Payer: Medicare Other

## 2012-02-29 ENCOUNTER — Ambulatory Visit (HOSPITAL_COMMUNITY)
Admission: RE | Admit: 2012-02-29 | Discharge: 2012-02-29 | Disposition: A | Discharge status: Home / Self Care | Payer: Medicare Other | Source: Ambulatory Visit | Attending: Internal Medicine | Admitting: Internal Medicine

## 2012-02-29 ENCOUNTER — Ambulatory Visit (HOSPITAL_COMMUNITY): Payer: Medicare Other

## 2012-02-29 DIAGNOSIS — M6281 Muscle weakness (generalized): Secondary | ICD-10-CM | POA: Insufficient documentation

## 2012-02-29 DIAGNOSIS — IMO0001 Reserved for inherently not codable concepts without codable children: Secondary | ICD-10-CM | POA: Insufficient documentation

## 2012-02-29 DIAGNOSIS — M545 Low back pain, unspecified: Secondary | ICD-10-CM | POA: Insufficient documentation

## 2012-02-29 NOTE — Progress Notes (Addendum)
Physical Therapy Treatment Patient Details  Name: [name] MRN: [id number] Date of Birth: 02-14-1961  Today's Date: 02/29/2012 Time: 0347-4259 PT Time Calculation (min): 38 min Charges: NMR x30', Man: x8 Authorization: MEDICARE  Authorization Time Period: G Code: CL, Goal: CJ  Authorization Visit#: 2  of 10    Subjective: Symptoms/Limitations Symptoms: Pt reports that she is somewhat better.  Pain Assessment Currently in Pain?: Yes Pain Score:   7 (7/10 before, 5/10 after tx) Pain Location: Back Pain Type: Acute pain  Exercise/Treatments Seated Other Seated Lumbar Exercises: Heel and Toe Roll in and outs during MFR to cervical region 10x10 sec holds each Supine Ab Set: Limitations AB Set Limitations: NMR to TrA to improve core control, able to hold 1x10 sec after NMR Other Supine Lumbar Exercises: NMR to complete PFC contractions 2x10 sec, w.legs elevated on bolster using VC's TC's (to TrA ) and Visual cueing to L side Prone  Other Prone Lumbar Exercises: NMR and instruction on multifidus re-education.  Using TC's VC's and visual cuing to activiate L multifidus.   Manual Therapy Manual Therapy: Myofascial release Myofascial Release: to middle of cervical region over scar tissue while patient completed core exercises in sitting.  Physical Therapy Assessment and Plan PT Assessment and Plan Clinical Impression Statement: Treatment focus on NM control to core musculature with diaphragmatic breathing utilized during training.  Pt had improved R sided core strength, continues to require visual cueing for L side weakness.  Reports decreased pain after treatment today.  PT Plan: Cont with core training. Add TM when able     Goals    Problem List Patient Active Problem List  Diagnoses  . ANXIETY STATE, UNSPECIFIED  . MIGRAINE HEADACHE  . PREMATURE VENTRICULAR CONTRACTIONS  . SINUSITIS- ACUTE-NOS  . GASTROESOPHAGEAL REFLUX DISEASE  . ABSCESS-ANORECTAL  . ANAL PRURITUS    . CHEST PAIN  . ELECTROCARDIOGRAM, ABNORMAL  . DEGENERATIVE DISC DISEASE, CERVICAL SPINE, HX OF  . TOTAL ABDOMINAL HYSTERECTOMY, HX OF  . CHOLECYSTECTOMY, HX OF  . Bradycardia  . Lumbago    PT - End of Session Activity Tolerance: Patient tolerated treatment well  GP No functional reporting required  [name] 02/29/2012, 4:01 PM

## 2012-03-04 ENCOUNTER — Ambulatory Visit (HOSPITAL_COMMUNITY): Payer: Medicare Other | Admitting: *Deleted

## 2012-03-04 ENCOUNTER — Ambulatory Visit (HOSPITAL_COMMUNITY): Payer: Medicare Other

## 2012-03-05 ENCOUNTER — Ambulatory Visit (HOSPITAL_COMMUNITY): Payer: Medicare Other | Admitting: Physical Therapy

## 2012-03-05 ENCOUNTER — Telehealth (HOSPITAL_COMMUNITY): Payer: Self-pay

## 2012-03-06 ENCOUNTER — Ambulatory Visit (HOSPITAL_COMMUNITY): Payer: Medicare Other | Admitting: Physical Therapy

## 2012-03-11 ENCOUNTER — Inpatient Hospital Stay (HOSPITAL_COMMUNITY): Admission: RE | Admit: 2012-03-11 | Payer: Medicare Other | Source: Ambulatory Visit

## 2012-03-12 ENCOUNTER — Ambulatory Visit (HOSPITAL_COMMUNITY): Payer: Medicare Other | Admitting: Physical Therapy

## 2012-03-12 ENCOUNTER — Ambulatory Visit (HOSPITAL_COMMUNITY): Payer: Medicare Other | Admitting: *Deleted

## 2012-03-14 ENCOUNTER — Ambulatory Visit (HOSPITAL_COMMUNITY): Payer: Medicare Other

## 2012-03-17 ENCOUNTER — Ambulatory Visit (HOSPITAL_COMMUNITY): Payer: Medicare Other | Admitting: Physical Therapy

## 2012-03-19 ENCOUNTER — Ambulatory Visit (HOSPITAL_COMMUNITY): Payer: Medicare Other

## 2012-03-21 ENCOUNTER — Ambulatory Visit (HOSPITAL_COMMUNITY): Payer: Medicare Other

## 2012-04-03 ENCOUNTER — Other Ambulatory Visit: Payer: Self-pay | Admitting: Internal Medicine

## 2012-04-04 NOTE — Telephone Encounter (Signed)
Patient request refill on medication Xanax. Last office visit 09/25/2011

## 2012-04-07 NOTE — Telephone Encounter (Signed)
Refill Rx alprazolam called to [facility]. Patient notified.

## 2012-04-21 ENCOUNTER — Encounter: Payer: Self-pay | Admitting: Internal Medicine

## 2012-04-21 ENCOUNTER — Ambulatory Visit (INDEPENDENT_AMBULATORY_CARE_PROVIDER_SITE_OTHER): Payer: Medicare Other | Admitting: Internal Medicine

## 2012-04-21 VITALS — BP 124/82 | HR 64 | Temp 98.1°F | Resp 16 | Wt 164.5 lb

## 2012-04-21 DIAGNOSIS — K219 Gastro-esophageal reflux disease without esophagitis: Secondary | ICD-10-CM

## 2012-04-21 MED ORDER — ESOMEPRAZOLE MAGNESIUM 40 MG PO CPDR: 40.0000 mg | DELAYED_RELEASE_CAPSULE | Freq: Every day | ORAL | Status: DC

## 2012-04-21 NOTE — Assessment & Plan Note (Signed)
Patient with epigastric pain and tenderness. She has been off PPI (Nexium). No hematemesis, hematochezia. 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  Plan- resume Nexium 40 mg BID for several days then qAM

## 2012-04-21 NOTE — Progress Notes (Signed)
  Subjective:    Patient ID: [name], female    DOB: 1961-03-23, 51 y.o.   MRN: [id number]  HPI [place] being followed for possible meningitis from contaiminated steroid injection - followed at [facility].  She c/o weakness, fatigue, no fever, no diarrhea, mild upset stomach and loss of appetite, mild weight loss - 8 lbs. She is still having pain across the chest and in the arm for which she will take occasional Hydrocodone. She has had epigastric type pain - she is off PPI therapy.   Past Medical History  Diagnosis Date  . PVC's (premature ventricular contractions)   . GERD (gastroesophageal reflux disease)   . Chest pain   . RVOT ventricular tachycardia   . DJD (degenerative joint disease)   . Depression   . Migraine headache    Past Surgical History  Procedure Date  . Tubal ligation   . Laminectomy   . Total abdominal hysterectomy   . Cholecystectomy    Family History  Problem Relation Age of Onset  . Diabetes Mother     and multiplesclerosis  . Heart disease Father   . Hypertension Sister    History   Social History  . Marital Status: Married    Spouse Name: N/A    Number of Children: N/A  . Years of Education: N/A   Occupational History  . Not on file.   Social History Main Topics  . Smoking status: Current Everyday Smoker    Last Attempt to Quit: 10/29/2006  . Smokeless tobacco: Not on file  . Alcohol Use: No  . Drug Use: No  . Sexually Active: Not on file   Other Topics Concern  . Not on file   Social History Narrative   Occupation: retired [employer] and Chemicals DepartmentMarriedAlcohol noDrug use no, no caffieneTobacco use- yes quit Dec '08Regular Exercise no4 children; reports increased stress in due to caring for grandchildrenUNEMPLOYED    Current Outpatient Prescriptions on File Prior to Visit  Medication Sig Dispense Refill  . ALPRAZolam (XANAX) 0.5 MG tablet take 1 tablet by mouth at bedtime if needed  30 tablet  5  .  HYDROcodone-acetaminophen (LORTAB) 7.5-500 MG per tablet Take 1 tablet by mouth every 4 (four) hours as needed.       . pindolol (VISKEN) 5 MG tablet TAKE 1/2 TABLET BY MOUTH ONCE A DAY  15 tablet  11  . esomeprazole (NEXIUM) 20 MG capsule Take 20 mg by mouth daily before breakfast.            Review of Systems System review is negative for any constitutional, cardiac, pulmonary, GI or neuro symptoms or complaints other than as described in the HPI.     Objective:   Physical Exam Filed Vitals:   04/21/12 1321  BP: 124/82  Pulse: 64  Temp: 98.1 F (36.7 C)  Resp: 16   Wt Readings from Last 3 Encounters:  04/21/12 164 lb 8 oz (74.617 kg)  09/25/11 172 lb (78.019 kg)  08/07/11 175 lb (79.379 kg)   Gen'l- overweight AA woman in no acute distress HEENT- C&S clear, PERRLA, EOMI Neck- supple Nodes - negative neck Cor- 2+ radial, RRR Pulm - normal respirations, no rales or wheezes Abd- BS+ x 4, though slightly hypoactive, no HSM, no guarding or rebound, mild tenderness in the epigastrum Neuro - A&O x 3, CN II-XII normal, normal gait and station.       Assessment & Plan:

## 2012-04-21 NOTE — Patient Instructions (Addendum)
Exam is normal except for sinus tenderness. NO evidence of infection with no fever or snot. May be serious congestion. Also with tenderness in the stomach related to recurrent acid dyspepsia.  Plan 1. Resume nexium 40 mg wevery AM  2. Sinus congestion but no evidence of bacterial infection - take sudafed (generic) 30 mg three times a day. Heat  3. Get lab when next at Dr. Uvaldo Bristle office  4. Call for fever or purulent nasal drainage.

## 2012-04-25 ENCOUNTER — Telehealth: Payer: Self-pay | Admitting: *Deleted

## 2012-04-25 NOTE — Telephone Encounter (Signed)
Office notes sent to Dr. Madelin Headings at [facility] [place]. [phone] fax

## 2012-04-28 ENCOUNTER — Other Ambulatory Visit: Payer: Self-pay | Admitting: Internal Medicine

## 2012-04-28 ENCOUNTER — Telehealth: Payer: Self-pay | Admitting: Internal Medicine

## 2012-04-28 MED ORDER — RANITIDINE HCL 150 MG PO TABS: 150.0000 mg | ORAL_TABLET | Freq: Two times a day (BID) | ORAL | Status: DC

## 2012-04-28 NOTE — Telephone Encounter (Signed)
Called patient:  1. Stop nexium 2. Zantac 150 mg BID , chase with diet coke 3. Contact Dr. Trisha Mangle for follow-up of spine.

## 2012-04-28 NOTE — Telephone Encounter (Signed)
Documentation of note  Faxed to Dr. Terie Purser office, [place]

## 2012-06-23 ENCOUNTER — Telehealth: Payer: Self-pay | Admitting: Internal Medicine

## 2012-06-23 NOTE — Telephone Encounter (Signed)
Forward 5 pages from [facility] to Dr. Illene Regulus for review on 06-23-12 ym

## 2012-07-21 ENCOUNTER — Telehealth: Payer: Self-pay | Admitting: Internal Medicine

## 2012-07-21 NOTE — Telephone Encounter (Signed)
Forward 4 pages from [facility] to Illene Regulus for review on 07-21-12 ym

## 2012-07-25 ENCOUNTER — Encounter: Payer: Self-pay | Admitting: Gastroenterology

## 2012-08-08 ENCOUNTER — Other Ambulatory Visit: Payer: Self-pay | Admitting: Internal Medicine

## 2012-09-02 ENCOUNTER — Other Ambulatory Visit: Payer: Self-pay | Admitting: Physical Medicine and Rehabilitation

## 2012-09-02 DIAGNOSIS — R9089 Other abnormal findings on diagnostic imaging of central nervous system: Secondary | ICD-10-CM

## 2012-09-02 DIAGNOSIS — G44009 Cluster headache syndrome, unspecified, not intractable: Secondary | ICD-10-CM

## 2012-09-08 ENCOUNTER — Other Ambulatory Visit: Payer: Medicare Other

## 2012-10-23 HISTORY — PX: BACK SURGERY: SHX140

## 2012-11-07 ENCOUNTER — Encounter: Payer: Self-pay | Admitting: Gastroenterology

## 2012-11-24 ENCOUNTER — Encounter: Payer: Self-pay | Admitting: Internal Medicine

## 2012-11-24 ENCOUNTER — Ambulatory Visit (INDEPENDENT_AMBULATORY_CARE_PROVIDER_SITE_OTHER): Payer: 59 | Admitting: Internal Medicine

## 2012-11-24 VITALS — BP 118/62 | HR 76 | Temp 98.6°F | Resp 10 | Wt 162.1 lb

## 2012-11-24 DIAGNOSIS — F17200 Nicotine dependence, unspecified, uncomplicated: Secondary | ICD-10-CM

## 2012-11-24 DIAGNOSIS — B375 Candidal meningitis: Secondary | ICD-10-CM

## 2012-11-24 DIAGNOSIS — Z23 Encounter for immunization: Secondary | ICD-10-CM

## 2012-11-24 DIAGNOSIS — R0981 Nasal congestion: Secondary | ICD-10-CM

## 2012-11-24 DIAGNOSIS — J3489 Other specified disorders of nose and nasal sinuses: Secondary | ICD-10-CM

## 2012-11-24 DIAGNOSIS — Z720 Tobacco use: Secondary | ICD-10-CM

## 2012-11-24 MED ORDER — INFLUENZA VIRUS VACC SPLIT PF IM SUSP: 0.5000 mL | INTRAMUSCULAR | Status: AC

## 2012-11-24 NOTE — Patient Instructions (Addendum)
Sinus congestion - no evidence of infection. The pain is from sinus pressure as well as closure of the eustachian tube causing ear pressure.  Plan  pseudoephedrin 30 mg two or three times a day for the pressure  claritin is for drainage.   Stop Smoking!!!!! Please

## 2012-11-24 NOTE — Progress Notes (Signed)
Subjective:    Patient ID: KENSI KARR, female    DOB: Nov 28, 1960, 52 y.o.   MRN: [id number]  HPI Rosina is still seeing Dr. 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 She had last MRI Dec. '13.  She is having upper respiratory symptoms with sinus pressure and ear pressure discomfort. No fever, no chills, no purulent drainage. Lots of facial pressure and ear popping.  Past Medical History  Diagnosis Date  . PVC's (premature ventricular contractions)   . GERD (gastroesophageal reflux disease)   . Chest pain   . RVOT ventricular tachycardia   . DJD (degenerative joint disease)   . Depression   . Migraine headache    Past Surgical History  Procedure Date  . Tubal ligation   . Laminectomy   . Total abdominal hysterectomy   . Cholecystectomy   . Back surgery 10/23/12  . Mri     every 3 mths    Family History  Problem Relation Age of Onset  . Diabetes Mother     and multiplesclerosis  . Heart disease Father   . Hypertension Sister    History   Social History  . Marital Status: Married    Spouse Name: N/A    Number of Children: N/A  . Years of Education: N/A   Occupational History  . Not on file.   Social History Main Topics  . Smoking status: Current Every Day Smoker    Last Attempt to Quit: 10/29/2006  . Smokeless tobacco: Not on file  . Alcohol Use: No  . Drug Use: No  . Sexually Active: Not on file   Other Topics Concern  . Not on file   Social History Narrative   Occupation: retired [employer] DepartmentMarriedAlcohol noDrug use no, no caffieneTobacco use- yes quit Dec '08Regular Exercise no4 children; reports increased stress in due to caring for grandchildrenUNEMPLOYED    Current Outpatient Prescriptions on File Prior to Visit  Medication Sig Dispense Refill  . ALPRAZolam (XANAX) 0.5 MG tablet take 1 tablet by mouth at bedtime if needed  30 tablet  5  . esomeprazole (NEXIUM) 40 MG capsule Take 1  capsule (40 mg total) by mouth daily before breakfast.  30 capsule  11  . HYDROcodone-acetaminophen (LORTAB) 7.5-500 MG per tablet Take 1 tablet by mouth every 4 (four) hours as needed.       . pindolol (VISKEN) 5 MG tablet TAKE 1/2 TABLET BY MOUTH ONCE A DAY  15 tablet  11  . ranitidine (ZANTAC) 150 MG tablet Take 1 tablet (150 mg total) by mouth 2 (two) times daily.  60 tablet  11      Review of Systems System review is negative for any constitutional, cardiac, pulmonary, GI or neuro symptoms or complaints other than as described in the HPI.      Objective:   Physical Exam Filed Vitals:   11/24/12 1549  BP: 118/62  Pulse: 76  Temp: 98.6 F (37 C)  Resp: 10   Wt Readings from Last 3 Encounters:  11/24/12 162 lb 1.9 oz (73.537 kg)  04/21/12 164 lb 8 oz (74.617 kg)  09/25/11 172 lb (78.019 kg)   Gen'l- WNWD heavy-set AA woman in no acute distress HEENT - tender to percussion over the frontal and maxillary sinus. Cor- RRR PUlm - normal respirtations       Assessment & Plan:  Sinus congestion - no evidence of infection. The pain is from sinus pressure as well as closure of  the eustachian tube causing ear pressure.  Plan  pseudoephedrin 30 mg two or three times a day for the pressure  claritin is for drainage.   Stop Smoking!!!!! Please

## 2012-11-25 DIAGNOSIS — Z720 Tobacco use: Secondary | ICD-10-CM | POA: Insufficient documentation

## 2012-11-25 DIAGNOSIS — B375 Candidal meningitis: Secondary | ICD-10-CM | POA: Insufficient documentation

## 2012-12-16 ENCOUNTER — Encounter: Payer: Medicare Other | Admitting: Gastroenterology

## 2013-01-06 ENCOUNTER — Encounter: Payer: Self-pay | Admitting: Gastroenterology

## 2013-01-12 ENCOUNTER — Other Ambulatory Visit: Payer: Self-pay | Admitting: Internal Medicine

## 2013-01-26 ENCOUNTER — Telehealth: Payer: Self-pay | Admitting: Internal Medicine

## 2013-01-26 NOTE — Telephone Encounter (Signed)
Rec'd from [facility] forward 4 pages to Dr.Norins

## 2013-02-28 ENCOUNTER — Emergency Department (HOSPITAL_COMMUNITY): Payer: Medicare Other

## 2013-02-28 ENCOUNTER — Emergency Department (HOSPITAL_COMMUNITY)
Admission: EM | Admit: 2013-02-28 | Discharge: 2013-02-28 | Disposition: A | Discharge status: Home / Self Care | Payer: Medicare Other | Attending: Emergency Medicine | Admitting: Emergency Medicine

## 2013-02-28 ENCOUNTER — Encounter (HOSPITAL_COMMUNITY): Payer: Self-pay | Admitting: *Deleted

## 2013-02-28 DIAGNOSIS — M549 Dorsalgia, unspecified: Secondary | ICD-10-CM | POA: Insufficient documentation

## 2013-02-28 DIAGNOSIS — Z9889 Other specified postprocedural states: Secondary | ICD-10-CM | POA: Insufficient documentation

## 2013-02-28 DIAGNOSIS — R5383 Other fatigue: Secondary | ICD-10-CM | POA: Insufficient documentation

## 2013-02-28 DIAGNOSIS — Z8679 Personal history of other diseases of the circulatory system: Secondary | ICD-10-CM | POA: Insufficient documentation

## 2013-02-28 DIAGNOSIS — R209 Unspecified disturbances of skin sensation: Secondary | ICD-10-CM | POA: Insufficient documentation

## 2013-02-28 DIAGNOSIS — Z79899 Other long term (current) drug therapy: Secondary | ICD-10-CM | POA: Insufficient documentation

## 2013-02-28 DIAGNOSIS — M542 Cervicalgia: Secondary | ICD-10-CM | POA: Insufficient documentation

## 2013-02-28 DIAGNOSIS — M25569 Pain in unspecified knee: Secondary | ICD-10-CM | POA: Insufficient documentation

## 2013-02-28 DIAGNOSIS — F3289 Other specified depressive episodes: Secondary | ICD-10-CM | POA: Insufficient documentation

## 2013-02-28 DIAGNOSIS — Z8669 Personal history of other diseases of the nervous system and sense organs: Secondary | ICD-10-CM | POA: Insufficient documentation

## 2013-02-28 DIAGNOSIS — F329 Major depressive disorder, single episode, unspecified: Secondary | ICD-10-CM | POA: Insufficient documentation

## 2013-02-28 DIAGNOSIS — F17200 Nicotine dependence, unspecified, uncomplicated: Secondary | ICD-10-CM | POA: Insufficient documentation

## 2013-02-28 DIAGNOSIS — K219 Gastro-esophageal reflux disease without esophagitis: Secondary | ICD-10-CM | POA: Insufficient documentation

## 2013-02-28 DIAGNOSIS — R5381 Other malaise: Secondary | ICD-10-CM | POA: Insufficient documentation

## 2013-02-28 DIAGNOSIS — M25562 Pain in left knee: Secondary | ICD-10-CM

## 2013-02-28 DIAGNOSIS — Z8739 Personal history of other diseases of the musculoskeletal system and connective tissue: Secondary | ICD-10-CM | POA: Insufficient documentation

## 2013-02-28 DIAGNOSIS — IMO0002 Reserved for concepts with insufficient information to code with codable children: Secondary | ICD-10-CM | POA: Insufficient documentation

## 2013-02-28 LAB — CBC
HCT: 40.4 % (ref 36.0–46.0)
Hemoglobin: 13.6 g/dL (ref 12.0–15.0)
MCH: 29.5 pg (ref 26.0–34.0)
MCHC: 33.7 g/dL (ref 30.0–36.0)
MCV: 87.6 fL (ref 78.0–100.0)
Platelets: 385 10*3/uL (ref 150–400)
RBC: 4.61 MIL/uL (ref 3.87–5.11)
RDW: 13 % (ref 11.5–15.5)
WBC: 7.4 10*3/uL (ref 4.0–10.5)

## 2013-02-28 LAB — BASIC METABOLIC PANEL
BUN: 9 mg/dL (ref 6–23)
CO2: 27 mEq/L (ref 19–32)
Calcium: 9.4 mg/dL (ref 8.4–10.5)
Chloride: 101 mEq/L (ref 96–112)
Creatinine, Ser: 0.68 mg/dL (ref 0.50–1.10)
GFR calc Af Amer: >90 mL/min (ref >90)
GFR calc non Af Amer: >90 mL/min (ref >90)
Glucose, Bld: 82 mg/dL (ref 70–99)
Potassium: 4.3 mEq/L (ref 3.5–5.1)
Sodium: 135 mEq/L (ref 135–145)

## 2013-02-28 MED ORDER — METHYLPREDNISOLONE 4 MG PO KIT: PACK | ORAL | Status: DC

## 2013-02-28 MED ORDER — ONDANSETRON HCL 4 MG PO TABS
4.0000 mg | ORAL_TABLET | Freq: Once | ORAL | Status: AC
  Administered 2013-02-28: 4 mg via ORAL
  Filled 2013-02-28: qty 1

## 2013-02-28 MED ORDER — HYDROCODONE-ACETAMINOPHEN 5-325 MG PO TABS
1.0000 | ORAL_TABLET | Freq: Once | ORAL | Status: AC
  Administered 2013-02-28: 1 via ORAL
  Filled 2013-02-28: qty 1

## 2013-02-28 MED ORDER — OXYCODONE-ACETAMINOPHEN 5-325 MG PO TABS: 1.0000 | ORAL_TABLET | Freq: Once | ORAL | Status: DC

## 2013-02-28 NOTE — ED Provider Notes (Signed)
History     CSN: [id number]  Arrival date & time 02/28/13  1358   First MD Initiated Contact with Patient 02/28/13 1554      Chief Complaint  Patient presents with  . left sided weakness    . left sided pain     (Consider location/radiation/quality/duration/timing/severity/associated sxs/prior treatment) HPI  Patient is a 52 year old female past medical history significant for degenerative joint disease, laminectomy, 2 cervical fusion surgeries last one being in 2003 presenting with multiple complaints today. The first complaint in the main reason patient came into the ED is for left knee pain without radiation that started this morning after patient felt her leg lock while walking. Patient noticed swelling in the knee and has sharp pain that is worsened with straightening her leg and ambulating. She denies any previous history of injuring her left knee. She has not tried anything for the pain today. Patient's second complaint is worsening left arm and left leg pain, numbness, tingling over the last 5-6 months. Patient has had associated left-sided neck and shoulder pain, without posterior midline pain in the C-spine area, with lumbar midline pain. Patient has not followed up with her surgeon regarding these issues. Denies fever, chills, chest pain, shortness of breath, nausea, vomiting, diarrhea, slurred speech.  Past Medical History  Diagnosis Date  . PVC's (premature ventricular contractions)   . GERD (gastroesophageal reflux disease)   . Chest pain   . RVOT ventricular tachycardia   . DJD (degenerative joint disease)   . Depression   . Migraine headache     Past Surgical History  Procedure Laterality Date  . Tubal ligation    . Laminectomy    . Total abdominal hysterectomy    . Cholecystectomy    . Back surgery  10/23/12  . Mri      every 3 mths     Family History  Problem Relation Age of Onset  . Diabetes Mother     and multiplesclerosis  . Heart disease Father   .  Hypertension Sister     History  Substance Use Topics  . Smoking status: Current Every Day Smoker    Last Attempt to Quit: 10/29/2006  . Smokeless tobacco: Not on file  . Alcohol Use: No    OB History   Grav Para Term Preterm Abortions TAB SAB Ect Mult Living                  Review of Systems  Constitutional: Negative for fever, chills and fatigue.  HENT: Positive for neck pain and neck stiffness. Negative for sore throat, drooling and trouble swallowing.   Eyes: Negative for visual disturbance.  Respiratory: Negative for cough, chest tightness and shortness of breath.   Cardiovascular: Negative for chest pain and leg swelling.  Gastrointestinal: Negative for nausea, vomiting, abdominal pain, diarrhea and constipation.  Genitourinary: Negative.   Musculoskeletal: Positive for myalgias, back pain and joint swelling.  Skin: Negative.   Neurological: Negative.     Allergies  Review of patient's allergies indicates no known allergies.  Home Medications   Current Outpatient Rx  Name  Route  Sig  Dispense  Refill  . ALPRAZolam (XANAX) 0.5 MG tablet      take 1 tablet by mouth at bedtime if needed   30 tablet   5   . HYDROcodone-acetaminophen (NORCO) 7.5-325 MG per tablet   Oral   Take 1 tablet by mouth every 6 (six) hours as needed for pain.         Marland Kitchen  itraconazole (SPORANOX) 100 MG capsule   Oral   Take 200 mg by mouth 2 (two) times daily.         . ranitidine (ZANTAC) 150 MG tablet   Oral   Take 1 tablet (150 mg total) by mouth 2 (two) times daily.   60 tablet   11   . methylPREDNISolone (MEDROL, PAK,) 4 MG tablet      follow package directions   21 tablet   0     BP 116/76  Pulse 77  Temp(Src) 98.1 F (36.7 C) (Oral)  Resp 17  SpO2 100%  Physical Exam  Constitutional: She is oriented to person, place, and time. She appears well-developed and well-nourished.  HENT:  Head: Normocephalic and atraumatic.  Eyes: EOM are normal. Pupils are equal,  round, and reactive to light.  Neck: Neck supple. No tracheal deviation present.  ROM decreased, but at patient's baseline.   Cardiovascular: Normal rate, regular rhythm and normal heart sounds.   Pulses:      Dorsalis pedis pulses are 2+ on the right side, and 2+ on the left side.       Posterior tibial pulses are 2+ on the right side, and 2+ on the left side.  Pulmonary/Chest: Effort normal and breath sounds normal.  Abdominal: Soft. Bowel sounds are normal. There is no tenderness.  Musculoskeletal:       Left shoulder: She exhibits tenderness.       Right knee: Normal.       Left knee: She exhibits decreased range of motion and swelling. She exhibits no ecchymosis, no laceration, no erythema, normal alignment, normal patellar mobility, no bony tenderness and normal meniscus. No tenderness found. No medial joint line, no lateral joint line, no MCL, no LCL and no patellar tendon tenderness noted.       Cervical back: She exhibits decreased range of motion and tenderness. She exhibits no bony tenderness, no swelling and no edema.       Thoracic back: Normal.       Lumbar back: She exhibits decreased range of motion, bony tenderness and pain. She exhibits no tenderness, no swelling and no deformity.  Well-healed Surgical scar noted at C-spine and lumbar spine. Minimal decreased range of motion in lumbar spine due to pain and chronic back issues.  Lymphadenopathy:    She has no cervical adenopathy.  Neurological: She is alert and oriented to person, place, and time.  Skin: Skin is warm and dry.  Psychiatric: She has a normal mood and affect.    ED Course  Procedures (including critical care time)  Medications  ondansetron (ZOFRAN) tablet 4 mg (4 mg Oral Given 02/28/13 1705)  HYDROcodone-acetaminophen (NORCO/VICODIN) 5-325 MG per tablet 1 tablet (1 tablet Oral Given 02/28/13 1706)     Date: 02/28/2013  Rate: 66  Rhythm: normal sinus rhythm  QRS Axis: normal  Intervals: normal  ST/T Wave  abnormalities: normal  Conduction Disutrbances:none  Narrative Interpretation:   Old EKG Reviewed: changes noted and intermittent PVCs on last EKG     Labs Reviewed  CBC  BASIC METABOLIC PANEL   Mr Cervical Spine Wo Contrast  02/28/2013  *RADIOLOGY REPORT*  Clinical Data: Neck pain with left arm weakness.  Remote cervical fusion.  MRI CERVICAL SPINE WITHOUT CONTRAST  Technique:  Multiplanar and multiecho pulse sequences of the cervical spine, to include the craniocervical junction and cervicothoracic junction, were obtained according to standard protocol without intravenous contrast.  Comparison: Cervical spine radiographs 05/28/2010.  MRI  10/11/2007.  Findings: Patient is status post extensive cervical fusion.  There is an anterior plate and screws extending from C4- C7.  There are bilateral pedicle screws extending from C3-C5.  The alignment is stable.  There is no evidence of acute fracture or paraspinal abnormality.  The craniocervical junction appears normal.  The cervical cord is mildly atrophied without focal signal abnormality.  There are bilateral vertebral artery flow voids.  There are stable degenerative changes anteriorly at C1-C2.  C2-C3:  There is progressive thickening of the ligamentum flava contributing to posterior effacement of the CSF surrounding the cord.  There is no cord deformity.  Mild disc bulging is present eccentric to the left, contributing to mild left foraminal stenosis.  C3-C4:  Prior laminectomy and posterolateral fusion.  No spinal stenosis or nerve root encroachment.  C4-C5:  Prior anterior and posterior lateral fusion with posterior decompression. Solid interbody fusion.  No spinal stenosis or nerve root encroachment.  C5-C6:  Solid interbody fusion.  No spinal stenosis or nerve root encroachment.  C6-C7:  Solid interbody fusion.  No spinal stenosis or nerve root encroachment.  C7-T1:  There is progressive adjacent segment disease with annular disc bulging and a  broad-based left foraminal disc protrusion. This moderately narrows the left foramen and may result in left C8 nerve root encroachment.  The right foramen is mildly narrowed. There is no cord deformity.  Mild disc bulging is present within the upper thoracic spine.  IMPRESSION:  1.  Stable postsurgical findings status post fusion from C3-C7 as described. 2.  Increased thickening of the ligamentum flavum at C2-C3 contributing to mild central stenosis without cord flattening. 3.  Progressive adjacent segment disease at C7-T1 with a broad- based left foraminal disc protrusion contributing to left foraminal stenosis and possible left C8 nerve root encroachment. This may contribute to the patient's symptoms.  No cord deformity.   Original Report Authenticated By: Carey Bullocks, M.D.    Mr Lumbar Spine Wo Contrast  02/28/2013  *RADIOLOGY REPORT*  Clinical Data: Low back pain with numbness and burning down left leg.  MRI LUMBAR SPINE WITHOUT CONTRAST  Technique:  Multiplanar and multiecho pulse sequences of the lumbar spine were obtained without intravenous contrast.  Comparison: 07/25/2009.  Findings: Last fully open disc space is labeled L5-S1.  Present examination incorporates from T11-12 disc space through the lower sacrum.  Conus L1 level.  No worrisome osseous lesion.  Mild ectasia of the abdominal aorta.  Visualized paravertebral structures otherwise unremarkable.  T11-12:  Minimal Schmorl's node deformity.  T12-L1:  Negative.  L1-2:  Negative.  L2-3:  Negative.  L3-4:  Facet joint degenerative changes.  Minimal anterior slip L3. Bulge with greater extension to the right.  Mild foraminal narrowing greater on the right.  Slight encroachment upon but not compression of the exiting nerve root.  L4-5:  Prominent facet joint degenerative changes.  Ligamentum flavum hypertrophy.  Mild anterior slip of L4. Bulge.  Mild spinal stenosis.  Mild bilateral lateral recess stenosis slightly greater on the left.  Very mild  bilateral foraminal narrowing without nerve root compression.  L5-S1:  Mild facet joint degenerative changes.  Bulge with superimposed very small focal left posterior lateral disc protrusion with slight impression upon the superior aspect of the left S1 nerve root.  IMPRESSION: Degenerative changes L3-4 through L5-S1 as detailed above appear relatively similar to the prior exam.   Original Report Authenticated By: Lacy Duverney, M.D.    Dg Knee Complete 4 Views Left  02/28/2013  *  RADIOLOGY REPORT*  Clinical Data: Medial pain, no known injury  LEFT KNEE - COMPLETE 4+ VIEW  Comparison: None.  Findings: Four views of the left knee submitted.  No acute fracture or subluxation.  No joint effusion.  IMPRESSION: No acute fracture or subluxation.   Original Report Authenticated By: Natasha Mead, M.D.      1. Neck pain   2. Knee pain, acute, left       MDM  1) Knee pain: Pt complained of knee pain that began this morning after feeling as though her knee locked. Some mild swelling noted on PE without obvious injury or deformity, no ligament laxity appreciated. X-ray negative. Advised to follow RICE protocol and follow up with PCP.   2) Spine and left sided pain:  Pt has history of two cervical fusions w/ worsening numbness, tingling, and pain down left arm for last 5-6 mo. Pt has not followed up with surgeon for this issue. Strength 5/5 w/ good distal pulses and cap refill. MR noted no acute changes in lumbar spine, but showed left foraminal stenosis most likely attributing to left arm pain. Pt given Medrol Pak and advised to follow up with doctor regarding these new symptoms.   Patient agreeable to plan. Patient d/w with Dr. Anitra Lauth, agrees with plan. Patient is stable at time of discharge           Jeannetta Ellis, PA-C 03/01/13 0115

## 2013-02-28 NOTE — ED Notes (Signed)
She states she has had much discomfort in her left arm and leg today, with some weakness of left extremities also.  She tells me she has had multiple back and neck surgeries; and further states she had had three epidural injections with "contaminated" steroids, and is among the people being followed for this issue by the [facility]  She is oriented x 4 with clear speech.

## 2013-02-28 NOTE — ED Notes (Addendum)
Pt reports neck vertebrea are all fuzed, chronic neck pain, pt reports woke up this morning with left sided neck pain,shoulder, chest, back pain, and left knee pain. 7/10 pain. Pt reports left knee swelling. Pt reports left leg weakness.  Pt reports this left sided weakness is new as of this morning. Pt hx of lamenectomy and back surgery.  [facility] had an outbreak of meningitis in 2012 from a contaminated batch of steroids. Pt had shot from batch June and Aug 2012. Pt is treated with itraconizale and gets MRIs every 3 months. CDC is making pt take meds and get MRIs. Last MRI was Dec 2013. Pt unsure if today s/s are related.

## 2013-03-01 NOTE — ED Provider Notes (Signed)
Medical screening examination/treatment/procedure(s) were conducted as a shared visit with non-physician practitioner(s) and myself.  I personally evaluated the patient during the encounter Pt with c/o of new weakness and numbness to the left arm.  Also states knee gave out today and severe pain in the left knee but also feels that maybe left leg is more weak than normal.  MRI of lspine without new pathology and cspine with new foraminal narrowing but no cord compression.  Will give medrol dose pack and f/u with neurologist.  Gwyneth Sprout, MD 03/01/13 1404

## 2013-03-10 ENCOUNTER — Telehealth: Payer: Self-pay | Admitting: Internal Medicine

## 2013-03-10 NOTE — Telephone Encounter (Signed)
Rec'd from [facility] forward 5 pages to Dr.Norins

## 2013-05-11 ENCOUNTER — Other Ambulatory Visit: Payer: Self-pay | Admitting: Internal Medicine

## 2013-05-25 ENCOUNTER — Telehealth: Payer: Self-pay | Admitting: Internal Medicine

## 2013-05-25 NOTE — Telephone Encounter (Signed)
rec'd records from [facility]. Forward 4 pages to Dr.Norins

## 2013-05-26 ENCOUNTER — Ambulatory Visit (INDEPENDENT_AMBULATORY_CARE_PROVIDER_SITE_OTHER): Payer: Medicare Other | Admitting: Obstetrics and Gynecology

## 2013-05-26 ENCOUNTER — Encounter: Payer: Self-pay | Admitting: Obstetrics and Gynecology

## 2013-05-26 VITALS — BP 130/78 | Ht 71.0 in | Wt 159.4 lb

## 2013-05-26 DIAGNOSIS — N83209 Unspecified ovarian cyst, unspecified side: Secondary | ICD-10-CM

## 2013-05-26 NOTE — Progress Notes (Signed)
Patient ID: [name], female   DOB: 11/11/1960, 52 y.o.   MRN: [id number] Pt states here today for abdominal pain and lower back pain. Pt states had ultrasound done at [facility] showed ovarian cyst.     [facility] Visit  Patient name: [name] MRN [id number]  Date of birth: [birth year]  CC & HPI:  [name] is a 52 y.o. female presenting today for followup of intermittent ov cysts. Mild URGE incont sx. ROS:  Hx back probs s/p CONTAMINATED STEROID INJECTIONS INTO BACK RESULTING IN mYCOSES OF SPINAL COLUMN, ON ANTIFUNGALS, AND GETS MRI Q . =+CHRONIC BACK PAIN  Pertinent History Reviewed:  Medical & Surgical Hx:  Reviewed: Significant for HYST dR sMITH Medications: Reviewed & Updated - see associated section Social History: Reviewed -  reports that she has been smoking.  She does not have any smokeless tobacco history on file.  Objective Findings:  Vitals: BP 130/78  Ht 5\' 11"  (1.803 m)  Wt 159 lb 6.4 oz (72.303 kg)  BMI 22.24 kg/m2  Physical Examination: General appearance - alert, well appearing, and in no distress and oriented to person, place, and time Mental status - alert, oriented to person, place, and time, anxious Abdomen - soft, nontender, nondistended, no masses or organomegaly Pelvic - VULVA: normal appearing vulva with no masses, tenderness or lesions, CERVIX: , surgically absent Cuff smooth with mid-cuff thickening , mildly sensitive. No cystocele. No rectocele.    Assessment & Plan:   Hx ov cysts, s/p hysterectomy ? Postmenopausal vs perimenopausal status ?   chk FSH, Pelvic u/s here Fu to disuss u/s and consider bladder antispasmodics. Vesicare

## 2013-05-26 NOTE — Patient Instructions (Signed)
Return for ultrasound and discussion.

## 2013-05-27 LAB — FOLLICLE STIMULATING HORMONE: FSH: 27.7 m[IU]/mL

## 2013-06-01 ENCOUNTER — Ambulatory Visit (INDEPENDENT_AMBULATORY_CARE_PROVIDER_SITE_OTHER): Payer: Medicare Other

## 2013-06-01 DIAGNOSIS — N83209 Unspecified ovarian cyst, unspecified side: Secondary | ICD-10-CM

## 2013-06-27 ENCOUNTER — Telehealth: Payer: Self-pay | Admitting: Obstetrics and Gynecology

## 2013-06-27 NOTE — Telephone Encounter (Signed)
Attempted to call pt to see if any unfinished issues., as chart reported as still open

## 2013-06-27 NOTE — Telephone Encounter (Signed)
Left msg , pt to call office if any unfinished concerns .

## 2013-07-31 IMAGING — CR DG SHOULDER 2+V*L*
3 series · 3 of 3 positions shown · non-contrast
Comparison: 10/07/2004 MR.

CLINICAL DATA: Pain.

LEFT SHOULDER - 2+ VIEW

[w shoulder ap internal left]
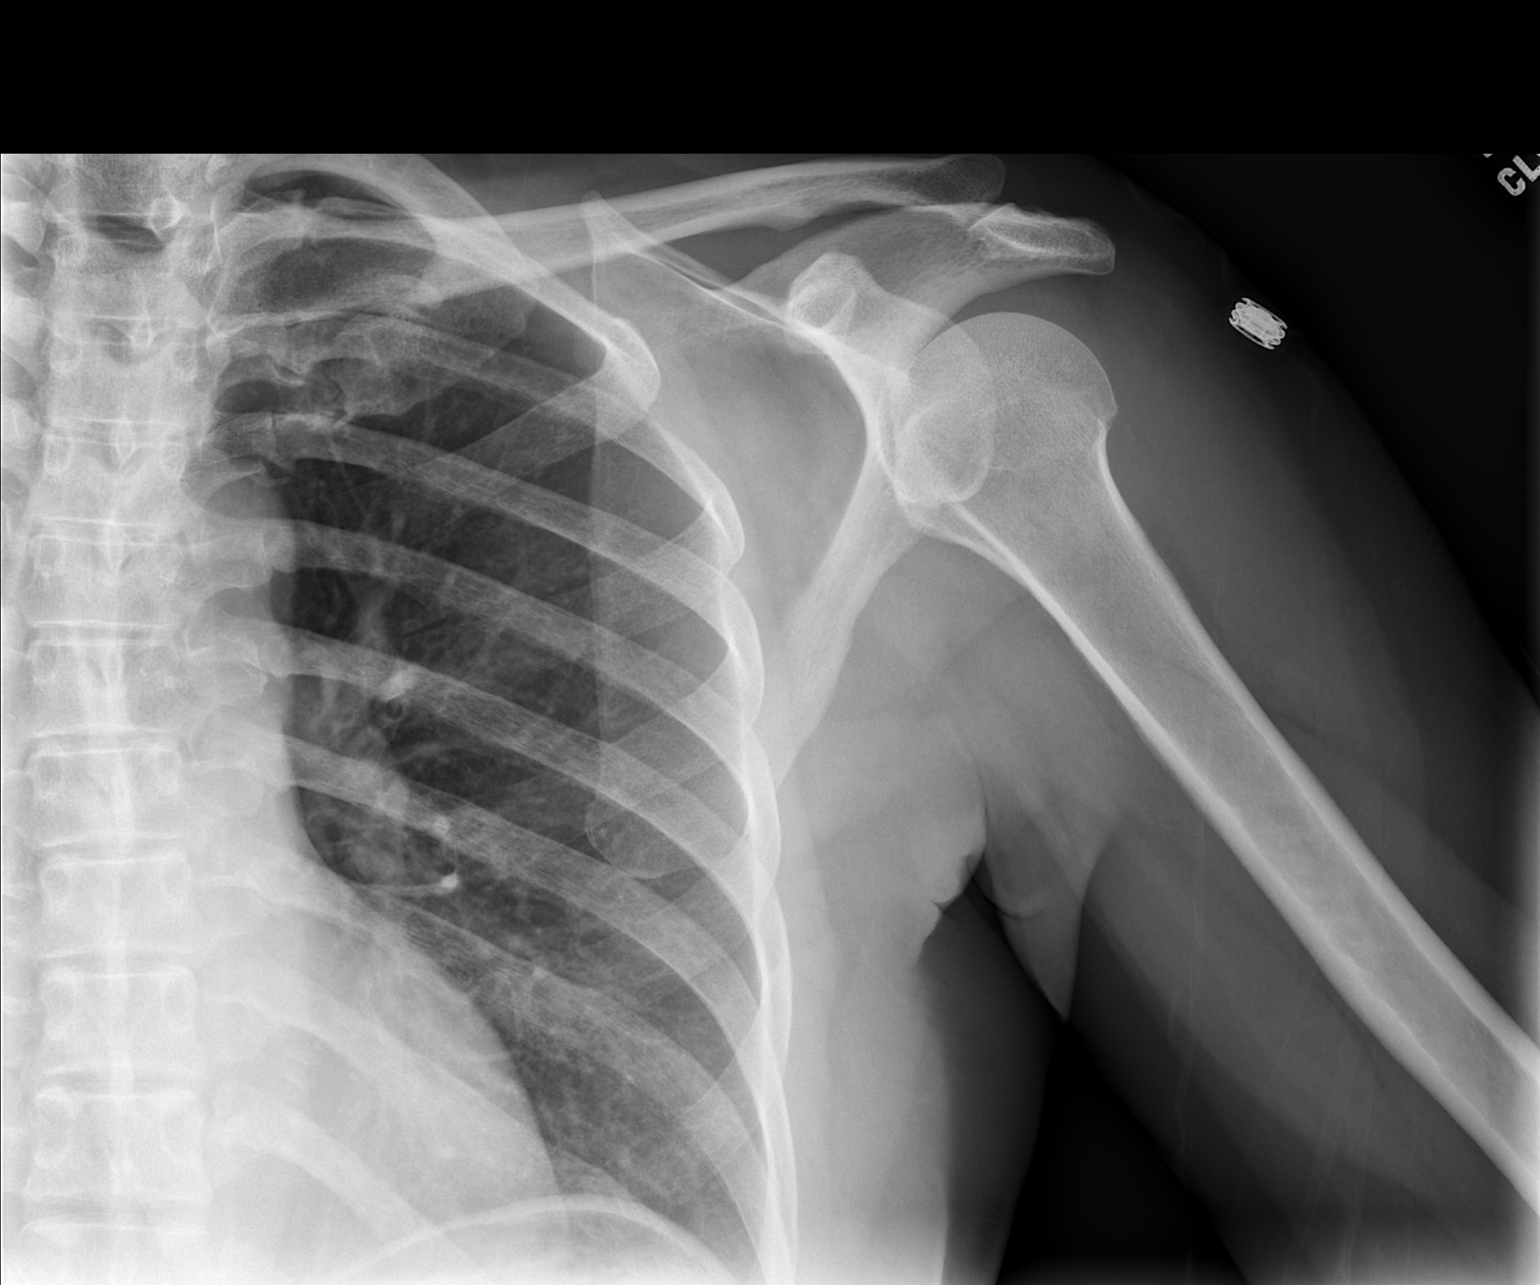

[w shoulder ap external left]
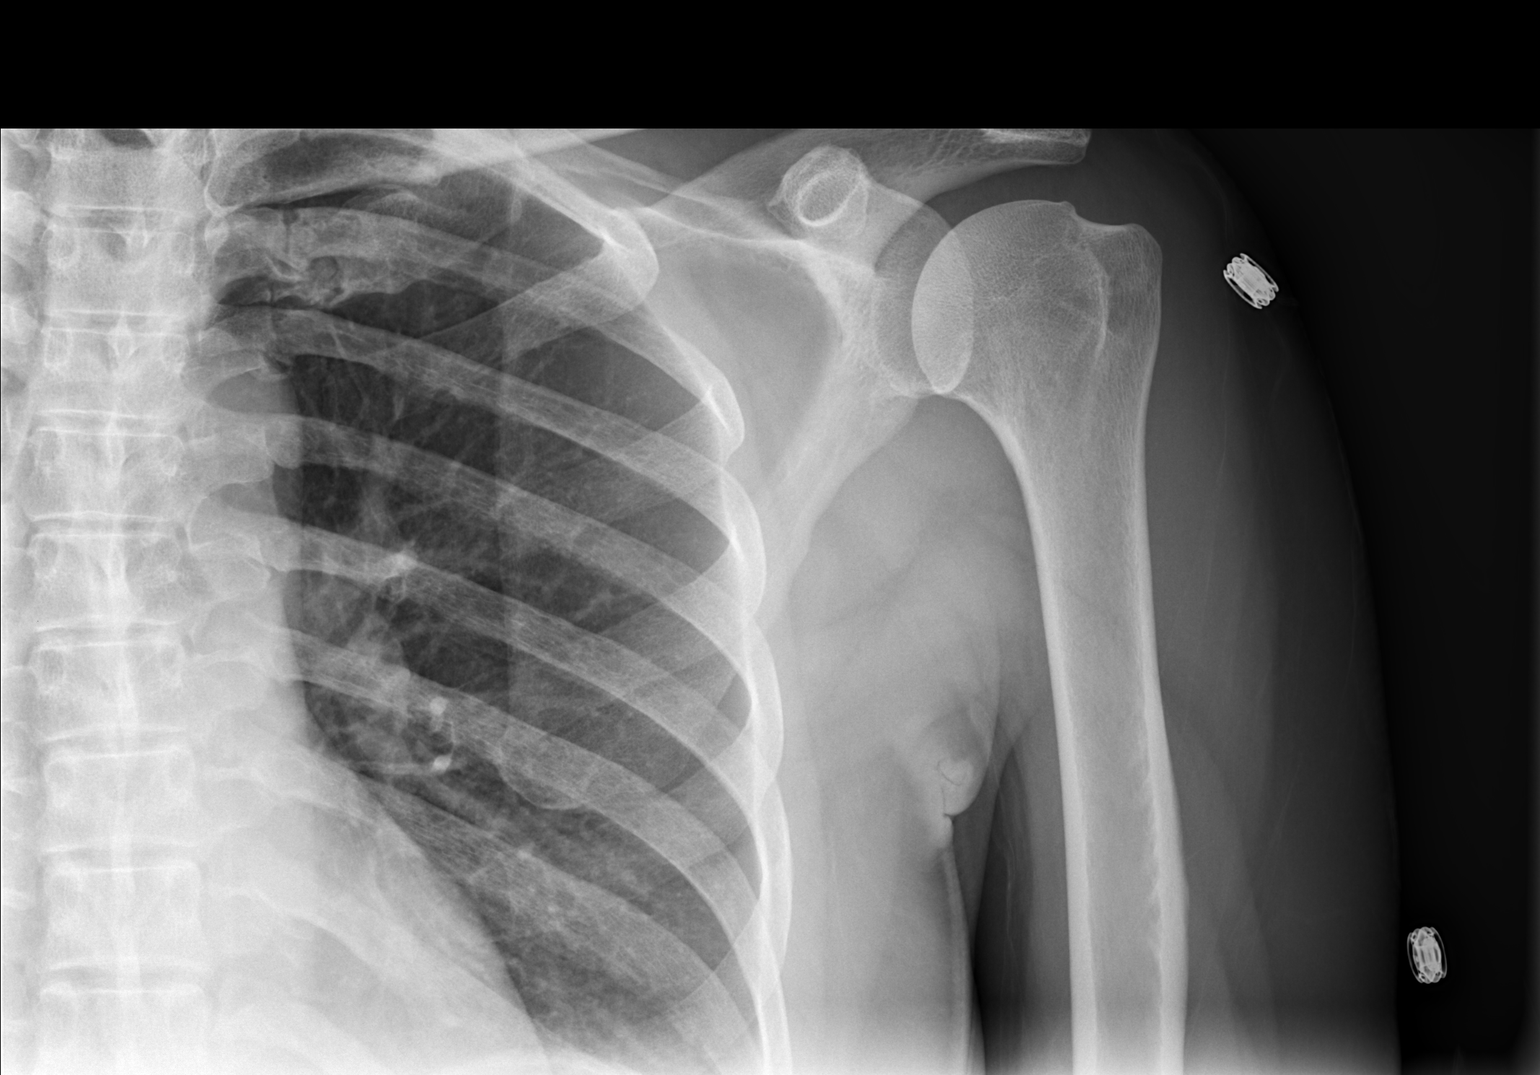

[w shoulder y view left]
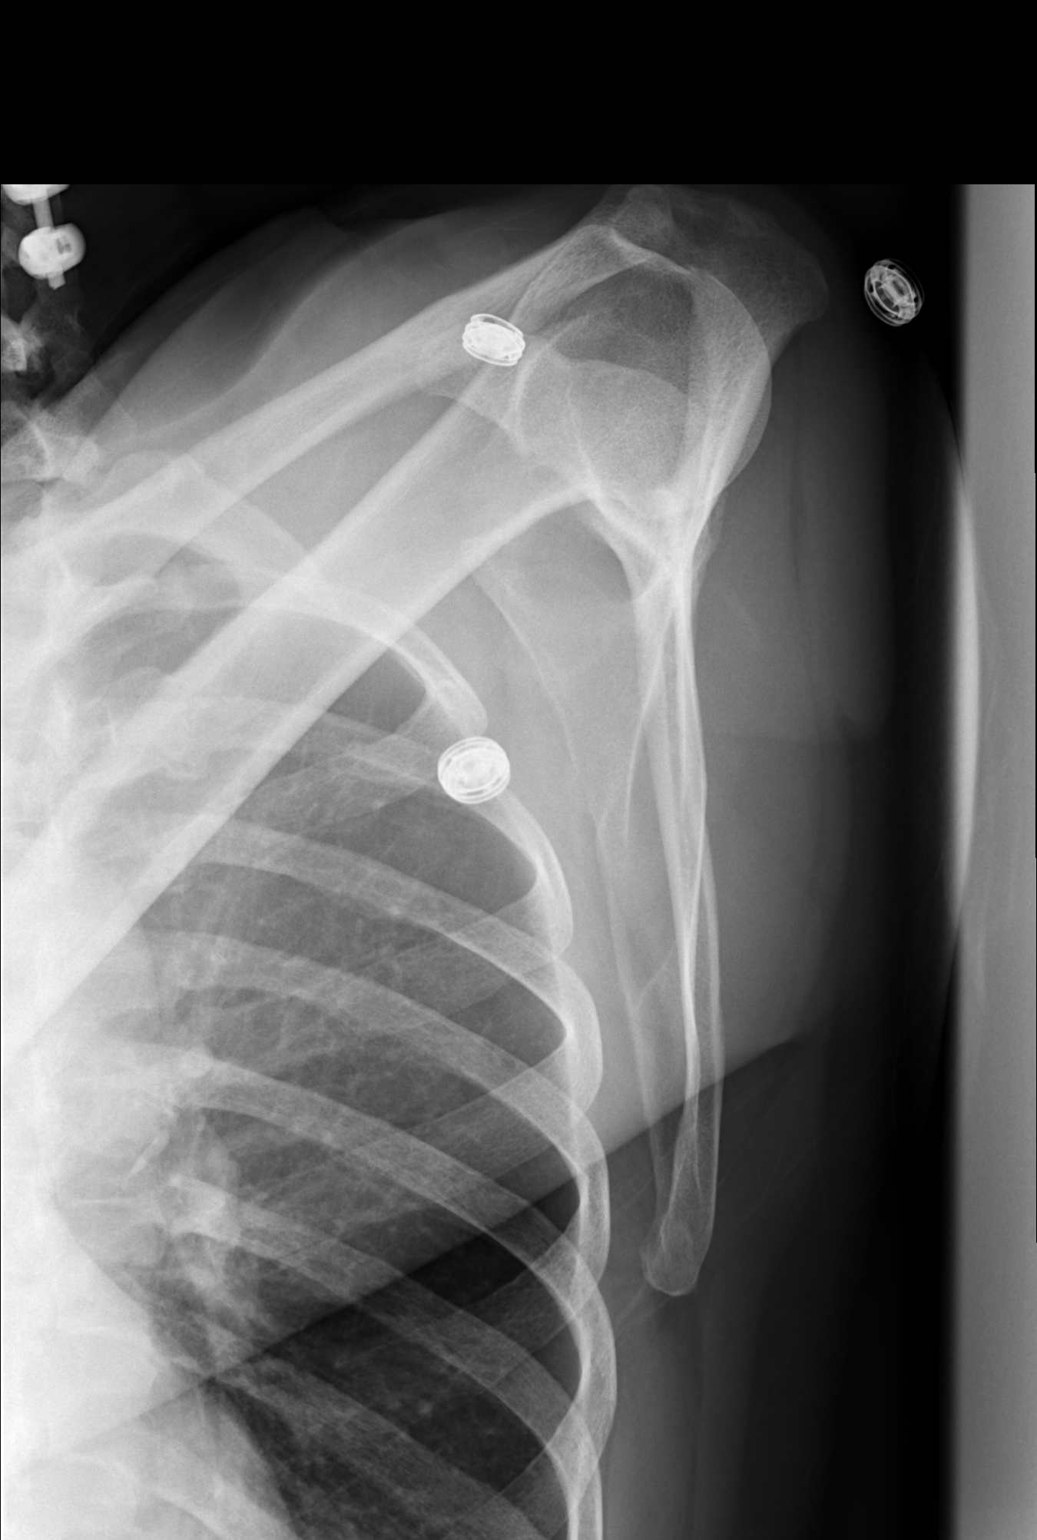

[3 of 3 positions shown; findings below may reference images not displayed]

FINDINGS: Mild dextroscoliosis thoracic spine.  Prior cervical
spine surgery.

Very mild acromioclavicular joint degenerative changes.  No soft
tissue calcifications.  No bony destructive lesion.  Visualized
lungs clear.
IMPRESSION: Very mild acromioclavicular joint degenerative changes

## 2013-08-10 ENCOUNTER — Telehealth: Payer: Self-pay | Admitting: Internal Medicine

## 2013-08-10 NOTE — Telephone Encounter (Signed)
Rec'd from [facility] forward 5 pages to Dr. Debby Bud

## 2013-08-11 ENCOUNTER — Ambulatory Visit (INDEPENDENT_AMBULATORY_CARE_PROVIDER_SITE_OTHER): Payer: Medicare (Managed Care) | Admitting: Internal Medicine

## 2013-08-11 ENCOUNTER — Encounter: Payer: Self-pay | Admitting: Internal Medicine

## 2013-08-11 VITALS — BP 120/82 | HR 87 | Temp 98.2°F | Wt 157.8 lb

## 2013-08-11 DIAGNOSIS — J019 Acute sinusitis, unspecified: Secondary | ICD-10-CM

## 2013-08-11 DIAGNOSIS — F411 Generalized anxiety disorder: Secondary | ICD-10-CM

## 2013-08-11 MED ORDER — AMOXICILLIN 875 MG PO TABS: 875.0000 mg | ORAL_TABLET | Freq: Two times a day (BID) | ORAL | Status: DC

## 2013-08-11 MED ORDER — ALPRAZOLAM 0.5 MG PO TABS: ORAL_TABLET | ORAL | Status: DC

## 2013-08-11 NOTE — Assessment & Plan Note (Signed)
Presents with acute sinusitis today--8 days of sinus tenderness, HA, malaise, myalgias, sore throat, cough productive of yellow sputum, loss of appetite. On exam, sinus tenderness to palpation (especially maxillary), oropharyngeal erythema but no exudates.   Patient was given recommendations to use tylenol for fever and pain, saline washes as needed. She was also given a prescription for amoxicillin 875 mg BID for 7 days to fill if symptoms worsen or fail to improve within 2 days.

## 2013-08-11 NOTE — Progress Notes (Signed)
Subjective:     Patient ID: [name], female   DOB: 11/12/1960, 52 y.o.   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 Since 10/6, she has had eyes running, HA with sinus pressure, congestion, weakness, cough productive of a deep yellow sputum, sore throat, muscle aches. Some chest tightness in the morning which improves after coughing some. No blood in sputum. No subjective fevers, but has been having mild chills and night sweats.  She has had some SOB, which prevented her from sleeping, which was improved by afrin spray. No mucus or eye matting in the mornings. She has been taking some pseudophedrine, which hasn't really helped. She is also using afrin nose spray, which does help. She has a loss of appetite, but no N/V/D.   Her history of fungal meningitis is followed by Dr. Trisha Mangle with [facility], most recently in July of 2014. She stopped taking itraconazole at that time, and was released from care.   Review of Systems General: No weight loss.  CV: No chest pain, no DOE.  Pulm: As per HPI.  Abdominal: As per HPI.  Neuro: Close eyesight is worsening, but is improved by reading glasses. Sees eye doctor, last seen 4-5 months ago. Dizziness provoked by motion.  Psych: Taking alprazolam 0.5 mg infrequently when she feels "worked up" like when at a funeral or when her bills are worrying her.     Objective:   Physical Exam Filed Vitals:   08/11/13 1417  BP: 120/82  Pulse: 87  Temp: 98.2 F (36.8 C)   General: Ill appearing woman sitting in the exam room in NAD.  CV: RRR Pulm: CTAB HEENT: TMs clear bilaterally. Frontal and maxillary sinus tenderness to palpation. Nasal turbinates are erythematous. Oropharynx is erythematous, but no tonsillar exudates. Conjunctiva and sclera clear.   Current  Outpatient Prescriptions on File Prior to Visit  Medication Sig Dispense Refill  . ALPRAZolam (XANAX) 0.5 MG tablet take 1 tablet by mouth at bedtime if needed  30 tablet  5  . cyclobenzaprine (FLEXERIL) 5 MG tablet Take 5 mg by mouth 2 (two) times daily as needed.       Marland Kitchen HYDROcodone-acetaminophen (NORCO) 7.5-325 MG per tablet Take 1 tablet by mouth every 6 (six) hours as needed for pain.      . methylPREDNISolone (MEDROL, PAK,) 4 MG tablet follow package directions  21 tablet  0  . ranitidine (ZANTAC) 150 MG tablet take 1 tablet by mouth twice a day  60 tablet  11  . itraconazole (SPORANOX) 100 MG capsule Take 200 mg by mouth 2 (two) times daily.       No current facility-administered medications on file prior to visit.      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     Plan:     See plan by problem list.

## 2013-08-11 NOTE — Assessment & Plan Note (Signed)
Patient states that she takes the alprazolam 0.5 mg as needed, less than once a week.   Will refill alprazolam today.

## 2013-08-11 NOTE — Patient Instructions (Addendum)
Sinusitis - it appears you have a sinus infection - question of viral vs bacterial  Plan  Supportive care: nasal saline, tylenol 1,000 mg three times a day as needed for fever and aches, hydrate, Vit C 1500 mg daily  Rx for amoxicillin if your symptoms persist beyond a couple more days or if you have fever spikes or increased facial   Sinusitis Sinusitis is redness, soreness, and swelling (inflammation) of the paranasal sinuses. Paranasal sinuses are air pockets within the bones of your face (beneath the eyes, the middle of the forehead, or above the eyes). In healthy paranasal sinuses, mucus is able to drain out, and air is able to circulate through them by way of your nose. However, when your paranasal sinuses are inflamed, mucus and air can become trapped. This can allow bacteria and other germs to grow and cause infection. Sinusitis can develop quickly and last only a short time (acute) or continue over a long period (chronic). Sinusitis that lasts for more than 12 weeks is considered chronic.  CAUSES  Causes of sinusitis include:  Allergies.  Structural abnormalities, such as displacement of the cartilage that separates your nostrils (deviated septum), which can decrease the air flow through your nose and sinuses and affect sinus drainage.  Functional abnormalities, such as when the small hairs (cilia) that line your sinuses and help remove mucus do not work properly or are not present. SYMPTOMS  Symptoms of acute and chronic sinusitis are the same. The primary symptoms are pain and pressure around the affected sinuses. Other symptoms include:  Upper toothache.  Earache.  Headache.  Bad breath.  Decreased sense of smell and taste.  A cough, which worsens when you are lying flat.  Fatigue.  Fever.  Thick drainage from your nose, which often is green and may contain pus (purulent).  Swelling and warmth over the affected sinuses. DIAGNOSIS  Your caregiver will perform a  physical exam. During the exam, your caregiver may:  Look in your nose for signs of abnormal growths in your nostrils (nasal polyps).  Tap over the affected sinus to check for signs of infection.  View the inside of your sinuses (endoscopy) with a special imaging device with a light attached (endoscope), which is inserted into your sinuses. If your caregiver suspects that you have chronic sinusitis, one or more of the following tests may be recommended:  Allergy tests.  Nasal culture A sample of mucus is taken from your nose and sent to a lab and screened for bacteria.  Nasal cytology A sample of mucus is taken from your nose and examined by your caregiver to determine if your sinusitis is related to an allergy. TREATMENT  Most cases of acute sinusitis are related to a viral infection and will resolve on their own within 10 days. Sometimes medicines are prescribed to help relieve symptoms (pain medicine, decongestants, nasal steroid sprays, or saline sprays).  However, for sinusitis related to a bacterial infection, your caregiver will prescribe antibiotic medicines. These are medicines that will help kill the bacteria causing the infection.  Rarely, sinusitis is caused by a fungal infection. In theses cases, your caregiver will prescribe antifungal medicine. For some cases of chronic sinusitis, surgery is needed. Generally, these are cases in which sinusitis recurs more than 3 times per year, despite other treatments. HOME CARE INSTRUCTIONS   Drink plenty of water. Water helps thin the mucus so your sinuses can drain more easily.  Use a humidifier.  Inhale steam 3 to 4 times  a day (for example, sit in the bathroom with the shower running).  Apply a warm, moist washcloth to your face 3 to 4 times a day, or as directed by your caregiver.  Use saline nasal sprays to help moisten and clean your sinuses.  Take over-the-counter or prescription medicines for pain, discomfort, or fever only  as directed by your caregiver. SEEK IMMEDIATE MEDICAL CARE IF:  You have increasing pain or severe headaches.  You have nausea, vomiting, or drowsiness.  You have swelling around your face.  You have vision problems.  You have a stiff neck.  You have difficulty breathing. MAKE SURE YOU:   Understand these instructions.  Will watch your condition.  Will get help right away if you are not doing well or get worse. Document Released: 10/15/2005 Document Revised: 01/07/2012 Document Reviewed: 10/30/2011 [facility] Patient Information 2014 [place], [place].

## 2013-09-29 ENCOUNTER — Other Ambulatory Visit: Payer: Self-pay | Admitting: Neurosurgery

## 2013-09-29 DIAGNOSIS — M545 Low back pain, unspecified: Secondary | ICD-10-CM

## 2013-09-29 DIAGNOSIS — IMO0002 Reserved for concepts with insufficient information to code with codable children: Secondary | ICD-10-CM

## 2013-10-06 ENCOUNTER — Other Ambulatory Visit: Payer: Self-pay

## 2013-10-07 ENCOUNTER — Telehealth: Payer: Self-pay | Admitting: *Deleted

## 2013-10-08 ENCOUNTER — Other Ambulatory Visit: Payer: Self-pay

## 2013-10-08 DIAGNOSIS — Z1231 Encounter for screening mammogram for malignant neoplasm of breast: Secondary | ICD-10-CM

## 2013-10-09 ENCOUNTER — Ambulatory Visit
Admission: RE | Admit: 2013-10-09 | Discharge: 2013-10-09 | Disposition: A | Discharge status: Home / Self Care | Payer: Medicare Other | Source: Ambulatory Visit | Attending: Neurosurgery | Admitting: Neurosurgery

## 2013-10-09 DIAGNOSIS — IMO0002 Reserved for concepts with insufficient information to code with codable children: Secondary | ICD-10-CM

## 2013-10-09 DIAGNOSIS — M545 Low back pain, unspecified: Secondary | ICD-10-CM

## 2013-10-09 MED ORDER — GADOBENATE DIMEGLUMINE 529 MG/ML IV SOLN
14.0000 mL | Freq: Once | INTRAVENOUS | Status: AC | PRN
  Administered 2013-10-09: 14 mL via INTRAVENOUS

## 2013-11-11 ENCOUNTER — Ambulatory Visit: Payer: Medicare (Managed Care)

## 2013-11-19 NOTE — Telephone Encounter (Signed)
Multiple messages left.

## 2013-11-30 ENCOUNTER — Ambulatory Visit
Admission: RE | Admit: 2013-11-30 | Discharge: 2013-11-30 | Disposition: A | Discharge status: Home / Self Care | Payer: Medicare Other | Source: Ambulatory Visit

## 2013-11-30 DIAGNOSIS — Z1231 Encounter for screening mammogram for malignant neoplasm of breast: Secondary | ICD-10-CM

## 2014-03-04 ENCOUNTER — Encounter: Payer: Self-pay | Admitting: Gastroenterology

## 2014-03-11 ENCOUNTER — Telehealth: Payer: Self-pay

## 2014-03-11 NOTE — Telephone Encounter (Signed)
Patient is a [facility] patient. Last filled 08/11/13 #30 with 5 refills. Pending apt 05/14/14  Please advise    KP

## 2014-03-12 MED ORDER — ALPRAZOLAM 0.5 MG PO TABS: ORAL_TABLET | ORAL | Status: DC

## 2014-03-12 NOTE — Telephone Encounter (Signed)
Rx printed and awaiting signature from provider.   Called patient and left message for call back.

## 2014-03-12 NOTE — Telephone Encounter (Signed)
Refill x1 

## 2014-03-24 ENCOUNTER — Telehealth: Payer: Self-pay | Admitting: Family Medicine

## 2014-03-24 ENCOUNTER — Other Ambulatory Visit: Payer: Self-pay | Admitting: Family Medicine

## 2014-03-24 NOTE — Telephone Encounter (Signed)
Caller name: Geroldine Relation to pt: self  Call back number:[phone] Pharmacy:[facility], [address]   Reason for call:   Pt states that pharmacy does not have the RX Xanax and wants to know if we can resend the RX to them.  Please call pt when completed.

## 2014-03-24 NOTE — Telephone Encounter (Signed)
Spoke with pharmacist and she stated that their fax machine was down for 2 weeks so they did not get the prescription that was sent on the 15 th, I called it in, tried calling the patient but a child answered the phone with a lot of background noise and said the patient was gone and h/u. I tried call back both numbers but no answer on either line. Rx called in but the patient has not been notified.      KP

## 2014-04-20 ENCOUNTER — Ambulatory Visit (AMBULATORY_SURGERY_CENTER): Payer: Self-pay

## 2014-04-20 VITALS — Ht 68.5 in | Wt 162.0 lb

## 2014-04-20 DIAGNOSIS — Z1211 Encounter for screening for malignant neoplasm of colon: Secondary | ICD-10-CM

## 2014-04-20 MED ORDER — MOVIPREP 100 G PO SOLR: ORAL | Status: DC

## 2014-04-20 NOTE — Progress Notes (Signed)
Per pt, no allergies to soy or egg products.Pt not taking any weight loss meds or using  O2 at home. 

## 2014-04-28 ENCOUNTER — Encounter: Payer: Self-pay | Admitting: Gastroenterology

## 2014-05-04 ENCOUNTER — Encounter: Payer: Self-pay | Admitting: Gastroenterology

## 2014-05-04 ENCOUNTER — Ambulatory Visit (AMBULATORY_SURGERY_CENTER): Payer: Medicare Other | Admitting: Gastroenterology

## 2014-05-04 VITALS — BP 132/87 | HR 63 | Temp 96.1°F | Resp 16 | Ht 68.5 in | Wt 162.0 lb

## 2014-05-04 DIAGNOSIS — D126 Benign neoplasm of colon, unspecified: Secondary | ICD-10-CM

## 2014-05-04 DIAGNOSIS — Z1211 Encounter for screening for malignant neoplasm of colon: Secondary | ICD-10-CM

## 2014-05-04 MED ORDER — SODIUM CHLORIDE 0.9 % IV SOLN: 500.0000 mL | INTRAVENOUS | Status: DC

## 2014-05-04 NOTE — Patient Instructions (Signed)
YOU HAD AN ENDOSCOPIC PROCEDURE TODAY AT THE [facility]: Refer to the procedure report that was given to you for any specific questions about what was found during the examination.  If the procedure report does not answer your questions, please call your gastroenterologist to clarify.  If you requested that your care partner not be given the details of your procedure findings, then the procedure report has been included in a sealed envelope for you to review at your convenience later.  YOU SHOULD EXPECT: Some feelings of bloating in the abdomen. Passage of more gas than usual.  Walking can help get rid of the air that was put into your GI tract during the procedure and reduce the bloating. If you had a lower endoscopy (such as a colonoscopy or flexible sigmoidoscopy) you may notice spotting of blood in your stool or on the toilet paper. If you underwent a bowel prep for your procedure, then you may not have a normal bowel movement for a few days.  DIET: Your first meal following the procedure should be a light meal and then it is ok to progress to your normal diet.  A half-sandwich or bowl of soup is an example of a good first meal.  Heavy or fried foods are harder to digest and may make you feel nauseous or bloated.  Likewise meals heavy in dairy and vegetables can cause extra gas to form and this can also increase the bloating.  Drink plenty of fluids but you should avoid alcoholic beverages for 24 hours.  ACTIVITY: Your care partner should take you home directly after the procedure.  You should plan to take it easy, moving slowly for the rest of the day.  You can resume normal activity the day after the procedure however you should NOT DRIVE or use heavy machinery for 24 hours (because of the sedation medicines used during the test).    SYMPTOMS TO REPORT IMMEDIATELY: A gastroenterologist can be reached at any hour.  During normal business hours, 8:30 AM to 5:00 PM Monday through Friday,  call [phone].  After hours and on weekends, please call the GI answering service at [phone] who will take a message and have the physician on call contact you.   Following lower endoscopy (colonoscopy or flexible sigmoidoscopy):  Excessive amounts of blood in the stool  Significant tenderness or worsening of abdominal pains  Swelling of the abdomen that is new, acute  Fever of 100F or higher   FOLLOW UP: If any biopsies were taken you will be contacted by phone or by letter within the next 1-3 weeks.  Call your gastroenterologist if you have not heard about the biopsies in 3 weeks.  Our staff will call the home number listed on your records the next business day following your procedure to check on you and address any questions or concerns that you may have at that time regarding the information given to you following your procedure. This is a courtesy call and so if there is no answer at the home number and we have not heard from you through the emergency physician on call, we will assume that you have returned to your regular daily activities without incident.  SIGNATURES/CONFIDENTIALITY: You and/or your care partner have signed paperwork which will be entered into your electronic medical record.  These signatures attest to the fact that that the information above on your After Visit Summary has been reviewed and is understood.  Full responsibility of the confidentiality of   this discharge information lies with you and/or your care-partner.    INFORMATION ON POLYPS,DIVERTICULOSIS,HEMORRHOIDS AND HIGH FIBER DIET GIVEN TO YOU TODAY  MIRALAX DAILY OR TWICE DAILY FOR CONSTIPATION AND BLOATING

## 2014-05-04 NOTE — Progress Notes (Signed)
Patient awake and alert, vss, report to rn 

## 2014-05-04 NOTE — Op Note (Signed)
[place]  [address] [place], [id number]   COLONOSCOPY PROCEDURE REPORT  PATIENT: Laura, [name]  MR#: [id number] BIRTHDATE: [birth year] , 39  yrs. old GENDER: Female ENDOSCOPIST: Ladene Artist, MD, [facility] REFERRED FV:[id number] [place], DO PROCEDURE DATE:  05/04/2014 PROCEDURE:   Colonoscopy with snare polypectomy First Screening Colonoscopy - Avg.  risk and is 50 yrs.  old or older Yes.  Prior Negative Screening - Now for repeat screening. N/A  History of Adenoma - Now for follow-up colonoscopy & has been > or = to 3 yrs.  N/A  Polyps Removed Today? Yes. ASA CLASS:   Class II INDICATIONS:average risk screening. MEDICATIONS: MAC sedation, administered by CRNA and propofol (Diprivan) 250mg  IV DESCRIPTION OF PROCEDURE:   After the risks benefits and alternatives of the procedure were thoroughly explained, informed consent was obtained.  A digital rectal exam revealed no abnormalities of the rectum.   The LB [id number]  endoscope was introduced through the anus and advanced to the cecum, which was identified by both the appendix and ileocecal valve. No adverse events experienced.   The quality of the prep was good, using MoviPrep  The instrument was then slowly withdrawn as the colon was fully examined.  COLON FINDINGS: A sessile polyp measuring 1 cm in size was found at the cecum.  A polypectomy was performed using snare cautery.  The resection was complete and the polyp tissue was completely retrieved.   Moderate diverticulosis was noted in the descending colon and sigmoid colon.   The colon was otherwise normal.  There was no diverticulosis, inflammation, polyps or cancers unless previously stated.  Retroflexed views revealed small internal hemorrhoids. The time to cecum=2 minutes 43 seconds.  Withdrawal time=10 minutes 24 seconds.  The scope was withdrawn and the procedure completed. COMPLICATIONS: There were no complications.  ENDOSCOPIC  IMPRESSION: 1.   Sessile polyp measuring 1 cm at the cecum; polypectomy performed using snare cautery 2.   Moderate diverticulosis in the descending colon and sigmoid colon 3.   Small internal hemorrhoids  RECOMMENDATIONS: 1.  Hold aspirin, aspirin products, and anti-inflammatory medication for 2 weeks. 2.  Await pathology results 3.  Repeat colonoscopy in 3 years if polyp adenomatous; otherwise 10 years 4.  High fiber diet with liberal fluid intake. 5.  Miralax daily or twice daily as needed for constipation and bloating  eSigned:  Ladene Artist, MD, [facility] 05/04/2014 9:36 AM

## 2014-05-04 NOTE — Progress Notes (Signed)
Called to room to assist during endoscopic procedure.  Patient ID and intended procedure confirmed with present staff. Received instructions for my participation in the procedure from the performing physician.  

## 2014-05-05 ENCOUNTER — Telehealth: Payer: Self-pay | Admitting: *Deleted

## 2014-05-05 NOTE — Telephone Encounter (Signed)
  Follow up Call-  Call back number 05/04/2014  Post procedure Call Back phone  # [phone]  Permission to leave phone message Yes     Patient questions:  Do you have a fever, pain , or abdominal swelling? No. Pain Score  0 *  Have you tolerated food without any problems? Yes.    Have you been able to return to your normal activities? Yes.    Do you have any questions about your discharge instructions: Diet   No. Medications  No. Follow up visit  No.  Do you have questions or concerns about your Care? No.  Actions: * If pain score is 4 or above: No action needed, pain <4.

## 2014-05-10 ENCOUNTER — Encounter: Payer: Self-pay | Admitting: Gastroenterology

## 2014-05-14 ENCOUNTER — Ambulatory Visit: Payer: Medicare Other | Admitting: Family Medicine

## 2014-05-19 ENCOUNTER — Ambulatory Visit: Payer: Medicare Other | Admitting: Family Medicine

## 2014-07-28 ENCOUNTER — Telehealth: Payer: Self-pay

## 2014-07-28 NOTE — Telephone Encounter (Signed)
Spoke with pt. They will call back and schedule appt with new care provider, Dr. Doug Sou

## 2014-08-13 ENCOUNTER — Other Ambulatory Visit: Payer: Self-pay | Admitting: Family Medicine

## 2014-08-16 NOTE — Telephone Encounter (Signed)
UDS: none on file Former [facility] patient and has not yet established with Dr Etter Sjogren Last Refill by Dr Etter Sjogren: 03/12/2014 #30 with no refills

## 2014-08-18 NOTE — Telephone Encounter (Signed)
Unable to RF

## 2014-08-18 NOTE — Telephone Encounter (Signed)
Pt is requesting refill on Alprazolam. Dr. Linda Hedges Pt, she had appt with Dr. Etter Sjogren scheduled for 7/17 and 7/22 for NP, which both were cancelled.  Last OV: 08/11/2013  Last Fill: 03/12/2014 UDS: None  Please advise.

## 2014-08-20 ENCOUNTER — Ambulatory Visit (INDEPENDENT_AMBULATORY_CARE_PROVIDER_SITE_OTHER): Payer: Medicare Other | Admitting: Family

## 2014-08-20 ENCOUNTER — Encounter: Payer: Self-pay | Admitting: Family

## 2014-08-20 VITALS — BP 118/88 | HR 58 | Temp 98.0°F | Resp 16 | Ht 66.0 in | Wt 168.0 lb

## 2014-08-20 DIAGNOSIS — Z23 Encounter for immunization: Secondary | ICD-10-CM

## 2014-08-20 DIAGNOSIS — N760 Acute vaginitis: Secondary | ICD-10-CM

## 2014-08-20 DIAGNOSIS — B375 Candidal meningitis: Secondary | ICD-10-CM

## 2014-08-20 DIAGNOSIS — Z720 Tobacco use: Secondary | ICD-10-CM

## 2014-08-20 DIAGNOSIS — N898 Other specified noninflammatory disorders of vagina: Secondary | ICD-10-CM

## 2014-08-20 DIAGNOSIS — F411 Generalized anxiety disorder: Secondary | ICD-10-CM

## 2014-08-20 MED ORDER — FLUCONAZOLE 150 MG PO TABS: ORAL_TABLET | ORAL | Status: DC

## 2014-08-20 MED ORDER — ALPRAZOLAM 0.5 MG PO TABS: ORAL_TABLET | ORAL | Status: DC

## 2014-08-20 NOTE — Progress Notes (Signed)
Pre visit review using our clinic review tool, if applicable. No additional management support is needed unless otherwise documented below in the visit note. 

## 2014-08-20 NOTE — Patient Instructions (Signed)
Please return fasting for complete physical at your convenience. Welcome to [facility]!

## 2014-08-20 NOTE — Assessment & Plan Note (Signed)
Stable with occasional prn xanax.   Will refill today, send for urine drug tox. Controlled substance agreement for xanax signed today. Narcotic is filled by pain management.

## 2014-08-20 NOTE — Assessment & Plan Note (Signed)
Clinically stable.  Completed long term therapy. Followed by Dr. Vanessa [place] at [facility]

## 2014-08-20 NOTE — Assessment & Plan Note (Signed)
Wet prep performed. Suspect yeast, start diflucan.

## 2014-08-20 NOTE — Assessment & Plan Note (Signed)
   Discussed smoking cessation. 

## 2014-08-20 NOTE — Progress Notes (Signed)
Subjective:    Patient ID: [name], female    DOB: 1960-12-31, 53 y.o.   MRN: [id number]  HPI  [name] is a 53 yr old female who presents today to establish care. She was previously followed by Dr. Linda Hedges who has retired.   Reports + vaginal discharge and itching.  Has been eating yogurt but no other otc measures.   History of fungal meningitis following contaminated ESI.  She was maintained on antifungals for >1 year and is now followed by pain management. Her ID physician is Dr. Vanessa [place].  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   Anxiety- maintained on alprazolam prn. Some panic attacks. Reports that she has been on zoloft in the past Last dose xanax last night.    Reports that she started smoking again.     Review of Systems    see hpi  Past Medical History  Diagnosis Date  . PVC's (premature ventricular contractions)   . GERD (gastroesophageal reflux disease)   . Chest pain   . RVOT ventricular tachycardia   . DJD (degenerative joint disease)   . Depression   . Migraine headache   . History of palpitations     History   Social History  . Marital Status: Married    Spouse Name: N/A    Number of Children: N/A  . Years of Education: N/A   Occupational History  . Not on file.   Social History Main Topics  . Smoking status: Current Every Day Smoker -- 1.00 packs/day    Types: Cigarettes  . Smokeless tobacco: Never Used  . Alcohol Use: No  . Drug Use: No  . Sexual Activity: Yes   Other Topics Concern  . Not on file   Social History Narrative   Occupation: retired [employer]   Married   Alcohol no   Drug use no, no caffiene   Tobacco use- yes quit Dec '08   Regular Exercise no   4 children; reports increased stress in due to caring for grandchildren   UNEMPLOYED    Past Surgical History  Procedure Laterality Date  . Tubal ligation    . Laminectomy    . Total abdominal hysterectomy    . Cholecystectomy      . Back surgery  10/23/12  . Mri      every 3 mths /due to menigitis    Family History  Problem Relation Age of Onset  . Diabetes Mother     and multiplesclerosis  . Heart disease Father   . Hypertension Sister   . Kidney disease Sister     No Known Allergies  Current Outpatient Prescriptions on File Prior to Visit  Medication Sig Dispense Refill  . cyclobenzaprine (FLEXERIL) 5 MG tablet Take 5 mg by mouth 2 (two) times daily as needed.       Marland Kitchen HYDROcodone-acetaminophen (NORCO) 7.5-325 MG per tablet Take 1 tablet by mouth every 6 (six) hours as needed for pain.      . ranitidine (ZANTAC) 150 MG tablet take 1 tablet by mouth twice a day  60 tablet  11   No current facility-administered medications on file prior to visit.    BP 118/88  Pulse 58  Temp(Src) 98 F (36.7 C) (Oral)  Resp 16  Ht 5\' 6"  (1.676 m)  Wt 168 lb (76.204 kg)  BMI 27.13 kg/m2  SpO2 99%    Objective:   Physical Exam  Constitutional: She is oriented to person,  place, and time. She appears well-developed and well-nourished. No distress.  HENT:  Head: Normocephalic.  Cardiovascular: Normal rate and regular rhythm.   No murmur heard. Pulmonary/Chest: Effort normal and breath sounds normal. No respiratory distress. She has no wheezes. She has no rales. She exhibits no tenderness.  Genitourinary:  Some thick white discharge noted at introitus  Musculoskeletal: She exhibits no edema.  Neurological: She is alert and oriented to person, place, and time.  Psychiatric: She has a normal mood and affect. Her behavior is normal. Judgment and thought content normal.          Assessment & Plan:

## 2014-08-21 LAB — WET PREP BY MOLECULAR PROBE
Candida species: POSITIVE — AB
Gardnerella vaginalis: NEGATIVE
Trichomonas vaginosis: NEGATIVE

## 2014-08-23 ENCOUNTER — Telehealth: Payer: Self-pay | Admitting: Family

## 2014-08-23 NOTE — Telephone Encounter (Signed)
emmi mailed  °

## 2014-08-26 ENCOUNTER — Encounter: Payer: Medicare Other | Admitting: Family

## 2014-09-09 ENCOUNTER — Encounter: Payer: Self-pay | Admitting: Family

## 2014-09-09 ENCOUNTER — Ambulatory Visit (INDEPENDENT_AMBULATORY_CARE_PROVIDER_SITE_OTHER): Payer: Medicare Other | Admitting: Family

## 2014-09-09 VITALS — BP 119/82 | HR 75 | Temp 98.3°F | Resp 16 | Ht 66.0 in | Wt 166.6 lb

## 2014-09-09 DIAGNOSIS — N760 Acute vaginitis: Secondary | ICD-10-CM

## 2014-09-09 DIAGNOSIS — F411 Generalized anxiety disorder: Secondary | ICD-10-CM | POA: Diagnosis not present

## 2014-09-09 DIAGNOSIS — Z0000 Encounter for general adult medical examination without abnormal findings: Secondary | ICD-10-CM | POA: Insufficient documentation

## 2014-09-09 DIAGNOSIS — Z720 Tobacco use: Secondary | ICD-10-CM

## 2014-09-09 DIAGNOSIS — Z23 Encounter for immunization: Secondary | ICD-10-CM

## 2014-09-09 LAB — BASIC METABOLIC PANEL
BUN: 7 mg/dL (ref 6–23)
CO2: 22 mEq/L (ref 19–32)
Calcium: 9.1 mg/dL (ref 8.4–10.5)
Chloride: 104 mEq/L (ref 96–112)
Creatinine, Ser: 0.6 mg/dL (ref 0.4–1.2)
GFR: 126.92 mL/min (ref >60.00)
Glucose, Bld: 86 mg/dL (ref 70–99)
Potassium: 4 mEq/L (ref 3.5–5.1)
Sodium: 135 mEq/L (ref 135–145)

## 2014-09-09 LAB — URINALYSIS, ROUTINE W REFLEX MICROSCOPIC
Bilirubin Urine: NEGATIVE
Hgb urine dipstick: NEGATIVE
Ketones, ur: NEGATIVE
Leukocytes, UA: NEGATIVE
Nitrite: NEGATIVE
Specific Gravity, Urine: <=1.005 — AB (ref 1.000–1.030)
Total Protein, Urine: NEGATIVE
Urine Glucose: NEGATIVE
Urobilinogen, UA: 0.2 (ref 0.0–1.0)
pH: 6 (ref 5.0–8.0)

## 2014-09-09 LAB — HEPATIC FUNCTION PANEL
ALT: 14 U/L (ref 0–35)
AST: 14 U/L (ref 0–37)
Albumin: 3.4 g/dL — ABNORMAL LOW (ref 3.5–5.2)
Alkaline Phosphatase: 64 U/L (ref 39–117)
Bilirubin, Direct: 0.1 mg/dL (ref 0.0–0.3)
Total Bilirubin: 0.8 mg/dL (ref 0.2–1.2)
Total Protein: 7.8 g/dL (ref 6.0–8.3)

## 2014-09-09 LAB — CBC WITH DIFFERENTIAL/PLATELET
Basophils Absolute: 0 10*3/uL (ref 0.0–0.1)
Basophils Relative: 0.4 % (ref 0.0–3.0)
Eosinophils Absolute: 0 10*3/uL (ref 0.0–0.7)
Eosinophils Relative: 0.5 % (ref 0.0–5.0)
HCT: 42.3 % (ref 36.0–46.0)
Hemoglobin: 13.6 g/dL (ref 12.0–15.0)
Lymphocytes Relative: 43.4 % (ref 12.0–46.0)
Lymphs Abs: 3 10*3/uL (ref 0.7–4.0)
MCHC: 32 g/dL (ref 30.0–36.0)
MCV: 90.8 fl (ref 78.0–100.0)
Monocytes Absolute: 0.4 10*3/uL (ref 0.1–1.0)
Monocytes Relative: 5.2 % (ref 3.0–12.0)
Neutro Abs: 3.5 10*3/uL (ref 1.4–7.7)
Neutrophils Relative %: 50.5 % (ref 43.0–77.0)
Platelets: 342 10*3/uL (ref 150.0–400.0)
RBC: 4.66 Mil/uL (ref 3.87–5.11)
RDW: 12.9 % (ref 11.5–15.5)
WBC: 6.9 10*3/uL (ref 4.0–10.5)

## 2014-09-09 LAB — LIPID PANEL
Cholesterol: 175 mg/dL (ref 0–200)
HDL: 49.9 mg/dL (ref >39.00)
LDL Cholesterol: 113 mg/dL — ABNORMAL HIGH (ref 0–99)
NonHDL: 125.1
Total CHOL/HDL Ratio: 4
Triglycerides: 59 mg/dL (ref 0.0–149.0)
VLDL: 11.8 mg/dL (ref 0.0–40.0)

## 2014-09-09 LAB — TSH: TSH: 1.37 u[IU]/mL (ref 0.35–4.50)

## 2014-09-09 MED ORDER — FLUCONAZOLE 150 MG PO TABS: ORAL_TABLET | ORAL | Status: DC

## 2014-09-09 NOTE — Progress Notes (Signed)
Subjective:    Patient ID: [name], female    DOB: 1960-12-28, 53 y.o.   MRN: [id number]  HPI  Patient presents today for complete physical.  Immunizations: tdap today Diet: healthy Exercise: needs to exercise Colonoscopy: up to date Dexa: due Pap Smear: s/p hysterectomy Mammogram: up to date    Review of Systems  Constitutional: Negative for unexpected weight change.  HENT: Negative for hearing loss and rhinorrhea.   Eyes: Negative for visual disturbance.  Respiratory: Negative for cough.   Cardiovascular: Negative for chest pain.  Gastrointestinal: Negative for nausea and vomiting.  Genitourinary: Negative for dysuria and frequency.  Musculoskeletal: Positive for back pain and neck pain.       Right knee pain, right low back pain  Skin: Negative for rash.   Past Medical History  Diagnosis Date  . PVC's (premature ventricular contractions)   . GERD (gastroesophageal reflux disease)   . Chest pain   . RVOT ventricular tachycardia   . DJD (degenerative joint disease)   . Depression   . Migraine headache   . History of palpitations     History   Social History  . Marital Status: Married    Spouse Name: N/A    Number of Children: N/A  . Years of Education: N/A   Occupational History  . Not on file.   Social History Main Topics  . Smoking status: Current Every Day Smoker -- 1.00 packs/day    Types: Cigarettes  . Smokeless tobacco: Never Used  . Alcohol Use: No  . Drug Use: No  . Sexual Activity: Yes   Other Topics Concern  . Not on file   Social History Narrative   Occupation: retired [employer]   Married   Alcohol no   Drug use no, no caffiene   Tobacco use- yes quit Dec '08   Regular Exercise no   4 children; reports increased stress in due to caring for grandchildren   UNEMPLOYED    Past Surgical History  Procedure Laterality Date  . Tubal ligation    . Laminectomy    . Total abdominal hysterectomy    .  Cholecystectomy    . Back surgery  10/23/12  . Mri      every 3 mths /due to menigitis    Family History  Problem Relation Age of Onset  . Diabetes Mother     and multiplesclerosis  . Heart disease Father   . Hypertension Sister   . Kidney disease Sister     No Known Allergies  Current Outpatient Prescriptions on File Prior to Visit  Medication Sig Dispense Refill  . ALPRAZolam (XANAX) 0.5 MG tablet take 1 tablet by mouth at bedtime if needed 30 tablet 0  . cyclobenzaprine (FLEXERIL) 5 MG tablet Take 5 mg by mouth 2 (two) times daily as needed.     Marland Kitchen HYDROcodone-acetaminophen (NORCO) 7.5-325 MG per tablet Take 1 tablet by mouth every 6 (six) hours as needed for pain.    . ranitidine (ZANTAC) 150 MG tablet take 1 tablet by mouth twice a day 60 tablet 11   No current facility-administered medications on file prior to visit.    BP 119/82 mmHg  Pulse 75  Temp(Src) 98.3 F (36.8 C) (Oral)  Resp 16  Ht 5\' 6"  (1.676 m)  Wt 166 lb 9.6 oz (75.569 kg)  BMI 26.90 kg/m2  SpO2 100%       Objective:   Physical Exam  Physical Exam  Constitutional: She is oriented to person, place, and time. She appears well-developed and well-nourished. No distress.  HENT:  Head: Normocephalic and atraumatic.  Right Ear: Tympanic membrane and ear canal normal.  Left Ear: Tympanic membrane and ear canal normal.  Mouth/Throat: Oropharynx is clear and moist.  Eyes: Pupils are equal, round, and reactive to light. No scleral icterus.  Neck: Normal range of motion. No thyromegaly present.  Cardiovascular: Normal rate and regular rhythm.   No murmur heard. Pulmonary/Chest: Effort normal and breath sounds normal. No respiratory distress. He has no wheezes. She has no rales. She exhibits no tenderness.  Abdominal: Soft. Bowel sounds are normal. He exhibits no distension and no mass. There is no tenderness. There is no rebound and no guarding.  Musculoskeletal: She exhibits no edema.  Lymphadenopathy:     She has no cervical adenopathy.  Neurological: She is alert and oriented to person, place, and time. She has normal reflexes. She exhibits normal muscle tone. Coordination normal.  Skin: Skin is warm and dry.  Psychiatric: She has a normal mood and affect. Her behavior is normal. Judgment and thought content normal.  Breasts: Examined lying Right: Without masses, retractions, discharge or axillary adenopathy.  Left: Without masses, retractions, discharge or axillary adenopathy.  Pelvic: deferred          Assessment & Plan:         Assessment & Plan:

## 2014-09-09 NOTE — Progress Notes (Signed)
Pre visit review using our clinic review tool, if applicable. No additional management support is needed unless otherwise documented below in the visit note. 

## 2014-09-09 NOTE — Assessment & Plan Note (Signed)
Pt reports sx's improved but not resolved. Rx sent for diflucan.

## 2014-09-09 NOTE — Patient Instructions (Addendum)
Please complete lab work prior to leaving. Work hard on quitting smoking. We will contact you about bone density testing.  Try to add some low impact exercise such as walking or stationary bike, water aerobics. Please schedule a follow up appointment in 6 months.

## 2014-09-09 NOTE — Assessment & Plan Note (Signed)
Recommended continued healthy diet, add exercise,send for dexa.  Obtain routine non-fasting lab work.

## 2014-09-09 NOTE — Assessment & Plan Note (Signed)
Pt counseled on importance of smoking cessation.

## 2014-11-11 ENCOUNTER — Other Ambulatory Visit: Payer: Self-pay | Admitting: Family

## 2014-11-11 NOTE — Telephone Encounter (Signed)
Requesting:ALPRAZolam (XANAX) 0.5 MG tablet Contract on file UDS-No Last OV--09/09/2014 Last Refill--08/20/2014 #30 0 refills  Please Advise

## 2014-11-12 NOTE — Telephone Encounter (Signed)
See printed rx. Due for UDS.  Please ask pt to provide urine sample at time of pick up.

## 2014-11-12 NOTE — Telephone Encounter (Signed)
Notified pt and placed Rx at front desk for pick up.

## 2015-01-19 ENCOUNTER — Encounter (HOSPITAL_COMMUNITY): Payer: Self-pay | Admitting: Emergency Medicine

## 2015-01-19 ENCOUNTER — Emergency Department (HOSPITAL_COMMUNITY)
Admission: EM | Admit: 2015-01-19 | Discharge: 2015-01-19 | Disposition: A | Discharge status: Home / Self Care | Payer: Medicare Other | Attending: Emergency Medicine | Admitting: Emergency Medicine

## 2015-01-19 DIAGNOSIS — F329 Major depressive disorder, single episode, unspecified: Secondary | ICD-10-CM | POA: Diagnosis not present

## 2015-01-19 DIAGNOSIS — G43909 Migraine, unspecified, not intractable, without status migrainosus: Secondary | ICD-10-CM | POA: Diagnosis not present

## 2015-01-19 DIAGNOSIS — K219 Gastro-esophageal reflux disease without esophagitis: Secondary | ICD-10-CM | POA: Insufficient documentation

## 2015-01-19 DIAGNOSIS — M791 Myalgia: Secondary | ICD-10-CM | POA: Diagnosis not present

## 2015-01-19 DIAGNOSIS — Z720 Tobacco use: Secondary | ICD-10-CM | POA: Diagnosis not present

## 2015-01-19 DIAGNOSIS — Z79899 Other long term (current) drug therapy: Secondary | ICD-10-CM | POA: Diagnosis not present

## 2015-01-19 DIAGNOSIS — R0981 Nasal congestion: Secondary | ICD-10-CM | POA: Diagnosis present

## 2015-01-19 DIAGNOSIS — J069 Acute upper respiratory infection, unspecified: Secondary | ICD-10-CM

## 2015-01-19 MED ORDER — SALINE SPRAY 0.65 % NA SOLN: 1.0000 | NASAL | Status: DC | PRN

## 2015-01-19 MED ORDER — ALBUTEROL SULFATE HFA 108 (90 BASE) MCG/ACT IN AERS
4.0000 | INHALATION_SPRAY | Freq: Once | RESPIRATORY_TRACT | Status: AC
  Administered 2015-01-19: 4 via RESPIRATORY_TRACT
  Filled 2015-01-19: qty 6.7

## 2015-01-19 MED ORDER — ACETAMINOPHEN 500 MG PO TABS: 1000.0000 mg | ORAL_TABLET | Freq: Four times a day (QID) | ORAL | Status: DC | PRN

## 2015-01-19 MED ORDER — IBUPROFEN 400 MG PO TABS: 400.0000 mg | ORAL_TABLET | Freq: Four times a day (QID) | ORAL | Status: DC | PRN

## 2015-01-19 NOTE — ED Notes (Signed)
Pt reports headaches x 2 weeks followed by sinus pressure , Nasal congestion, productive cough and chills. Pt alert x4. NAD at this time.

## 2015-01-19 NOTE — Discharge Instructions (Signed)
Upper Respiratory Infection, Adult An upper respiratory infection (URI) is also sometimes known as the common cold. The upper respiratory tract includes the nose, sinuses, throat, trachea, and bronchi. Bronchi are the airways leading to the lungs. Most people improve within 1 week, but symptoms can last up to 2 weeks. A residual cough may last even longer.  CAUSES Many different viruses can infect the tissues lining the upper respiratory tract. The tissues become irritated and inflamed and often become very moist. Mucus production is also common. A cold is contagious. You can easily spread the virus to others by oral contact. This includes kissing, sharing a glass, coughing, or sneezing. Touching your mouth or nose and then touching a surface, which is then touched by another person, can also spread the virus. SYMPTOMS  Symptoms typically develop 1 to 3 days after you come in contact with a cold virus. Symptoms vary from person to person. They may include:  Runny nose.  Sneezing.  Nasal congestion.  Sinus irritation.  Sore throat.  Loss of voice (laryngitis).  Cough.  Fatigue.  Muscle aches.  Loss of appetite.  Headache.  Low-grade fever. DIAGNOSIS  You might diagnose your own cold based on familiar symptoms, since most people get a cold 2 to 3 times a year. Your caregiver can confirm this based on your exam. Most importantly, your caregiver can check that your symptoms are not due to another disease such as strep throat, sinusitis, pneumonia, asthma, or epiglottitis. Blood tests, throat tests, and X-rays are not necessary to diagnose a common cold, but they may sometimes be helpful in excluding other more serious diseases. Your caregiver will decide if any further tests are required. RISKS AND COMPLICATIONS  You may be at risk for a more severe case of the common cold if you smoke cigarettes, have chronic heart disease (such as heart failure) or lung disease (such as asthma), or if  you have a weakened immune system. The very young and very old are also at risk for more serious infections. Bacterial sinusitis, middle ear infections, and bacterial pneumonia can complicate the common cold. The common cold can worsen asthma and chronic obstructive pulmonary disease (COPD). Sometimes, these complications can require emergency medical care and may be life-threatening. PREVENTION  The best way to protect against getting a cold is to practice good hygiene. Avoid oral or hand contact with people with cold symptoms. Wash your hands often if contact occurs. There is no clear evidence that vitamin C, vitamin E, echinacea, or exercise reduces the chance of developing a cold. However, it is always recommended to get plenty of rest and practice good nutrition. TREATMENT  Treatment is directed at relieving symptoms. There is no cure. Antibiotics are not effective, because the infection is caused by a virus, not by bacteria. Treatment may include:  Increased fluid intake. Sports drinks offer valuable electrolytes, sugars, and fluids.  Breathing heated mist or steam (vaporizer or shower).  Eating chicken soup or other clear broths, and maintaining good nutrition.  Getting plenty of rest.  Using gargles or lozenges for comfort.  Controlling fevers with ibuprofen or acetaminophen as directed by your caregiver.  Increasing usage of your inhaler if you have asthma. Zinc gel and zinc lozenges, taken in the first 24 hours of the common cold, can shorten the duration and lessen the severity of symptoms. Pain medicines may help with fever, muscle aches, and throat pain. A variety of non-prescription medicines are available to treat congestion and runny nose. Your caregiver   can make recommendations and may suggest nasal or lung inhalers for other symptoms.  HOME CARE INSTRUCTIONS   Only take over-the-counter or prescription medicines for pain, discomfort, or fever as directed by your  caregiver.  Use a warm mist humidifier or inhale steam from a shower to increase air moisture. This may keep secretions moist and make it easier to breathe.  Drink enough water and fluids to keep your urine clear or pale yellow.  Rest as needed.  Return to work when your temperature has returned to normal or as your caregiver advises. You may need to stay home longer to avoid infecting others. You can also use a face mask and careful hand washing to prevent spread of the virus. SEEK MEDICAL CARE IF:   After the first few days, you feel you are getting worse rather than better.  You need your caregiver's advice about medicines to control symptoms.  You develop chills, worsening shortness of breath, or brown or red sputum. These may be signs of pneumonia.  You develop yellow or brown nasal discharge or pain in the face, especially when you bend forward. These may be signs of sinusitis.  You develop a fever, swollen neck glands, pain with swallowing, or white areas in the back of your throat. These may be signs of strep throat. SEEK IMMEDIATE MEDICAL CARE IF:   You have a fever.  You develop severe or persistent headache, ear pain, sinus pain, or chest pain.  You develop wheezing, a prolonged cough, cough up blood, or have a change in your usual mucus (if you have chronic lung disease).  You develop sore muscles or a stiff neck. Document Released: 04/10/2001 Document Revised: 01/07/2012 Document Reviewed: 01/20/2014 [employer] Patient Information 2015 [employer]. This information is not intended to replace advice given to you by your health care provider. Make sure you discuss any questions you have with your health care provider.  

## 2015-01-19 NOTE — ED Notes (Signed)
Patient tablet left in the room, Patient visiting husband in room A6.. This nurse returned patient's tablet to them.

## 2015-01-19 NOTE — ED Provider Notes (Signed)
CSN: [id number]     Arrival date & time 01/19/15  1023 History   First MD Initiated Contact with Patient 01/19/15 1027     Chief Complaint  Patient presents with  . Nasal Congestion  . Chills     (Consider location/radiation/quality/duration/timing/severity/associated sxs/prior Treatment) Patient is a 54 y.o. female presenting with URI.  URI Presenting symptoms: congestion, cough and facial pain   Severity:  Moderate Onset quality:  Gradual Duration:  7 days (mild for past week, then worse last night) Timing:  Constant Progression:  Worsening Chronicity:  Recurrent Relieved by:  Nothing Worsened by:  Nothing tried Ineffective treatments: decongestants. Associated symptoms: myalgias and sinus pain   Associated symptoms comment:  Chills Risk factors: no sick contacts     Past Medical History  Diagnosis Date  . PVC's (premature ventricular contractions)   . GERD (gastroesophageal reflux disease)   . Chest pain   . RVOT ventricular tachycardia   . DJD (degenerative joint disease)   . Depression   . Migraine headache   . History of palpitations    Past Surgical History  Procedure Laterality Date  . Tubal ligation    . Laminectomy    . Total abdominal hysterectomy    . Cholecystectomy    . Back surgery  10/23/12  . Mri      every 3 mths /due to menigitis   Family History  Problem Relation Age of Onset  . Diabetes Mother     and multiplesclerosis  . Heart disease Father   . Hypertension Sister   . Kidney disease Sister    History  Substance Use Topics  . Smoking status: Current Every Day Smoker -- 1.00 packs/day    Types: Cigarettes  . Smokeless tobacco: Never Used  . Alcohol Use: No   OB History    No data available     Review of Systems  HENT: Positive for congestion.   Respiratory: Positive for cough.   Musculoskeletal: Positive for myalgias.  All other systems reviewed and are negative.     Allergies  Review of patient's allergies indicates  no known allergies.  Home Medications   Prior to Admission medications   Medication Sig Start Date End Date Taking? Authorizing Provider  ALPRAZolam Duanne Moron) 0.5 MG tablet take 1 tablet by mouth at bedtime if needed 11/12/14  Yes Debbrah Alar, NP  cyclobenzaprine (FLEXERIL) 5 MG tablet Take 5 mg by mouth 2 (two) times daily as needed for muscle spasms.  03/19/13  Yes Historical Provider, MD  HYDROcodone-acetaminophen (NORCO) 7.5-325 MG per tablet Take 1 tablet by mouth every 6 (six) hours as needed for pain.   Yes Historical Provider, MD  ranitidine (ZANTAC) 150 MG tablet take 1 tablet by mouth twice a day 05/11/13  Yes Neena Rhymes, MD  fluconazole (DIFLUCAN) 150 MG tablet One tab by mouth today, may repeat in 3 days as needed for yeast infection. 09/09/14   Debbrah Alar, NP   BP 134/83 mmHg  Pulse 96  Temp(Src) 98 F (36.7 C) (Oral)  Resp 20  SpO2 100% Physical Exam  Constitutional: She is oriented to person, place, and time. She appears well-developed and well-nourished. No distress.  HENT:  Head: Normocephalic and atraumatic.  Nose: Right sinus exhibits maxillary sinus tenderness and frontal sinus tenderness. Left sinus exhibits maxillary sinus tenderness and frontal sinus tenderness.  Mouth/Throat: Mucous membranes are normal. Posterior oropharyngeal erythema (mild) present. No posterior oropharyngeal edema or tonsillar abscesses.  Eyes: Conjunctivae are normal. Pupils are  equal, round, and reactive to light. No scleral icterus.  Neck: Neck supple.  Cardiovascular: Normal rate, regular rhythm, normal heart sounds and intact distal pulses.   No murmur heard. Pulmonary/Chest: Effort normal and breath sounds normal. No stridor. No respiratory distress. She has no rales.  Abdominal: Soft. Bowel sounds are normal. She exhibits no distension. There is no tenderness.  Musculoskeletal: Normal range of motion.  Neurological: She is alert and oriented to person, place, and time.   Skin: Skin is warm and dry. No rash noted.  Psychiatric: She has a normal mood and affect. Her behavior is normal.  Nursing note and vitals reviewed.   ED Course  Procedures (including critical care time) Labs Review Labs Reviewed - No data to display  Imaging Review No results found.   EKG Interpretation None      MDM   Final diagnoses:  Acute URI    54 yo female with URI symptoms.  Mild for a week, then worse last night.  Nontoxic appearance, stable vitals.  Lungs clear, but frequent cough.  Will try albuterol to see if it improves cough.  Otherwise, advised supportive care and PCP follow up.     Serita Grit, MD 01/19/15 1114

## 2015-01-24 ENCOUNTER — Other Ambulatory Visit: Payer: Self-pay

## 2015-01-24 ENCOUNTER — Telehealth: Payer: Self-pay | Admitting: Family

## 2015-01-24 NOTE — Telephone Encounter (Signed)
Patient would like to establish here at the [place] office due to location

## 2015-01-25 ENCOUNTER — Ambulatory Visit (INDEPENDENT_AMBULATORY_CARE_PROVIDER_SITE_OTHER): Payer: Medicare Other | Admitting: Internal Medicine

## 2015-01-25 ENCOUNTER — Encounter: Payer: Self-pay | Admitting: Internal Medicine

## 2015-01-25 VITALS — BP 122/68 | HR 72 | Temp 97.8°F | Ht 66.0 in | Wt 167.4 lb

## 2015-01-25 DIAGNOSIS — J0100 Acute maxillary sinusitis, unspecified: Secondary | ICD-10-CM | POA: Diagnosis not present

## 2015-01-25 MED ORDER — AMOXICILLIN-POT CLAVULANATE 875-125 MG PO TABS: 1.0000 | ORAL_TABLET | Freq: Two times a day (BID) | ORAL | Status: DC

## 2015-01-25 MED ORDER — PREDNISONE 10 MG PO TABS: 10.0000 mg | ORAL_TABLET | Freq: Every day | ORAL | Status: DC

## 2015-01-25 NOTE — Progress Notes (Signed)
Subjective:    Patient ID: [name], female    DOB: 1961-07-28, 54 y.o.   MRN: [id number]  DOS:  01/25/2015 Type of visit - description : acute Interval history: Went to the ER few days ago with URI, was prescribed conservative treatment, feeling worse. Continue with cough, chills, feeling cold. Has developed bilateral facial pain, has a headache which is moderate. Taking Mucinex and Tylenol without much help.    Review of Systems + Nausea, vomited twice today, no hemoptysis + Subjective fever No chest no difficulty breathing She had some chest congestion with the onset of symptoms but that is not getting worse.  Past Medical History  Diagnosis Date  . PVC's (premature ventricular contractions)   . GERD (gastroesophageal reflux disease)   . Chest pain   . RVOT ventricular tachycardia   . DJD (degenerative joint disease)   . Depression   . Migraine headache   . History of palpitations     Past Surgical History  Procedure Laterality Date  . Tubal ligation    . Laminectomy    . Total abdominal hysterectomy    . Cholecystectomy    . Back surgery  10/23/12  . Mri      every 3 mths /due to menigitis    History   Social History  . Marital Status: Married    Spouse Name: N/A  . Number of Children: N/A  . Years of Education: N/A   Occupational History  . Not on file.   Social History Main Topics  . Smoking status: Current Every Day Smoker -- 1.00 packs/day    Types: Cigarettes  . Smokeless tobacco: Never Used  . Alcohol Use: No  . Drug Use: No  . Sexual Activity: Yes   Other Topics Concern  . Not on file   Social History Narrative   Occupation: retired [employer]   Married   Alcohol no   Drug use no, no caffiene   Tobacco use- yes quit Dec '08   Regular Exercise no   4 children; reports increased stress in due to caring for grandchildren   UNEMPLOYED        Medication List       This list is accurate as of:  01/25/15  1:08 PM.  Always use your most recent med list.               acetaminophen 500 MG tablet  Commonly known as:  TYLENOL  Take 2 tablets (1,000 mg total) by mouth every 6 (six) hours as needed for mild pain, fever or headache.     ALPRAZolam 0.5 MG tablet  Commonly known as:  XANAX  take 1 tablet by mouth at bedtime if needed     amoxicillin-clavulanate 875-125 MG per tablet  Commonly known as:  AUGMENTIN  Take 1 tablet by mouth 2 (two) times daily.     cyclobenzaprine 5 MG tablet  Commonly known as:  FLEXERIL  Take 5 mg by mouth 2 (two) times daily as needed for muscle spasms.     HYDROcodone-acetaminophen 7.5-325 MG per tablet  Commonly known as:  NORCO  Take 1 tablet by mouth every 6 (six) hours as needed for pain.     ibuprofen 400 MG tablet  Commonly known as:  ADVIL,MOTRIN  Take 1 tablet (400 mg total) by mouth every 6 (six) hours as needed for fever, headache or mild pain.     predniSONE 10 MG tablet  Commonly known as:  DELTASONE  Take 1 tablet (10 mg total) by mouth daily. 2 tabs a day x 5 days     ranitidine 150 MG tablet  Commonly known as:  ZANTAC  take 1 tablet by mouth twice a day     sodium chloride 0.65 % Soln nasal spray  Commonly known as:  OCEAN  Place 1 spray into both nostrils as needed for congestion.           Objective:   Physical Exam BP 122/68 mmHg  Pulse 72  Temp(Src) 97.8 F (36.6 C) (Oral)  Ht 5\' 6"  (1.676 m)  Wt 167 lb 6 oz (75.921 kg)  BMI 27.03 kg/m2  SpO2 98% General:   Well developed, well nourished . NAD.  HEENT:  Normocephalic . Face symmetric, atraumatic. Nose congested, right maxillary sinus TTP, tympanic members normal, EOMI Lungs:  CTA B Normal respiratory effort, no intercostal retractions, no accessory muscle use. Heart: RRR,  no murmur.  Muscle skeletal: no pretibial edema bilaterally  Skin: Not pale. Not jaundice Neurologic:  alert & oriented X3.  Speech normal, gait appropriate for age and  unassisted Psych--  Cognition and judgment appear intact.  Cooperative with normal attention span and concentration.  Behavior appropriate. No anxious or depressed appearing.        Assessment & Plan:    Sinusitis, Symptoms consistent with sinusitis, will prescribe Augmentin, Flonase, prednisone and continue with Mucinex DM or similar OTC. See instructions

## 2015-01-25 NOTE — Patient Instructions (Signed)
Rest, fluids , tylenol If  cough, take Mucinex DM twice a day as needed  You can take hydrocodone at bedtime, in addition to help the pain will help with the cough Use OTC Nasocort or Flonase : 2 nasal sprays on each side of the nose daily until you feel better Take the antibiotic as prescribed  (Augmentinn) Take prednisone x 5 days  Call if not gradually better over the next  10 days Call anytime if the symptoms are severe

## 2015-01-25 NOTE — Progress Notes (Signed)
Pre visit review using our clinic review tool, if applicable. No additional management support is needed unless otherwise documented below in the visit note. 

## 2015-01-26 NOTE — Telephone Encounter (Signed)
Ok with me 

## 2015-03-17 ENCOUNTER — Ambulatory Visit (INDEPENDENT_AMBULATORY_CARE_PROVIDER_SITE_OTHER): Payer: Medicare Other | Admitting: Family

## 2015-03-17 ENCOUNTER — Encounter: Payer: Self-pay | Admitting: Family

## 2015-03-17 VITALS — BP 132/90 | HR 70 | Temp 98.0°F | Resp 18 | Ht 66.0 in | Wt 171.0 lb

## 2015-03-17 DIAGNOSIS — F411 Generalized anxiety disorder: Secondary | ICD-10-CM | POA: Diagnosis not present

## 2015-03-17 DIAGNOSIS — K219 Gastro-esophageal reflux disease without esophagitis: Secondary | ICD-10-CM | POA: Diagnosis not present

## 2015-03-17 DIAGNOSIS — J329 Chronic sinusitis, unspecified: Secondary | ICD-10-CM | POA: Insufficient documentation

## 2015-03-17 DIAGNOSIS — J320 Chronic maxillary sinusitis: Secondary | ICD-10-CM

## 2015-03-17 MED ORDER — RANITIDINE HCL 150 MG PO TABS: 150.0000 mg | ORAL_TABLET | Freq: Two times a day (BID) | ORAL | Status: DC

## 2015-03-17 MED ORDER — ALPRAZOLAM 0.5 MG PO TABS: ORAL_TABLET | ORAL | Status: DC

## 2015-03-17 NOTE — Assessment & Plan Note (Signed)
Symptoms of chronic sinusitis coming going on for several years. Previously maintained on Sudafed. Start Flonase. Start Coricidin HBP. If symptoms worsen or fail to improve with this new regimen, refer to ENT for further evaluation and workup.

## 2015-03-17 NOTE — Progress Notes (Signed)
Pre visit review using our clinic review tool, if applicable. No additional management support is needed unless otherwise documented below in the visit note. 

## 2015-03-17 NOTE — Assessment & Plan Note (Signed)
Stable with current regimen of Xanax as needed. Takes Xanax infrequently. Continue current dosage of Xanax.

## 2015-03-17 NOTE — Progress Notes (Signed)
Subjective:    Patient ID: [name], female    DOB: 07-Nov-1960, 54 y.o.   MRN: [id number]  Chief Complaint  Patient presents with  . Establish Care    feels like she needs to take something daily for sinuses they are bothering her, also needs refill of xanax and zantac    HPI:  [name] is a 54 y.o. female with a PMH of migraines, GERD, anxiety, and tobacco abuse who presents today for an office visit to establish care with this provider.   1) Reflux - Stable and maintained on zantac. Takes the medication as prescribed and denies adverse side effects.   2) Anxiety - Stable with current regimen of Xanax. Indicates she takes them possibly once per week to help her sleep. Takes the medication as prescribed and denies adverse side effects.   3) Sinus issues - associated symptom of continued sinus issues have been going on for about the last 5 years. Has previously used modifying factor of Sudafed which helps a little. Denies current illness.    No Known Allergies   Current Outpatient Prescriptions on File Prior to Visit  Medication Sig Dispense Refill  . cyclobenzaprine (FLEXERIL) 5 MG tablet Take 5 mg by mouth 2 (two) times daily as needed for muscle spasms.     Marland Kitchen HYDROcodone-acetaminophen (NORCO) 7.5-325 MG per tablet Take 1 tablet by mouth every 6 (six) hours as needed for pain.     No current facility-administered medications on file prior to visit.    Past Medical History  Diagnosis Date  . PVC's (premature ventricular contractions)   . GERD (gastroesophageal reflux disease)   . Chest pain   . RVOT ventricular tachycardia   . DJD (degenerative joint disease)   . Depression   . Migraine headache   . History of palpitations      Review of Systems  Constitutional: Negative for fever and chills.  HENT: Positive for sinus pressure. Negative for congestion, sneezing and sore throat.   Respiratory: Negative for cough, chest tightness and shortness of breath.    Cardiovascular: Negative for chest pain, palpitations and leg swelling.  Gastrointestinal: Negative for nausea and abdominal pain.  Neurological: Negative for headaches.      Objective:    BP 132/90 mmHg  Pulse 70  Temp(Src) 98 F (36.7 C) (Oral)  Resp 18  Ht 5\' 6"  (1.676 m)  Wt 171 lb (77.565 kg)  BMI 27.61 kg/m2  SpO2 93% Nursing note and vital signs reviewed.  Physical Exam  Constitutional: She is oriented to person, place, and time. She appears well-developed and well-nourished. No distress.  HENT:  Right Ear: Hearing, tympanic membrane, external ear and ear canal normal.  Left Ear: Hearing, tympanic membrane, external ear and ear canal normal.  Nose: Right sinus exhibits maxillary sinus tenderness. Right sinus exhibits no frontal sinus tenderness. Left sinus exhibits maxillary sinus tenderness. Left sinus exhibits no frontal sinus tenderness.  Mouth/Throat: Uvula is midline, oropharynx is clear and moist and mucous membranes are normal.  Neck: Neck supple.  Cardiovascular: Normal rate, regular rhythm, normal heart sounds and intact distal pulses.   Pulmonary/Chest: Effort normal and breath sounds normal.  Lymphadenopathy:    She has no cervical adenopathy.  Neurological: She is alert and oriented to person, place, and time.  Skin: Skin is warm and dry.  Psychiatric: She has a normal mood and affect. Her behavior is normal. Judgment and thought content normal.       Assessment &  Plan:

## 2015-03-17 NOTE — Assessment & Plan Note (Signed)
Stable with current dosage of ranitidine. Denies adverse side effects. Continue current dosage of ranitidine.

## 2015-03-17 NOTE — Patient Instructions (Signed)
Thank you for choosing  [facility].  Summary/Instructions:  Your prescription(s) have been submitted to your pharmacy or been printed and provided for you. Please take as directed and contact our office if you believe you are having problem(s) with the medication(s) or have any questions.  If your symptoms worsen or fail to improve, please contact our office for further instruction, or in case of emergency go directly to the emergency room at the closest medical facility.     

## 2015-04-14 ENCOUNTER — Other Ambulatory Visit: Payer: Self-pay | Admitting: Physical Medicine and Rehabilitation

## 2015-04-14 DIAGNOSIS — M542 Cervicalgia: Secondary | ICD-10-CM

## 2015-04-30 ENCOUNTER — Ambulatory Visit
Admission: RE | Admit: 2015-04-30 | Discharge: 2015-04-30 | Disposition: A | Discharge status: Home / Self Care | Payer: Medicare Other | Source: Ambulatory Visit | Attending: Physical Medicine and Rehabilitation | Admitting: Physical Medicine and Rehabilitation

## 2015-04-30 DIAGNOSIS — M542 Cervicalgia: Secondary | ICD-10-CM

## 2015-04-30 MED ORDER — GADOBENATE DIMEGLUMINE 529 MG/ML IV SOLN
16.0000 mL | Freq: Once | INTRAVENOUS | Status: AC | PRN
  Administered 2015-04-30: 16 mL via INTRAVENOUS

## 2015-07-26 ENCOUNTER — Other Ambulatory Visit: Payer: Self-pay | Admitting: Family

## 2015-07-27 NOTE — Telephone Encounter (Signed)
Last refill 5/19

## 2015-12-02 ENCOUNTER — Ambulatory Visit (INDEPENDENT_AMBULATORY_CARE_PROVIDER_SITE_OTHER): Payer: Medicare Other | Admitting: Family

## 2015-12-02 ENCOUNTER — Encounter: Payer: Self-pay | Admitting: Family

## 2015-12-02 VITALS — BP 122/82 | HR 87 | Temp 98.0°F | Resp 16 | Ht 66.0 in | Wt 172.0 lb

## 2015-12-02 DIAGNOSIS — Z23 Encounter for immunization: Secondary | ICD-10-CM | POA: Diagnosis not present

## 2015-12-02 DIAGNOSIS — J0100 Acute maxillary sinusitis, unspecified: Secondary | ICD-10-CM | POA: Diagnosis not present

## 2015-12-02 MED ORDER — AMOXICILLIN-POT CLAVULANATE 875-125 MG PO TABS: 1.0000 | ORAL_TABLET | Freq: Two times a day (BID) | ORAL | Status: DC

## 2015-12-02 MED ORDER — ALPRAZOLAM 0.5 MG PO TABS: ORAL_TABLET | ORAL | Status: DC

## 2015-12-02 NOTE — Progress Notes (Signed)
   Subjective:    Patient ID: [name], female    DOB: [birth year], 55 y.o.   MRN: [id number]  Chief Complaint  Patient presents with  . Nasal Congestion    x2 months, drainage, congestion, sinus pressure and pain    HPI:  [name] is a 55 y.o. female who  has a past medical history of PVC's (premature ventricular contractions); GERD (gastroesophageal reflux disease); Chest pain; RVOT ventricular tachycardia ([place]); DJD (degenerative joint disease); Depression; Migraine headache; and History of palpitations. and presents today for an acute office visit.  This is a new problem. Associated symptoms of drainage, congestion, sinus pressure and pain that has been going on for about 2 months. Has had worsening the last 2 weeks. Modifying factors include antihistamine and decongestants. Denies fevers. Occasional cough. Timing of symptoms is worse in the mornings with congestion and increased pressure at night. No recent antibiotic.    No Known Allergies   Current Outpatient Prescriptions on File Prior to Visit  Medication Sig Dispense Refill  . cyclobenzaprine (FLEXERIL) 5 MG tablet Take 5 mg by mouth 2 (two) times daily as needed for muscle spasms.     Marland Kitchen HYDROcodone-acetaminophen (NORCO) 7.5-325 MG per tablet Take 1 tablet by mouth every 6 (six) hours as needed for pain.    . ranitidine (ZANTAC) 150 MG tablet Take 1 tablet (150 mg total) by mouth 2 (two) times daily. 60 tablet 11   No current facility-administered medications on file prior to visit.    Review of Systems  Constitutional: Negative for fever and chills.  HENT: Positive for congestion and sinus pressure. Negative for ear pain and sore throat.   Respiratory: Positive for cough. Negative for chest tightness and shortness of breath.   Neurological: Positive for headaches.  All other systems reviewed and are negative.     Objective:    BP 122/82 mmHg  Pulse 87  Temp(Src) 98 F (36.7 C) (Oral)  Resp 16  Ht 5'  6" (1.676 m)  Wt 172 lb (78.019 kg)  BMI 27.77 kg/m2  SpO2 99% Nursing note and vital signs reviewed.  Physical Exam  Constitutional: She is oriented to person, place, and time. She appears well-developed and well-nourished. No distress.  HENT:  Right Ear: Hearing, tympanic membrane, external ear and ear canal normal.  Left Ear: Hearing, tympanic membrane, external ear and ear canal normal.  Nose: Right sinus exhibits maxillary sinus tenderness and frontal sinus tenderness. Left sinus exhibits maxillary sinus tenderness and frontal sinus tenderness.  Mouth/Throat: Uvula is midline, oropharynx is clear and moist and mucous membranes are normal.  Neck: Neck supple.  Cardiovascular: Normal rate, regular rhythm, normal heart sounds and intact distal pulses.   Pulmonary/Chest: Effort normal and breath sounds normal.  Neurological: She is alert and oriented to person, place, and time.  Skin: Skin is warm and dry.  Psychiatric: She has a normal mood and affect. Her behavior is normal. Judgment and thought content normal.       Assessment & Plan:   Problem List Items Addressed This Visit      Respiratory   Acute sinusitis - Primary    Symptoms and exam consistent bacterial sinusitis. Start Augmentin. Continue over-the-counter medications as needed for symptom relief and supportive care. Follow-up if symptoms worsen or fail to improve.      Relevant Medications   amoxicillin-clavulanate (AUGMENTIN) 875-125 MG tablet

## 2015-12-02 NOTE — Progress Notes (Signed)
Pre visit review using our clinic review tool, if applicable. No additional management support is needed unless otherwise documented below in the visit note. 

## 2015-12-02 NOTE — Assessment & Plan Note (Signed)
Symptoms and exam consistent bacterial sinusitis. Start Augmentin. Continue over-the-counter medications as needed for symptom relief and supportive care. Follow-up if symptoms worsen or fail to improve.

## 2015-12-02 NOTE — Patient Instructions (Addendum)
Thank you for choosing [facility].  Summary/Instructions:  Your prescription(s) have been submitted to your pharmacy or been printed and provided for you. Please take as directed and contact our office if you believe you are having problem(s) with the medication(s) or have any questions.  If your symptoms worsen or fail to improve, please contact our office for further instruction, or in case of emergency go directly to the emergency room at the closest medical facility.   General Recommendations:    Please drink plenty of fluids.  Get plenty of rest   Sleep in humidified air  Use saline nasal sprays  Netti pot   OTC Medications:  Decongestants - helps relieve congestion   Flonase (generic fluticasone) or Nasacort (generic triamcinolone) - please make sure to use the "cross-over" technique at a 45 degree angle towards the opposite eye as opposed to straight up the nasal passageway.   Sudafed (generic pseudoephedrine - Note this is the one that is available behind the pharmacy counter); Products with phenylephrine (-PE) may also be used but is often not as effective as pseudoephedrine.   If you have HIGH BLOOD PRESSURE - Coricidin HBP; AVOID any product that is -D as this contains pseudoephedrine which may increase your blood pressure.  Afrin (oxymetazoline) every 6-8 hours for up to 3 days.   Allergies - helps relieve runny nose, itchy eyes and sneezing   Claritin (generic loratidine), Allegra (fexofenidine), or Zyrtec (generic cyrterizine) for runny nose. These medications should not cause drowsiness.  Note - Benadryl (generic diphenhydramine) may be used however may cause drowsiness  Cough -   Delsym or Robitussin (generic dextromethorphan)  Expectorants - helps loosen mucus to ease removal   Mucinex (generic guaifenesin) as directed on the package.  Headaches / General Aches   Tylenol (generic acetaminophen) - DO NOT EXCEED 3 grams (3,000 mg) in a 24  hour time period  Advil/Motrin (generic ibuprofen)   Sore Throat -   Salt water gargle   Chloraseptic (generic benzocaine) spray or lozenges / Sucrets (generic dyclonine)    Sinusitis Sinusitis is redness, soreness, and inflammation of the paranasal sinuses. Paranasal sinuses are air pockets within the bones of your face (beneath the eyes, the middle of the forehead, or above the eyes). In healthy paranasal sinuses, mucus is able to drain out, and air is able to circulate through them by way of your nose. However, when your paranasal sinuses are inflamed, mucus and air can become trapped. This can allow bacteria and other germs to grow and cause infection. Sinusitis can develop quickly and last only a short time (acute) or continue over a long period (chronic). Sinusitis that lasts for more than 12 weeks is considered chronic.  CAUSES  Causes of sinusitis include:  Allergies.  Structural abnormalities, such as displacement of the cartilage that separates your nostrils (deviated septum), which can decrease the air flow through your nose and sinuses and affect sinus drainage.  Functional abnormalities, such as when the small hairs (cilia) that line your sinuses and help remove mucus do not work properly or are not present. SIGNS AND SYMPTOMS  Symptoms of acute and chronic sinusitis are the same. The primary symptoms are pain and pressure around the affected sinuses. Other symptoms include:  Upper toothache.  Earache.  Headache.  Bad breath.  Decreased sense of smell and taste.  A cough, which worsens when you are lying flat.  Fatigue.  Fever.  Thick drainage from your nose, which often is green and may   contain pus (purulent).  Swelling and warmth over the affected sinuses. DIAGNOSIS  Your health care provider will perform a physical exam. During the exam, your health care provider may:  Look in your nose for signs of abnormal growths in your nostrils (nasal  polyps).  Tap over the affected sinus to check for signs of infection.  View the inside of your sinuses (endoscopy) using an imaging device that has a light attached (endoscope). If your health care provider suspects that you have chronic sinusitis, one or more of the following tests may be recommended:  Allergy tests.  Nasal culture. A sample of mucus is taken from your nose, sent to a lab, and screened for bacteria.  Nasal cytology. A sample of mucus is taken from your nose and examined by your health care provider to determine if your sinusitis is related to an allergy. TREATMENT  Most cases of acute sinusitis are related to a viral infection and will resolve on their own within 10 days. Sometimes medicines are prescribed to help relieve symptoms (pain medicine, decongestants, nasal steroid sprays, or saline sprays).  However, for sinusitis related to a bacterial infection, your health care provider will prescribe antibiotic medicines. These are medicines that will help kill the bacteria causing the infection.  Rarely, sinusitis is caused by a fungal infection. In theses cases, your health care provider will prescribe antifungal medicine. For some cases of chronic sinusitis, surgery is needed. Generally, these are cases in which sinusitis recurs more than 3 times per year, despite other treatments. HOME CARE INSTRUCTIONS   Drink plenty of water. Water helps thin the mucus so your sinuses can drain more easily.  Use a humidifier.  Inhale steam 3 to 4 times a day (for example, sit in the bathroom with the shower running).  Apply a warm, moist washcloth to your face 3 to 4 times a day, or as directed by your health care provider.  Use saline nasal sprays to help moisten and clean your sinuses.  Take medicines only as directed by your health care provider.  If you were prescribed either an antibiotic or antifungal medicine, finish it all even if you start to feel better. SEEK IMMEDIATE  MEDICAL CARE IF:  You have increasing pain or severe headaches.  You have nausea, vomiting, or drowsiness.  You have swelling around your face.  You have vision problems.  You have a stiff neck.  You have difficulty breathing. MAKE SURE YOU:   Understand these instructions.  Will watch your condition.  Will get help right away if you are not doing well or get worse. Document Released: 10/15/2005 Document Revised: 03/01/2014 Document Reviewed: 10/30/2011 ExitCare Patient Information 2015 [employer]. This information is not intended to replace advice given to you by your health care provider. Make sure you discuss any questions you have with your health care provider.   

## 2015-12-06 ENCOUNTER — Telehealth: Payer: Self-pay | Admitting: Family

## 2015-12-06 MED ORDER — DOXYCYCLINE HYCLATE 100 MG PO TABS: 100.0000 mg | ORAL_TABLET | Freq: Two times a day (BID) | ORAL | Status: DC

## 2015-12-06 NOTE — Telephone Encounter (Signed)
Please advise 

## 2015-12-06 NOTE — Telephone Encounter (Signed)
Pt called in and said that the meds that [place] sent her in is making her sick and would like to know if she can have something different.  She said that she is wanting the steroid pack that he sent her in last year.    [facility] [place]

## 2015-12-06 NOTE — Telephone Encounter (Signed)
Doxycycline sent to pharmacy

## 2015-12-07 NOTE — Telephone Encounter (Signed)
LVM letting pt know.  

## 2015-12-08 MED ORDER — PREDNISONE 20 MG PO TABS: 20.0000 mg | ORAL_TABLET | Freq: Two times a day (BID) | ORAL | Status: DC

## 2015-12-08 NOTE — Telephone Encounter (Signed)
Medication sent.

## 2015-12-08 NOTE — Telephone Encounter (Signed)
Pt is requesting you send in the steroid pack instead.

## 2015-12-08 NOTE — Telephone Encounter (Signed)
Please call patient back at [phone]

## 2015-12-08 NOTE — Addendum Note (Signed)
Addended by: Mauricio Po D on: 12/08/2015 10:23 PM   Modules accepted: Orders

## 2015-12-09 NOTE — Telephone Encounter (Signed)
Pt aware.

## 2016-02-28 ENCOUNTER — Other Ambulatory Visit: Payer: Self-pay

## 2016-02-28 DIAGNOSIS — Z1231 Encounter for screening mammogram for malignant neoplasm of breast: Secondary | ICD-10-CM

## 2016-03-07 ENCOUNTER — Encounter: Payer: Medicare Other | Admitting: Family

## 2016-03-07 DIAGNOSIS — Z0289 Encounter for other administrative examinations: Secondary | ICD-10-CM

## 2016-03-22 ENCOUNTER — Other Ambulatory Visit (INDEPENDENT_AMBULATORY_CARE_PROVIDER_SITE_OTHER): Payer: Medicare Other

## 2016-03-22 ENCOUNTER — Ambulatory Visit
Admission: RE | Admit: 2016-03-22 | Discharge: 2016-03-22 | Disposition: A | Discharge status: Home / Self Care | Payer: Medicare Other | Source: Ambulatory Visit

## 2016-03-22 ENCOUNTER — Encounter: Payer: Self-pay | Admitting: Family

## 2016-03-22 ENCOUNTER — Ambulatory Visit (INDEPENDENT_AMBULATORY_CARE_PROVIDER_SITE_OTHER): Payer: Medicare Other | Admitting: Family

## 2016-03-22 VITALS — BP 120/88 | HR 94 | Temp 97.6°F | Resp 16 | Ht 66.0 in | Wt 171.0 lb

## 2016-03-22 DIAGNOSIS — Z0001 Encounter for general adult medical examination with abnormal findings: Secondary | ICD-10-CM | POA: Insufficient documentation

## 2016-03-22 DIAGNOSIS — Z0000 Encounter for general adult medical examination without abnormal findings: Secondary | ICD-10-CM

## 2016-03-22 DIAGNOSIS — Z720 Tobacco use: Secondary | ICD-10-CM

## 2016-03-22 DIAGNOSIS — E663 Overweight: Secondary | ICD-10-CM | POA: Diagnosis not present

## 2016-03-22 DIAGNOSIS — Z1231 Encounter for screening mammogram for malignant neoplasm of breast: Secondary | ICD-10-CM

## 2016-03-22 LAB — COMPREHENSIVE METABOLIC PANEL
ALT: 17 U/L (ref 0–35)
AST: 15 U/L (ref 0–37)
Albumin: 4.4 g/dL (ref 3.5–5.2)
Alkaline Phosphatase: 84 U/L (ref 39–117)
BUN: 13 mg/dL (ref 6–23)
CO2: 27 mEq/L (ref 19–32)
Calcium: 9.7 mg/dL (ref 8.4–10.5)
Chloride: 105 mEq/L (ref 96–112)
Creatinine, Ser: 0.77 mg/dL (ref 0.40–1.20)
GFR: 100.11 mL/min (ref >60.00)
Glucose, Bld: 90 mg/dL (ref 70–99)
Potassium: 3.8 mEq/L (ref 3.5–5.1)
Sodium: 136 mEq/L (ref 135–145)
Total Bilirubin: 0.7 mg/dL (ref 0.2–1.2)
Total Protein: 8.2 g/dL (ref 6.0–8.3)

## 2016-03-22 LAB — LIPID PANEL
Cholesterol: 176 mg/dL (ref 0–200)
HDL: 49.1 mg/dL (ref >39.00)
LDL Cholesterol: 112 mg/dL — ABNORMAL HIGH (ref 0–99)
NonHDL: 126.67
Total CHOL/HDL Ratio: 4
Triglycerides: 72 mg/dL (ref 0.0–149.0)
VLDL: 14.4 mg/dL (ref 0.0–40.0)

## 2016-03-22 LAB — CBC
HCT: 42.3 % (ref 36.0–46.0)
Hemoglobin: 14.1 g/dL (ref 12.0–15.0)
MCHC: 33.3 g/dL (ref 30.0–36.0)
MCV: 86.6 fl (ref 78.0–100.0)
Platelets: 362 10*3/uL (ref 150.0–400.0)
RBC: 4.88 Mil/uL (ref 3.87–5.11)
RDW: 13.2 % (ref 11.5–15.5)
WBC: 7.1 10*3/uL (ref 4.0–10.5)

## 2016-03-22 MED ORDER — ALPRAZOLAM 0.5 MG PO TABS: ORAL_TABLET | ORAL | Status: DC

## 2016-03-22 NOTE — Progress Notes (Signed)
Subjective:    Patient ID: CECILYA VIRGA, female    DOB: 10/27/1961, 55 y.o.   MRN: [id number]  Chief Complaint  Patient presents with  . CPE    not fasting, refill of xanax    HPI:  DAMARY HOUNSHELL is a 55 y.o. female who presents today for an annual wellness visit.   1) Health Maintenance -   Diet - Averages about 2-3 meals consisting of chicken, beef, pork, vegetables, some fruits. Caffeine intake of 2-3 cups per day  Exercise - No structured exercise  2) Preventative Exams / Immunizations:  Dental -- Up to date  Vision -- Up to date    Health Maintenance  Topic Date Due  . Hepatitis C Screening  Jan 09, 1961  . HIV Screening  04/05/1976  . INFLUENZA VACCINE  05/29/2016  . COLONOSCOPY  05/04/2017  . MAMMOGRAM  03/22/2018  . TETANUS/TDAP  09/09/2024     Immunization History  Administered Date(s) Administered  . Influenza Split 11/24/2012  . Influenza,inj,Quad PF,36+ Mos 08/20/2014, 12/02/2015  . Tdap 09/09/2014    RISK FACTORS  Tobacco History  Smoking status  . Current Every Day Smoker -- 1.00 packs/day  . Types: Cigarettes  Smokeless tobacco  . Never Used     Cardiac risk factors: smoking/ tobacco exposure.  Depression Screen   Depression screen [facility] 2/9 03/22/2016  Decreased Interest 0  Down, Depressed, Hopeless 0  PHQ - 2 Score 0    Activities of Daily Living In your present state of health, do you have any difficulty performing the following activities?:  Driving? No Managing money?  No Feeding yourself? No Getting from bed to chair? No Climbing a flight of stairs? No Preparing food and eating?: No Bathing or showering? No Getting dressed: No Getting to the toilet? No Using the toilet: No Moving around from place to place: No In the past year have you fallen or had a near fall?:No   Home Safety Has smoke detector and wears seat belts. No firearms. No excess sun exposure. Are there smokers in your home (other than you)?   No Do you feel safe at home?  Yes  Hearing Difficulties: No Do you often ask people to speak up or repeat themselves? No Do you experience ringing or noises in your ears? No  Do you have difficulty understanding soft or whispered voices? No    Cognitive Testing  Alert? Yes   Normal Appearance? Yes  Oriented to person? Yes  Place? Yes   Time? Yes  Recall of three objects?  Yes  Can perform simple calculations? Yes  Displays appropriate judgment? Yes  Can read the correct time from a watch face? Yes  Do you feel that you have a problem with memory? No  Do you often misplace items? No   Advanced Directives have been discussed with the patient? Yes  Current Physicians/Providers and Suppliers  1. Terri Piedra, FNP - Internal Medicine 2. Dr. Velna Hatchet, MD - Pain management  Indicate any recent Medical Services you may have received from other than [facility] providers in the past year (date may be approximate).  All answers were reviewed with the patient and necessary referrals were made:  Mauricio Po, [place]   03/22/2016    No Known Allergies   Outpatient Prescriptions Prior to Visit  Medication Sig Dispense Refill  . cyclobenzaprine (FLEXERIL) 5 MG tablet Take 5 mg by mouth 2 (two) times daily as needed for muscle spasms.     . ranitidine (ZANTAC)  150 MG tablet Take 1 tablet (150 mg total) by mouth 2 (two) times daily. 60 tablet 11  . ALPRAZolam (XANAX) 0.5 MG tablet take 1 tablet by mouth at bedtime if needed 30 tablet 0  . doxycycline (VIBRA-TABS) 100 MG tablet Take 1 tablet (100 mg total) by mouth 2 (two) times daily. 20 tablet 0  . amoxicillin-clavulanate (AUGMENTIN) 875-125 MG tablet Take 1 tablet by mouth 2 (two) times daily. 20 tablet 0  . predniSONE (DELTASONE) 20 MG tablet Take 1 tablet (20 mg total) by mouth 2 (two) times daily with a meal. 10 tablet 0   No facility-administered medications prior to visit.     Past Medical History  Diagnosis Date  . PVC's (premature  ventricular contractions)   . GERD (gastroesophageal reflux disease)   . Chest pain   . 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 DJD (degenerative joint disease)   . Depression   . Migraine headache   . History of palpitations      Past Surgical History  Procedure Laterality Date  . Tubal ligation    . Laminectomy    . Total abdominal hysterectomy    . Cholecystectomy    . Back surgery  10/23/12  . Mri      every 3 mths /due to menigitis     Family History  Problem Relation Age of Onset  . Diabetes Mother     and multiplesclerosis  . Heart disease Father   . Hypertension Sister   . Kidney disease Sister      Social History   Social History  . Marital Status: Married    Spouse Name: N/A  . Number of Children: N/A  . Years of Education: N/A   Occupational History  . Not on file.   Social History Main Topics  . Smoking status: Current Every Day Smoker -- 1.00 packs/day    Types: Cigarettes  . Smokeless tobacco: Never Used  . Alcohol Use: No  . Drug Use: No  . Sexual Activity: Yes   Other Topics Concern  . Not on file   Social History Narrative   Occupation: retired [employer]   Married   Alcohol no   Drug use no, no caffiene   Tobacco use- yes quit Dec '08   Regular Exercise no   4 children; reports increased stress in due to caring for grandchildren   UNEMPLOYED     Review of Systems  Constitutional: Denies fever, chills, fatigue, or significant weight gain/loss. HENT: Head: Denies headache or neck pain Ears: Denies changes in hearing, ringing in ears, earache, drainage Nose: Denies discharge, stuffiness, itching, nosebleed, sinus pain Throat: Denies sore throat, hoarseness, dry mouth, sores, thrush Eyes: Denies loss/changes in vision, pain, redness, blurry/double vision, flashing lights Cardiovascular: Denies chest pain/discomfort, tightness, palpitations, shortness of breath with activity, difficulty lying down,  swelling, sudden awakening with shortness of breath Respiratory: Denies shortness of breath, cough, sputum production, wheezing Gastrointestinal: Denies dysphasia, heartburn, change in appetite, nausea, change in bowel habits, rectal bleeding, constipation, diarrhea, yellow skin or eyes Genitourinary: Denies frequency, urgency, burning/pain, blood in urine, incontinence, change in urinary strength. Musculoskeletal: Denies muscle/joint pain, stiffness, back pain, redness or swelling of joints, trauma Skin: Denies rashes, lumps, itching, dryness, color changes, or hair/nail changes Neurological: Denies dizziness, fainting, seizures, weakness, numbness, tingling, tremor Psychiatric - Denies nervousness, stress, depression or memory loss Endocrine: Denies heat or cold intolerance, sweating, frequent urination, excessive thirst, changes in appetite Hematologic: Denies ease of  bruising or bleeding    Objective:    BP 120/88 mmHg  Pulse 94  Temp(Src) 97.6 F (36.4 C) (Oral)  Resp 16  Ht 5\' 6"  (1.676 m)  Wt 171 lb (77.565 kg)  BMI 27.61 kg/m2  SpO2 97% Nursing note and vital signs reviewed.  Physical Exam  Constitutional: She is oriented to person, place, and time. She appears well-developed and well-nourished.  HENT:  Head: Normocephalic.  Right Ear: Hearing, tympanic membrane, external ear and ear canal normal.  Left Ear: Hearing, tympanic membrane, external ear and ear canal normal.  Nose: Nose normal.  Mouth/Throat: Uvula is midline, oropharynx is clear and moist and mucous membranes are normal.  Eyes: Conjunctivae and EOM are normal. Pupils are equal, round, and reactive to light.  Neck: Neck supple. No JVD present. No tracheal deviation present. No thyromegaly present.  Cardiovascular: Normal rate, regular rhythm, normal heart sounds and intact distal pulses.   Pulmonary/Chest: Effort normal and breath sounds normal.  Abdominal: Soft. Bowel sounds are normal. She exhibits no  distension and no mass. There is no tenderness. There is no rebound and no guarding.  Musculoskeletal: Normal range of motion. She exhibits no edema or tenderness.  Lymphadenopathy:    She has no cervical adenopathy.  Neurological: She is alert and oriented to person, place, and time. She has normal reflexes. No cranial nerve deficit. She exhibits normal muscle tone. Coordination normal.  Skin: Skin is warm and dry.  Psychiatric: She has a normal mood and affect. Her behavior is normal. Judgment and thought content normal.       Assessment & Plan:   During the course of the visit the patient was educated and counseled about appropriate screening and preventive services including:    Influenza vaccine  Colorectal cancer screening  Glaucoma screening  Nutrition counseling   Smoking cessation counseling  Diet review for nutrition referral? Yes ____  Not Indicated _X___   Patient Instructions (the written plan) was given to the patient.  Medicare Attestation I have personally reviewed: The patient's medical and social history Their use of alcohol, tobacco or illicit drugs Their current medications and supplements The patient's functional ability including ADLs,fall risks, home safety risks, cognitive, and hearing and visual impairment Diet and physical activities Evidence for depression or mood disorders  The patient's weight, height, BMI,  have been recorded in the chart.  I have made referrals, counseling, and provided education to the patient based on review of the above and I have provided the patient with a written personalized care plan for preventive services.     Mauricio Po, [place]   03/22/2016    Problem List Items Addressed This Visit      Other   Tobacco abuse    Continues to smoke. Discussed importance of cessation in prevention of future cardiovascular and respiratory diseases. Not ready to quite smoking at present, however is beginning to contemplate.        Preventative health care    1) Anticipatory Guidance: Discussed importance of wearing a seatbelt while driving and not texting while driving; changing batteries in smoke detector at least once annually; wearing suntan lotion when outside; eating a balanced and moderate diet; getting physical activity at least 30 minutes per day.  2) Immunizations / Screenings / Labs:  All immunizations are up-to-date per recommendations.Obtain hepatitis C antibody for hepatitis C screening. Mammogram completed today. Obtain vitamin D for vitamin D deficiency screening. All other screenings are up-to-date per recommendations. Obtain CBC, CMET, and  Lipid profile.  Overall well exam with risk factors for cardiovascular disease including tobacco use and sedentary lifestyle. Recommend weight loss approximate 5-10% of current body weight through nutrition and physical activity. Recommend increasing physical activity to 30 minutes of moderate level activity daily. Encourage nutritional intake that focuses on nutrient dense foods and is moderate, varied, and balanced and is low in saturated fats and processed/sugary foods. Discussed importance of tobacco cessation for reduction of cardiovascular and respiratory disease in the future. Patient is not ready to quit smoking at this time. Continue to monitor. Continue other healthy lifestyle behaviors and choices. Follow-up prevention exam in 1 year. Follow-up office visit pending blood work as needed for chronic conditions.      Relevant Orders   CBC   Comprehensive metabolic panel   Lipid panel   VITAMIN D 25 Hydroxy (Vit-D Deficiency, Fractures)   Hepatitis C antibody   Medicare annual wellness visit, subsequent - Primary    Reviewed and updated patient's medical, surgical, family and social history. Medications and allergies were also reviewed. Basic screenings for depression, activities of daily living, hearing, cognition and safety were performed. Provider list was  updated and health plan was provided to the patient.       Overweight (BMI 25.0-29.9)    Recommend weight loss of approximately 5-10% of current body weight through nutrition and physical activity.Recommend increasing physical activity to 30 minutes of moderate level activity daily. Encourage nutritional intake that focuses on nutrient dense foods and is moderate, varied, and balanced and is low in saturated fats and processed/sugary foods.

## 2016-03-22 NOTE — Patient Instructions (Addendum)
Thank you for choosing [facility].  Summary/Instructions:  Your prescription(s) have been submitted to your pharmacy or been printed and provided for you. Please take as directed and contact our office if you believe you are having problem(s) with the medication(s) or have any questions.  Please stop by the lab on the basement level of the building for your blood work. Your results will be released to [place] (or called to you) after review, usually within 72 hours after test completion. If any changes need to be made, you will be notified at that same time.   Health Maintenance  Topic Date Due  . Hepatitis C Screening  December 03, 1960  . HIV Screening  04/05/1976  . INFLUENZA VACCINE  05/29/2016  . COLONOSCOPY  05/04/2017  . MAMMOGRAM  03/22/2018  . TETANUS/TDAP  09/09/2024   Advance Directive Advance directives are the legal documents that allow you to make choices about your health care and medical treatment if you cannot speak for yourself. Advance directives are a way for you to communicate your wishes to family, friends, and health care providers. The specified people can then convey your decisions about end-of-life care to avoid confusion if you should become unable to communicate. Ideally, the process of discussing and writing advance directives should happen over time rather than making decisions all at once. Advance directives can be modified as your situation changes, and you can change your mind at any time, even after you have signed the advance directives. Each state has its own laws regarding advance directives. You may want to check with your health care provider, attorney, or state representative about the law in your state. Below are some examples of advance directives. LIVING WILL A living will is a set of instructions documenting your wishes about medical care when you cannot care for yourself. It is used if you become:  Terminally ill.  Incapacitated.  Unable to  communicate.  Unable to make decisions. Items to consider in your living will include:  The use or non-use of life-sustaining equipment, such as dialysis machines and breathing machines (ventilators).  A do not resuscitate (DNR) order, which is the instruction not to use cardiopulmonary resuscitation (CPR) if breathing or heartbeat stops.  Tube feeding.  Withholding of food and fluids.  Comfort (palliative) care when the goal becomes comfort rather than a cure.  Organ and tissue donation. A living will does not give instructions about distribution of your money and property if you should pass away. It is advisable to seek the expert advice of a lawyer in drawing up a will regarding your possessions. Decisions about taxes, beneficiaries, and asset distribution will be legally binding. This process can relieve your family and friends of any burdens surrounding disputes or questions that may come up about the allocation of your assets. DO NOT RESUSCITATE (DNR) A do not resuscitate (DNR) order is a request to not have CPR in the event that your heart stops beating or you stop breathing. Unless given other instructions, a health care provider will try to help any patient whose heart has stopped or who has stopped breathing.  HEALTH CARE PROXY AND DURABLE POWER OF ATTORNEY FOR HEALTH CARE A health care proxy is a person (agent) appointed to make medical decisions for you if you cannot. Generally, people choose someone they know well and trust to represent their preferences when they can no longer do so. You should be sure to ask this person for agreement to act as your agent. An agent may  have to exercise judgment in the event of a medical decision for which your wishes are not known. The durable power of attorney for health care is the legal document that names your health care proxy. Once written, it should be:  Signed.  Notarized.  Dated.  Copied.  Witnessed.  Incorporated into your  medical record. You may also want to appoint someone to manage your financial affairs if you cannot. This is called a durable power of attorney for finances. It is a separate legal document from the durable power of attorney for health care. You may choose the same person or someone different from your health care proxy to act as your agent in financial matters.   This information is not intended to replace advice given to you by your health care provider. Make sure you discuss any questions you have with your health care provider.   Document Released: 01/22/2008 Document Revised: 10/20/2013 Document Reviewed: 03/04/2013 Elsevier Interactive Patient Education 2016 [place] Maintenance, Female Adopting a healthy lifestyle and getting preventive care can go a long way to promote health and wellness. Talk with your health care provider about what schedule of regular examinations is right for you. This is a good chance for you to check in with your provider about disease prevention and staying healthy. In between checkups, there are plenty of things you can do on your own. Experts have done a lot of research about which lifestyle changes and preventive measures are most likely to keep you healthy. Ask your health care provider for more information. WEIGHT AND DIET  Eat a healthy diet  Be sure to include plenty of vegetables, fruits, low-fat dairy products, and lean protein.  Do not eat a lot of foods high in solid fats, added sugars, or salt.  Get regular exercise. This is one of the most important things you can do for your health.  Most adults should exercise for at least 150 minutes each week. The exercise should increase your heart rate and make you sweat (moderate-intensity exercise).  Most adults should also do strengthening exercises at least twice a week. This is in addition to the moderate-intensity exercise.  Maintain a healthy weight  Body mass index (BMI) is a  measurement that can be used to identify possible weight problems. It estimates body fat based on height and weight. Your health care provider can help determine your BMI and help you achieve or maintain a healthy weight.  For females 69 years of age and older:   A BMI below 18.5 is considered underweight.  A BMI of 18.5 to 24.9 is normal.  A BMI of 25 to 29.9 is considered overweight.  A BMI of 30 and above is considered obese.  Watch levels of cholesterol and blood lipids  You should start having your blood tested for lipids and cholesterol at 55 years of age, then have this test every 5 years.  You may need to have your cholesterol levels checked more often if:  Your lipid or cholesterol levels are high.  You are older than 55 years of age.  You are at high risk for heart disease.  CANCER SCREENING   Lung Cancer  Lung cancer screening is recommended for adults 108-33 years old who are at high risk for lung cancer because of a history of smoking.  A yearly low-dose CT scan of the lungs is recommended for people who:  Currently smoke.  Have quit within the past 15 years.  Have at  least a 30-pack-year history of smoking. A pack year is smoking an average of one pack of cigarettes a day for 1 year.  Yearly screening should continue until it has been 15 years since you quit.  Yearly screening should stop if you develop a health problem that would prevent you from having lung cancer treatment.  Breast Cancer  Practice breast self-awareness. This means understanding how your breasts normally appear and feel.  It also means doing regular breast self-exams. Let your health care provider know about any changes, no matter how small.  If you are in your 20s or 30s, you should have a clinical breast exam (CBE) by a health care provider every 1-3 years as part of a regular health exam.  If you are 41 or older, have a CBE every year. Also consider having a breast X-ray  (mammogram) every year.  If you have a family history of breast cancer, talk to your health care provider about genetic screening.  If you are at high risk for breast cancer, talk to your health care provider about having an MRI and a mammogram every year.  Breast cancer gene (BRCA) assessment is recommended for women who have family members with BRCA-related cancers. BRCA-related cancers include:  Breast.  Ovarian.  Tubal.  Peritoneal cancers.  Results of the assessment will determine the need for genetic counseling and BRCA1 and BRCA2 testing. Cervical Cancer Your health care provider may recommend that you be screened regularly for cancer of the pelvic organs (ovaries, uterus, and vagina). This screening involves a pelvic examination, including checking for microscopic changes to the surface of your cervix (Pap test). You may be encouraged to have this screening done every 3 years, beginning at age 55.  For women ages 37-65, health care providers may recommend pelvic exams and Pap testing every 3 years, or they may recommend the Pap and pelvic exam, combined with testing for human papilloma virus (HPV), every 5 years. Some types of HPV increase your risk of cervical cancer. Testing for HPV may also be done on women of any age with unclear Pap test results.  Other health care providers may not recommend any screening for nonpregnant women who are considered low risk for pelvic cancer and who do not have symptoms. Ask your health care provider if a screening pelvic exam is right for you.  If you have had past treatment for cervical cancer or a condition that could lead to cancer, you need Pap tests and screening for cancer for at least 20 years after your treatment. If Pap tests have been discontinued, your risk factors (such as having a new sexual partner) need to be reassessed to determine if screening should resume. Some women have medical problems that increase the chance of getting  cervical cancer. In these cases, your health care provider may recommend more frequent screening and Pap tests. Colorectal Cancer  This type of cancer can be detected and often prevented.  Routine colorectal cancer screening usually begins at 55 years of age and continues through 55 years of age.  Your health care provider may recommend screening at an earlier age if you have risk factors for colon cancer.  Your health care provider may also recommend using home test kits to check for hidden blood in the stool.  A small camera at the end of a tube can be used to examine your colon directly (sigmoidoscopy or colonoscopy). This is done to check for the earliest forms of colorectal cancer.  Routine screening  usually begins at age 55.  Direct examination of the colon should be repeated every 5-10 years through 55 years of age. However, you may need to be screened more often if early forms of precancerous polyps or small growths are found. Skin Cancer  Check your skin from head to toe regularly.  Tell your health care provider about any new moles or changes in moles, especially if there is a change in a mole's shape or color.  Also tell your health care provider if you have a mole that is larger than the size of a pencil eraser.  Always use sunscreen. Apply sunscreen liberally and repeatedly throughout the day.  Protect yourself by wearing long sleeves, pants, a wide-brimmed hat, and sunglasses whenever you are outside. HEART DISEASE, DIABETES, AND HIGH BLOOD PRESSURE   High blood pressure causes heart disease and increases the risk of stroke. High blood pressure is more likely to develop in:  People who have blood pressure in the high end of the normal range (130-139/85-89 mm Hg).  People who are overweight or obese.  People who are African American.  If you are 82-94 years of age, have your blood pressure checked every 3-5 years. If you are 93 years of age or older, have your blood  pressure checked every year. You should have your blood pressure measured twice--once when you are at a hospital or clinic, and once when you are not at a hospital or clinic. Record the average of the two measurements. To check your blood pressure when you are not at a hospital or clinic, you can use:  An automated blood pressure machine at a pharmacy.  A home blood pressure monitor.  If you are between 64 years and 45 years old, ask your health care provider if you should take aspirin to prevent strokes.  Have regular diabetes screenings. This involves taking a blood sample to check your fasting blood sugar level.  If you are at a normal weight and have a low risk for diabetes, have this test once every three years after 55 years of age.  If you are overweight and have a high risk for diabetes, consider being tested at a younger age or more often. PREVENTING INFECTION  Hepatitis B  If you have a higher risk for hepatitis B, you should be screened for this virus. You are considered at high risk for hepatitis B if:  You were born in a country where hepatitis B is common. Ask your health care provider which countries are considered high risk.  Your parents were born in a high-risk country, and you have not been immunized against hepatitis B (hepatitis B vaccine).  You have HIV or AIDS.  You use needles to inject street drugs.  You live with someone who has hepatitis B.  You have had sex with someone who has hepatitis B.  You get hemodialysis treatment.  You take certain medicines for conditions, including cancer, organ transplantation, and autoimmune conditions. Hepatitis C  Blood testing is recommended for:  Everyone born from [birth year] through 1965.  Anyone with known risk factors for hepatitis C. Sexually transmitted infections (STIs)  You should be screened for sexually transmitted infections (STIs) including gonorrhea and chlamydia if:  You are sexually active and are  younger than 55 years of age.  You are older than 55 years of age and your health care provider tells you that you are at risk for this type of infection.  Your sexual activity has changed since you  were last screened and you are at an increased risk for chlamydia or gonorrhea. Ask your health care provider if you are at risk.  If you do not have HIV, but are at risk, it may be recommended that you take a prescription medicine daily to prevent HIV infection. This is called pre-exposure prophylaxis (PrEP). You are considered at risk if:  You are sexually active and do not regularly use condoms or know the HIV status of your partner(s).  You take drugs by injection.  You are sexually active with a partner who has HIV. Talk with your health care provider about whether you are at high risk of being infected with HIV. If you choose to begin PrEP, you should first be tested for HIV. You should then be tested every 3 months for as long as you are taking PrEP.  PREGNANCY   If you are premenopausal and you may become pregnant, ask your health care provider about preconception counseling.  If you may become pregnant, take 400 to 800 micrograms (mcg) of folic acid every day.  If you want to prevent pregnancy, talk to your health care provider about birth control (contraception). OSTEOPOROSIS AND MENOPAUSE   Osteoporosis is a disease in which the bones lose minerals and strength with aging. This can result in serious bone fractures. Your risk for osteoporosis can be identified using a bone density scan.  If you are 19 years of age or older, or if you are at risk for osteoporosis and fractures, ask your health care provider if you should be screened.  Ask your health care provider whether you should take a calcium or vitamin D supplement to lower your risk for osteoporosis.  Menopause may have certain physical symptoms and risks.  Hormone replacement therapy may reduce some of these symptoms and  risks. Talk to your health care provider about whether hormone replacement therapy is right for you.  HOME CARE INSTRUCTIONS   Schedule regular health, dental, and eye exams.  Stay current with your immunizations.   Do not use any tobacco products including cigarettes, chewing tobacco, or electronic cigarettes.  If you are pregnant, do not drink alcohol.  If you are breastfeeding, limit how much and how often you drink alcohol.  Limit alcohol intake to no more than 1 drink per day for nonpregnant women. One drink equals 12 ounces of beer, 5 ounces of wine, or 1 ounces of hard liquor.  Do not use street drugs.  Do not share needles.  Ask your health care provider for help if you need support or information about quitting drugs.  Tell your health care provider if you often feel depressed.  Tell your health care provider if you have ever been abused or do not feel safe at home.   This information is not intended to replace advice given to you by your health care provider. Make sure you discuss any questions you have with your health care provider.   Document Released: 04/30/2011 Document Revised: 11/05/2014 Document Reviewed: 09/16/2013 Elsevier Interactive Patient Education 2016 [employer].  Smoking Hazards Smoking cigarettes is extremely bad for your health. Tobacco smoke has over 200 known poisons in it. It contains the poisonous gases nitrogen oxide and carbon monoxide. There are over 60 chemicals in tobacco smoke that cause cancer. Some of the chemicals found in cigarette smoke include:   Cyanide.   Benzene.   Formaldehyde.   Methanol (wood alcohol).   Acetylene (fuel used in welding torches).   Ammonia.  Even  smoking lightly shortens your life expectancy by several years. You can greatly reduce the risk of medical problems for you and your family by stopping now. Smoking is the most preventable cause of death and disease in our society. Within days of  quitting smoking, your circulation improves, you decrease the risk of having a heart attack, and your lung capacity improves. There may be some increased phlegm in the first few days after quitting, and it may take months for your lungs to clear up completely. Quitting for 10 years reduces your risk of developing lung cancer to almost that of a nonsmoker.  WHAT ARE THE RISKS OF SMOKING? Cigarette smokers have an increased risk of many serious medical problems, including:  Lung cancer.   Lung disease (such as pneumonia, bronchitis, and emphysema).   Heart attack and chest pain due to the heart not getting enough oxygen (angina).   Heart disease and peripheral blood vessel disease.   Hypertension.   Stroke.   Oral cancer (cancer of the lip, mouth, or voice box).   Bladder cancer.   Pancreatic cancer.   Cervical cancer.   Pregnancy complications, including premature birth.   Stillbirths and smaller newborn babies, birth defects, and genetic damage to sperm.   Early menopause.   Lower estrogen level for women.   Infertility.   Facial wrinkles.   Blindness.   Increased risk of broken bones (fractures).   Senile dementia.   Stomach ulcers and internal bleeding.   Delayed wound healing and increased risk of complications during surgery. Because of secondhand smoke exposure, children of smokers have an increased risk of the following:   Sudden infant death syndrome (SIDS).   Respiratory infections.   Lung cancer.   Heart disease.   Ear infections.  WHY IS SMOKING ADDICTIVE? Nicotine is the chemical agent in tobacco that is capable of causing addiction or dependence. When you smoke and inhale, nicotine is absorbed rapidly into the bloodstream through your lungs. Both inhaled and noninhaled nicotine may be addictive.  WHAT ARE THE BENEFITS OF QUITTING?  There are many health benefits to quitting smoking. Some are:   The likelihood of  developing cancer and heart disease decreases. Health improvements are seen almost immediately.   Blood pressure, pulse rate, and breathing patterns start returning to normal soon after quitting.   People who quit may see an improvement in their overall quality of life.  HOW DO YOU QUIT SMOKING? Smoking is an addiction with both physical and psychological effects, and longtime habits can be hard to change. Your health care provider can recommend:  Programs and community resources, which may include group support, education, or therapy.  Replacement products, such as patches, gum, and nasal sprays. Use these products only as directed. Do not replace cigarette smoking with electronic cigarettes (commonly called e-cigarettes). The safety of e-cigarettes is unknown, and some may contain harmful chemicals. FOR MORE INFORMATION  American Lung Association: www.lung.org  American Cancer Society: www.cancer.org   This information is not intended to replace advice given to you by your health care provider. Make sure you discuss any questions you have with your health care provider.   Document Released: 11/22/2004 Document Revised: 08/05/2013 Document Reviewed: 04/06/2013 Elsevier Interactive Patient Education 2016 [employer].  Steps to Quit Smoking  Smoking tobacco can be harmful to your health and can affect almost every organ in your body. Smoking puts you, and those around you, at risk for developing many serious chronic diseases. Quitting smoking is difficult, but it  is one of the best things that you can do for your health. It is never too late to quit. WHAT ARE THE BENEFITS OF QUITTING SMOKING? When you quit smoking, you lower your risk of developing serious diseases and conditions, such as:  Lung cancer or lung disease, such as COPD.  Heart disease.  Stroke.  Heart attack.  Infertility.  Osteoporosis and bone fractures. Additionally, symptoms such as coughing, wheezing, and  shortness of breath may get better when you quit. You may also find that you get sick less often because your body is stronger at fighting off colds and infections. If you are pregnant, quitting smoking can help to reduce your chances of having a baby of low birth weight. HOW DO I GET READY TO QUIT? When you decide to quit smoking, create a plan to make sure that you are successful. Before you quit:  Pick a date to quit. Set a date within the next two weeks to give you time to prepare.  Write down the reasons why you are quitting. Keep this list in places where you will see it often, such as on your bathroom mirror or in your car or wallet.  Identify the people, places, things, and activities that make you want to smoke (triggers) and avoid them. Make sure to take these actions:  Throw away all cigarettes at home, at work, and in your car.  Throw away smoking accessories, such as Scientist, research (medical).  Clean your car and make sure to empty the ashtray.  Clean your home, including curtains and carpets.  Tell your family, friends, and coworkers that you are quitting. Support from your loved ones can make quitting easier.  Talk with your health care provider about your options for quitting smoking.  Find out what treatment options are covered by your health insurance. WHAT STRATEGIES CAN I USE TO QUIT SMOKING?  Talk with your healthcare provider about different strategies to quit smoking. Some strategies include:  Quitting smoking altogether instead of gradually lessening how much you smoke over a period of time. Research shows that quitting "cold [place]" is more successful than gradually quitting.  Attending in-person counseling to help you build problem-solving skills. You are more likely to have success in quitting if you attend several counseling sessions. Even short sessions of 10 minutes can be effective.  Finding resources and support systems that can help you to quit smoking and  remain smoke-free after you quit. These resources are most helpful when you use them often. They can include:  Online chats with a Social worker.  Telephone quitlines.  Printed Furniture conservator/restorer.  Support groups or group counseling.  Text messaging programs.  Mobile phone applications.  Taking medicines to help you quit smoking. (If you are pregnant or breastfeeding, talk with your health care provider first.) Some medicines contain nicotine and some do not. Both types of medicines help with cravings, but the medicines that include nicotine help to relieve withdrawal symptoms. Your health care provider may recommend:  Nicotine patches, gum, or lozenges.  Nicotine inhalers or sprays.  Non-nicotine medicine that is taken by mouth. Talk with your health care provider about combining strategies, such as taking medicines while you are also receiving in-person counseling. Using these two strategies together makes you more likely to succeed in quitting than if you used either strategy on its own. If you are pregnant or breastfeeding, talk with your health care provider about finding counseling or other support strategies to quit smoking. Do not take medicine  to help you quit smoking unless told to do so by your health care provider. WHAT THINGS CAN I DO TO MAKE IT EASIER TO QUIT? Quitting smoking might feel overwhelming at first, but there is a lot that you can do to make it easier. Take these important actions:  Reach out to your family and friends and ask that they support and encourage you during this time. Call telephone quitlines, reach out to support groups, or work with a counselor for support.  Ask people who smoke to avoid smoking around you.  Avoid places that trigger you to smoke, such as bars, parties, or smoke-break areas at work.  Spend time around people who do not smoke.  Lessen stress in your life, because stress can be a smoking trigger for some people. To lessen stress,  try:  Exercising regularly.  Deep-breathing exercises.  Yoga.  Meditating.  Performing a body scan. This involves closing your eyes, scanning your body from head to toe, and noticing which parts of your body are particularly tense. Purposefully relax the muscles in those areas.  Download or purchase mobile phone or tablet apps (applications) that can help you stick to your quit plan by providing reminders, tips, and encouragement. There are many free apps, such as QuitGuide from the [employer] manager for Disease Control and Prevention). You can find other support for quitting smoking (smoking cessation) through smokefree.gov and other websites. HOW WILL I FEEL WHEN I QUIT SMOKING? Within the first 24 hours of quitting smoking, you may start to feel some withdrawal symptoms. These symptoms are usually most noticeable 2-3 days after quitting, but they usually do not last beyond 2-3 weeks. Changes or symptoms that you might experience include:  Mood swings.  Restlessness, anxiety, or irritation.  Difficulty concentrating.  Dizziness.  Strong cravings for sugary foods in addition to nicotine.  Mild weight gain.  Constipation.  Nausea.  Coughing or a sore throat.  Changes in how your medicines work in your body.  A depressed mood.  Difficulty sleeping (insomnia). After the first 2-3 weeks of quitting, you may start to notice more positive results, such as:  Improved sense of smell and taste.  Decreased coughing and sore throat.  Slower heart rate.  Lower blood pressure.  Clearer skin.  The ability to breathe more easily.  Fewer sick days. Quitting smoking is very challenging for most people. Do not get discouraged if you are not successful the first time. Some people need to make many attempts to quit before they achieve long-term success. Do your best to stick to your quit plan, and talk with your health care provider if you have any questions or concerns.   This  information is not intended to replace advice given to you by your health care provider. Make sure you discuss any questions you have with your health care provider.   Document Released: 10/09/2001 Document Revised: 03/01/2015 Document Reviewed: 03/01/2015 Elsevier Interactive Patient Education 2016 [employer].  Smoking Cessation, Tips for Success If you are ready to quit smoking, congratulations! You have chosen to help yourself be healthier. Cigarettes bring nicotine, tar, carbon monoxide, and other irritants into your body. Your lungs, heart, and blood vessels will be able to work better without these poisons. There are many different ways to quit smoking. Nicotine gum, nicotine patches, a nicotine inhaler, or nicotine nasal spray can help with physical craving. Hypnosis, support groups, and medicines help break the habit of smoking. WHAT THINGS CAN I DO TO MAKE QUITTING EASIER?  Here are some tips to help you quit for good:  Pick a date when you will quit smoking completely. Tell all of your friends and family about your plan to quit on that date.  Do not try to slowly cut down on the number of cigarettes you are smoking. Pick a quit date and quit smoking completely starting on that day.  Throw away all cigarettes.   Clean and remove all ashtrays from your home, work, and car.  On a card, write down your reasons for quitting. Carry the card with you and read it when you get the urge to smoke.  Cleanse your body of nicotine. Drink enough water and fluids to keep your urine clear or pale yellow. Do this after quitting to flush the nicotine from your body.  Learn to predict your moods. Do not let a bad situation be your excuse to have a cigarette. Some situations in your life might tempt you into wanting a cigarette.  Never have "just one" cigarette. It leads to wanting another and another. Remind yourself of your decision to quit.  Change habits associated with smoking. If you smoked  while driving or when feeling stressed, try other activities to replace smoking. Stand up when drinking your coffee. Brush your teeth after eating. Sit in a different chair when you read the paper. Avoid alcohol while trying to quit, and try to drink fewer caffeinated beverages. Alcohol and caffeine may urge you to smoke.  Avoid foods and drinks that can trigger a desire to smoke, such as sugary or spicy foods and alcohol.  Ask people who smoke not to smoke around you.  Have something planned to do right after eating or having a cup of coffee. For example, plan to take a walk or exercise.  Try a relaxation exercise to calm you down and decrease your stress. Remember, you may be tense and nervous for the first 2 weeks after you quit, but this will pass.  Find new activities to keep your hands busy. Play with a pen, coin, or rubber band. Doodle or draw things on paper.  Brush your teeth right after eating. This will help cut down on the craving for the taste of tobacco after meals. You can also try mouthwash.   Use oral substitutes in place of cigarettes. Try using lemon drops, carrots, cinnamon sticks, or chewing gum. Keep them handy so they are available when you have the urge to smoke.  When you have the urge to smoke, try deep breathing.  Designate your home as a nonsmoking area.  If you are a heavy smoker, ask your health care provider about a prescription for nicotine chewing gum. It can ease your withdrawal from nicotine.  Reward yourself. Set aside the cigarette money you save and buy yourself something nice.  Look for support from others. Join a support group or smoking cessation program. Ask someone at home or at work to help you with your plan to quit smoking.  Always ask yourself, "Do I need this cigarette or is this just a reflex?" Tell yourself, "Today, I choose not to smoke," or "I do not want to smoke." You are reminding yourself of your decision to quit.  Do not replace  cigarette smoking with electronic cigarettes (commonly called e-cigarettes). The safety of e-cigarettes is unknown, and some may contain harmful chemicals.  If you relapse, do not give up! Plan ahead and think about what you will do the next time you get the urge to smoke.  HOW WILL I FEEL WHEN I QUIT SMOKING? You may have symptoms of withdrawal because your body is used to nicotine (the addictive substance in cigarettes). You may crave cigarettes, be irritable, feel very hungry, cough often, get headaches, or have difficulty concentrating. The withdrawal symptoms are only temporary. They are strongest when you first quit but will go away within 10-14 days. When withdrawal symptoms occur, stay in control. Think about your reasons for quitting. Remind yourself that these are signs that your body is healing and getting used to being without cigarettes. Remember that withdrawal symptoms are easier to treat than the major diseases that smoking can cause.  Even after the withdrawal is over, expect periodic urges to smoke. However, these cravings are generally short lived and will go away whether you smoke or not. Do not smoke! WHAT RESOURCES ARE AVAILABLE TO HELP ME QUIT SMOKING? Your health care provider can direct you to community resources or hospitals for support, which may include:  Group support.  Education.  Hypnosis.  Therapy.   This information is not intended to replace advice given to you by your health care provider. Make sure you discuss any questions you have with your health care provider.   Document Released: 07/13/2004 Document Revised: 11/05/2014 Document Reviewed: 04/02/2013 Elsevier Interactive Patient Education [employer].

## 2016-03-22 NOTE — Assessment & Plan Note (Signed)
Reviewed and updated patient's medical, surgical, family and social history. Medications and allergies were also reviewed. Basic screenings for depression, activities of daily living, hearing, cognition and safety were performed. Provider list was updated and health plan was provided to the patient.  

## 2016-03-22 NOTE — Assessment & Plan Note (Signed)
Recommend weight loss of approximately 5-10% of current body weight through nutrition and physical activity.Recommend increasing physical activity to 30 minutes of moderate level activity daily. Encourage nutritional intake that focuses on nutrient dense foods and is moderate, varied, and balanced and is low in saturated fats and processed/sugary foods.

## 2016-03-22 NOTE — Assessment & Plan Note (Signed)
1) Anticipatory Guidance: Discussed importance of wearing a seatbelt while driving and not texting while driving; changing batteries in smoke detector at least once annually; wearing suntan lotion when outside; eating a balanced and moderate diet; getting physical activity at least 30 minutes per day.  2) Immunizations / Screenings / Labs:  All immunizations are up-to-date per recommendations.Obtain hepatitis C antibody for hepatitis C screening. Mammogram completed today. Obtain vitamin D for vitamin D deficiency screening. All other screenings are up-to-date per recommendations. Obtain CBC, CMET, and Lipid profile.  Overall well exam with risk factors for cardiovascular disease including tobacco use and sedentary lifestyle. Recommend weight loss approximate 5-10% of current body weight through nutrition and physical activity. Recommend increasing physical activity to 30 minutes of moderate level activity daily. Encourage nutritional intake that focuses on nutrient dense foods and is moderate, varied, and balanced and is low in saturated fats and processed/sugary foods. Discussed importance of tobacco cessation for reduction of cardiovascular and respiratory disease in the future. Patient is not ready to quit smoking at this time. Continue to monitor. Continue other healthy lifestyle behaviors and choices. Follow-up prevention exam in 1 year. Follow-up office visit pending blood work as needed for chronic conditions.

## 2016-03-22 NOTE — Assessment & Plan Note (Signed)
Continues to smoke. Discussed importance of cessation in prevention of future cardiovascular and respiratory diseases. Not ready to quite smoking at present, however is beginning to contemplate.

## 2016-03-23 ENCOUNTER — Telehealth: Payer: Self-pay | Admitting: Family

## 2016-03-23 LAB — VITAMIN D 25 HYDROXY (VIT D DEFICIENCY, FRACTURES): VITD: 13.19 ng/mL — ABNORMAL LOW (ref 30.00–100.00)

## 2016-03-23 LAB — HEPATITIS C ANTIBODY: HCV Ab: NEGATIVE

## 2016-03-23 NOTE — Telephone Encounter (Signed)
Patient called back in regards.  °

## 2016-03-23 NOTE — Telephone Encounter (Signed)
LVM for pt to call back.

## 2016-03-23 NOTE — Telephone Encounter (Signed)
Returned call. No answer. Left another VM

## 2016-03-23 NOTE — Telephone Encounter (Signed)
Please inform patient that her blood work shows that her Vitamin D is low and I have sent in a prescription to help supplement this. Her other blood work shows that her kidney function, liver function, electrolytes, and white/red blood cells are within the normal limits. Lastly her LDL or bad cholesterol is slightly elevated at 112 with a goal of less than 100.  With these numbers combined with other risk factors your calculated risk for coronary artery disease event in the next 10 years is 9%. Current recommendations suggest starting cholesterol medication above 7.5%. Therefore my recommendation is going to be starting a cholesterol medication to help reduce her risk for coronary artery disease in the future. This can also be accomplished through nutritional intake that is low in saturated fats and processed/sugary foods. Aerobic exercise has also been shown to reduce cholesterol. If you're interested also in a prescription for pravastatin.

## 2016-03-28 NOTE — Telephone Encounter (Signed)
LVM for pt to call back.

## 2016-04-17 ENCOUNTER — Other Ambulatory Visit: Payer: Self-pay

## 2016-04-17 ENCOUNTER — Other Ambulatory Visit: Payer: Self-pay | Admitting: Family

## 2016-04-17 MED ORDER — CHOLECALCIFEROL 1.25 MG (50000 UT) PO TABS: 50000.0000 [IU] | ORAL_TABLET | ORAL | Status: DC

## 2016-04-17 MED ORDER — ALPRAZOLAM 0.5 MG PO TABS: ORAL_TABLET | ORAL | Status: DC

## 2016-06-28 ENCOUNTER — Ambulatory Visit (HOSPITAL_COMMUNITY): Payer: Medicare Other | Admitting: Physical Therapy

## 2016-07-05 ENCOUNTER — Telehealth (HOSPITAL_COMMUNITY): Payer: Self-pay

## 2016-07-05 ENCOUNTER — Ambulatory Visit (HOSPITAL_COMMUNITY): Payer: Medicare Other | Admitting: Physical Therapy

## 2016-07-05 NOTE — Telephone Encounter (Signed)
07/05/16 left a message to reschedule evaluation

## 2016-07-12 ENCOUNTER — Encounter (HOSPITAL_COMMUNITY): Payer: Self-pay | Admitting: Physical Therapy

## 2016-07-12 ENCOUNTER — Ambulatory Visit (HOSPITAL_COMMUNITY): Payer: Medicare Other | Attending: Family | Admitting: Physical Therapy

## 2016-07-12 DIAGNOSIS — M542 Cervicalgia: Secondary | ICD-10-CM | POA: Insufficient documentation

## 2016-07-12 DIAGNOSIS — M6281 Muscle weakness (generalized): Secondary | ICD-10-CM

## 2016-07-12 DIAGNOSIS — R2689 Other abnormalities of gait and mobility: Secondary | ICD-10-CM

## 2016-07-12 DIAGNOSIS — M545 Low back pain: Secondary | ICD-10-CM | POA: Diagnosis present

## 2016-07-12 DIAGNOSIS — R29898 Other symptoms and signs involving the musculoskeletal system: Secondary | ICD-10-CM | POA: Diagnosis present

## 2016-07-12 DIAGNOSIS — R293 Abnormal posture: Secondary | ICD-10-CM | POA: Diagnosis present

## 2016-07-12 NOTE — Therapy (Signed)
[facility] [address] [place], [place], [address] Phone: [phone]   Fax:  [phone]  Physical Therapy Evaluation  Patient Details  Name: [name] MRN: [id number] Date of Birth: [birth year] Referring Provider: Corinna Capra, MD  Encounter Date: 07/12/2016      PT End of Session - 07/12/16 1350    Visit Number 1   Number of Visits 12   Date for PT Re-Evaluation 08/02/16   Authorization Type [employer] Medicare   Authorization Time Period 07/11/16 to 08/23/16   PT Start Time 0947   PT Stop Time 1030   PT Time Calculation (min) 43 min   Activity Tolerance Patient tolerated treatment well   Behavior During Therapy [place] for tasks assessed/performed      Past Medical History:  Diagnosis Date  . Chest pain   . Depression   . DJD (degenerative joint disease)   . GERD (gastroesophageal reflux disease)   . History of palpitations   . Migraine headache   . PVC's (premature ventricular contractions)   . 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 BACK SURGERY  10/23/12  . CHOLECYSTECTOMY    . LAMINECTOMY    . MRI     every 3 mths /due to menigitis  . TOTAL ABDOMINAL HYSTERECTOMY    . TUBAL LIGATION      There were no vitals filed for this visit.       Subjective Assessment - 07/12/16 0954    Subjective Pt reports having a significant history of back and neck pain. She had OPPT 3 years ago for the same issue and seemed to help a little. She didn't keep up with any of the exercises. She noticed the pain has continued to increase in her neck and back and was encouraged to come back to PT to address everything. She reports recent numbness in the tips of her Lt finger which continues to linger now more than anything.    Pertinent History Depression, DJD, GERD, PVC's, back surgery (2013), Cervical laminectomy and fusion.   How long can you sit comfortably? unlimited as long as she  maintains correct technique   How long can you stand comfortably? unlimited but more issue with this than walking    How long can you walk comfortably? unlimited    Patient Stated Goals hopes to establish program to perform at home for [facility] and avoid future surgery   Currently in Pain? Yes   Pain Score 6    Pain Location Other (Comment)  mid low back and Lt/Rt low neck   Pain Orientation Right;Left   Pain Descriptors / Indicators Other (Comment)  back: dull/burning; neck: nagging/aching   Pain Radiating Towards Neck: runs down clavicle/upper trap; Leg: either LE side of thigh to knee   Pain Onset More than a month ago   Pain Frequency Intermittent   Aggravating Factors  Neck: sitting up too long, weather; Back: sitting a long time   Pain Relieving Factors heat, elevating legs    Effect of Pain on Daily Activities not limited             [facility] PT Assessment - 07/12/16 0001      Assessment   Medical Diagnosis Cervical/Lumbar pain    Referring Provider Corinna Capra, MD   Onset Date/Surgical Date --  several years ago    Hand Dominance Right   Next MD Visit October    Prior  Therapy 3 years ago OPPT     Precautions   Precautions None     Restrictions   Weight Bearing Restrictions No     Balance Screen   Has the patient fallen in the past 6 months Yes   How many times? 1 time: fishing, fell off back of truck    Has the patient had a decrease in activity level because of a fear of falling?  No   Is the patient reluctant to leave their home because of a fear of falling?  No     Home Ec[employer] residence   Additional Comments 0 STE     Prior Function   Level of Independence Independent   Vocation Retired   Leisure Careers adviser; grandkids, 57yo lives with her      Cognition   Overall Cognitive Status Within Functional Limits for tasks assessed     Observation/Other Assessments   Focus on Therapeutic Outcomes (FOTO)  47%  limited      Sensation   Additional Comments Pt reporting numbness along tips of all fingers in Lt hand      Posture/Postural Control   Posture/Postural Control Postural limitations   Postural Limitations Forward head;Rounded Shoulders;Decreased lumbar lordosis     ROM / Strength   AROM / PROM / Strength AROM;Strength     AROM   AROM Assessment Site Cervical;Lumbar   Cervical Flexion 25   Cervical Extension 20   Cervical - Right Side Bend pain Rt cervical/upper trap   Cervical - Left Side Bend pain free   Cervical - Right Rotation pain Lt upper trap   Cervical - Left Rotation pain free   Lumbar Flexion WNL, pull back of calves   Lumbar Extension WNL, just stiffness   Lumbar - Right Side Bend knee jt line, pain Lt lumbar    Lumbar - Left Side Bend knee jt line, pain free   Lumbar - Right Rotation WNL, pain Lt lumbar    Lumbar - Left Rotation WNL, pain free     Strength   Strength Assessment Site Cervical;Hip;Knee;Ankle   Right/Left Hip Right;Left   Right Hip Flexion 4-/5   Right Hip Extension 4-/5   Right Hip ABduction 4-/5   Left Hip Flexion 5/5   Left Hip Extension 4-/5   Left Hip ABduction 4-/5   Right/Left Knee Right;Left   Right Knee Flexion 4-/5   Right Knee Extension 4-/5   Left Knee Flexion 5/5   Left Knee Extension 5/5   Right/Left Ankle Right;Left   Right Ankle Dorsiflexion 5/5   Left Ankle Dorsiflexion 5/5   Cervical Flexion 3+/5   Cervical Extension 3+/5   Cervical - Right Side Bend 3+/5   Cervical - Left Side Bend 3+/5   Cervical - Right Rotation 3+/5   Cervical - Left Rotation 3+/5     Flexibility   Soft Tissue Assessment /Muscle Length yes   Hamstrings Rt; 35 deg, Lt; 10 deg      Palpation   Palpation comment Rt QL, B lumbar paraspinals, Rt hamstring (lateral); Cervical; B upper trap,                            PT Education - 07/12/16 1348    Education provided Yes   Education Details eval findings/POC; use of lumbar roll  to address pain with prolonged sitting; deferred HEP due to time constraints; encouraged pt to refrain from wearing  cervical collar   Person(s) Educated Patient   Methods Explanation;Demonstration;Verbal cues   Comprehension Verbalized understanding;Returned demonstration          PT Short Term Goals - 07/12/16 1400      PT SHORT TERM GOAL #1   Title Pt will demo consistency and independence with her HEP to improve pain and mobility.    Time 2   Period Weeks   Status New     PT SHORT TERM GOAL #2   Title Pt will demo improved postural awareness evident by her ability to maintian upright posture atleast 50% of the sesison without cuing.    Time 3   Period Weeks   Status New           PT Long Term Goals - 07/12/16 1401      PT LONG TERM GOAL #1   Title Pt will report decreased tenderness with palation along upper trap, sub occipitals and cervical region to improve her tolerance to activity.    Time 6   Period Weeks   Status New     PT LONG TERM GOAL #2   Title Pt will demo pain free cervical ROM into Rt lateral flexion and Rt rotation to improve comfort with driving and other activitiy.    Time 6   Period Weeks   Status New     PT LONG TERM GOAL #3   Title Pt will report no more than 3/10 pain in her cervical region to allow her to tolerate a busy day with her granddaughter.    Time 6   Period Weeks   Status New     PT LONG TERM GOAL #4   Title Pt will demo correct log roll technique when transitioning in/out of bed to prevent excess strain on her spine, x5 trials, without cues from therapist.    Time 6   Period Weeks   Status New               Plan - 07/12/16 1350    Clinical Impression Statement Pt is a 56yo F referred to OPPT concerning ongoing cervical and lumbar pain. She has an extensive PMH of cervical fusions and lumbar "exploratory surgery" for which she has received PT in the past with some relief. She demonstrates limitations in cervical/trunk  strength, as well as pain with AROM in both the cervical and lumbar spine, limited BLE strength and tenderness with palpation along upper trap, lumbar paraspinals and other surrounding musculature. Due to the extent of her issues and concerns, I encouraged pt to address one area at this time so that she can receive more consistent care before addressing her other area of concern. We will be focusing primarily on her cervical pain/posture and transition to lumbar pain at a future date. Pt would benefit from skilled PT to address her pain and limitations in strength, flexibility, and mobility to improve her quality of life. I reviewed use of lumbar roll this visit to address sitting posture, with which she reports increased cervical pain. She demonstrated understanding at this time.   Rehab Potential Good   PT Frequency 2x / week   PT Duration 6 weeks   PT Next Visit Plan Further assess: UE strength; STM/manual to address trigger points/spasm in upper traps/levator/sub occipitals for pain relief, Thoracic mobility (ext/flex), chin tucks   PT Home Exercise Plan lumbar roll   Consulted and Agree with Plan of Care Patient      Patient will benefit from skilled  therapeutic intervention in order to improve the following deficits and impairments:  Decreased activity tolerance, Decreased mobility, Hypomobility, Increased muscle spasms, Impaired sensation, Pain, Postural dysfunction, Impaired flexibility, Improper body mechanics, Decreased strength, Decreased range of motion  Visit Diagnosis: Cervicalgia  Bilateral low back pain, with sciatica presence unspecified  Muscle weakness (generalized)  Other symptoms and signs involving the musculoskeletal system  Other abnormalities of gait and mobility  Abnormal posture      G-Codes - August 07, 2016 1405    Functional Assessment Tool Used FOTO: 47% limited   Functional Limitation Mobility: Walking and moving around   Mobility: Walking and Moving Around  Current Status [phone]) At least 40 percent but less than 60 percent impaired, limited or restricted   Mobility: Walking and Moving Around Goal Status [phone]) At least 20 percent but less than 40 percent impaired, limited or restricted       Problem List Patient Active Problem List   Diagnosis Date Noted  . Medicare annual wellness visit, subsequent 03/22/2016  . Overweight (BMI 25.0-29.9) 03/22/2016  . Sinusitis, chronic 03/17/2015  . Preventative health care 09/09/2014  . Vaginitis and vulvovaginitis 08/20/2014  . Meningitis, candidal 11/25/2012  . Tobacco abuse 11/25/2012  . Anxiety state 04/13/2010  . PREMATURE VENTRICULAR CONTRACTIONS 02/06/2010  . ELECTROCARDIOGRAM, ABNORMAL 02/06/2010  . Acute sinusitis 10/20/2008  . MIGRAINE HEADACHE 07/24/2007  . GASTROESOPHAGEAL REFLUX DISEASE 07/24/2007  . 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 CHOLECYSTECTOMY, HX OF 07/24/2007    2:09 PM,08-07-16 Elly Modena PT, DPT [facility] [address] [place], [place], [address] Phone: [phone]   Fax:  [phone]  Name: [name] MRN: [id number] Date of Birth: [birth year]

## 2016-07-16 ENCOUNTER — Ambulatory Visit (HOSPITAL_COMMUNITY): Payer: Medicare Other | Admitting: Physical Therapy

## 2016-07-16 ENCOUNTER — Telehealth (HOSPITAL_COMMUNITY): Payer: Self-pay | Admitting: Physical Therapy

## 2016-07-16 NOTE — Telephone Encounter (Signed)
No Show- Pt apologizing and stating she forgot about her appt. Reminded of next appt 07/19/16 at 1pm, she confirmed she will be there.   6:29 PM,07/16/16 Elly Modena PT, [facility] [phone]

## 2016-07-19 ENCOUNTER — Ambulatory Visit (HOSPITAL_COMMUNITY): Payer: Medicare Other

## 2016-07-19 ENCOUNTER — Telehealth (HOSPITAL_COMMUNITY): Payer: Self-pay

## 2016-07-19 NOTE — Telephone Encounter (Signed)
Pt called to cancel today's PT appointment. No reason given.  Ailene Ravel, OTR/L,CBIS  [phone]

## 2016-07-23 ENCOUNTER — Ambulatory Visit (HOSPITAL_COMMUNITY): Payer: Medicare Other | Admitting: Physical Therapy

## 2016-07-23 ENCOUNTER — Telehealth (HOSPITAL_COMMUNITY): Payer: Self-pay | Admitting: Physical Therapy

## 2016-07-23 NOTE — Telephone Encounter (Signed)
No Show: Spoke with pt who states she forgot her appt today. She apologized and confirmed she will be at her next appt on 07/26/16 at 11:15am.  5:57 PM,07/23/16 Elly Modena PT, DPT [facility] [phone]

## 2016-07-26 ENCOUNTER — Ambulatory Visit (HOSPITAL_COMMUNITY): Payer: Medicare Other

## 2016-07-26 DIAGNOSIS — M6281 Muscle weakness (generalized): Secondary | ICD-10-CM

## 2016-07-26 DIAGNOSIS — R293 Abnormal posture: Secondary | ICD-10-CM

## 2016-07-26 DIAGNOSIS — R29898 Other symptoms and signs involving the musculoskeletal system: Secondary | ICD-10-CM

## 2016-07-26 DIAGNOSIS — M542 Cervicalgia: Secondary | ICD-10-CM | POA: Diagnosis not present

## 2016-07-26 DIAGNOSIS — R2689 Other abnormalities of gait and mobility: Secondary | ICD-10-CM

## 2016-07-26 DIAGNOSIS — M545 Low back pain: Secondary | ICD-10-CM

## 2016-07-26 NOTE — Therapy (Signed)
[place], [place], [address] Phone: [phone]   Fax:  [phone]  Physical Therapy Treatment  Patient Details  Name: [name] MRN: [id number] Date of Birth: 1961-04-18 Referring Provider: Corinna Capra, MD  Encounter Date: 07/26/2016      PT End of Session - 07/26/16 1236    Visit Number 2   Number of Visits 12   Date for PT Re-Evaluation 08/02/16   Authorization Type [employer] Medicare   Authorization Time Period 07/12/16 to 08/23/16   Authorization - Visit Number 2   Authorization - Number of Visits 10   PT Start Time 1117   PT Stop Time 1207   PT Time Calculation (min) 50 min   Activity Tolerance Patient tolerated treatment well   Behavior During Therapy [facility] for tasks assessed/performed      Past Medical History:  Diagnosis Date  . Chest pain   . Depression   . DJD (degenerative joint disease)   . GERD (gastroesophageal reflux disease)   . History of palpitations   . Migraine headache   . PVC's (premature ventricular contractions)   . RVOT ventricular tachycardia [facility])     Past Surgical History:  Procedure Laterality Date  . BACK SURGERY  10/23/12  . CHOLECYSTECTOMY    . LAMINECTOMY    . MRI     every 3 mths /due to menigitis  . TOTAL ABDOMINAL HYSTERECTOMY    . TUBAL LIGATION      There were no vitals filed for this visit.      Subjective Assessment - 07/26/16 1119    Subjective Pt reports no major changes sicne last visit. No major exacerbation in pain. Symptoms have been somewhat stable as she paces herself.    Pertinent History Depression, DJD, GERD, PVC's, back surgery (2013), complete cervical C1-C7 laminectomy and fusion.   Currently in Pain? Yes   Pain Score 5    Pain Orientation Right   Pain Radiating Towards R upper Trap, some intermittent shotting Left occipital nerve pain.    Pain Onset Other (comment)   Pain Frequency Constant                          [facility] Adult PT Treatment/Exercise - 07/26/16 0001      Exercises   Exercises Neck     Neck Exercises: Seated   Other Seated Exercise Scapular retraction 15x3sH (HEP)  Scapular depression 15x3sH (HEP)      Neck Exercises: Supine   Other Supine Exercise serratus punch: 15x3secondH  thoracic towel roll stretch at T6 (concurrent with other MT)     Modalities   Modalities Moist Heat     Moist Heat Therapy   Moist Heat Location Cervical  suboccipitals     Manual Therapy   Manual Therapy Scapular mobilization;Myofascial release   Manual therapy comments Pec minor release 2 minutes each, bilat   Scapular Mobilization 2x60sec stretching bilat:   depresssion, posterior tilt, protraction.                 PT Education - 07/26/16 1235    Education provided Yes   Education Details explained how scapulothoracic mobility is ciritcal in UE movement.    Person(s) Educated Patient   Methods Explanation;Demonstration   Comprehension Verbalized understanding;Returned demonstration          PT Short Term Goals - 07/12/16 1400      PT SHORT TERM GOAL #1  Title Pt will demo consistency and independence with her HEP to improve pain and mobility.    Time 2   Period Weeks   Status New     PT SHORT TERM GOAL #2   Title Pt will demo improved postural awareness evident by her ability to maintian upright posture atleast 50% of the sesison without cuing.    Time 3   Period Weeks   Status New           PT Long Term Goals - 07/12/16 1401      PT LONG TERM GOAL #1   Title Pt will report decreased tenderness with palation along upper trap, sub occipitals and cervical region to improve her tolerance to activity.    Time 6   Period Weeks   Status New     PT LONG TERM GOAL #2   Title Pt will demo pain free cervical ROM into Rt lateral flexion and Rt rotation to improve comfort with driving and other activitiy.    Time 6   Period Weeks   Status New     PT LONG TERM GOAL #3    Title Pt will report no more than 3/10 pain in her cervical region to allow her to tolerate a busy day with her granddaughter.    Time 6   Period Weeks   Status New     PT LONG TERM GOAL #4   Title Pt will demo correct log roll technique when transitioning in/out of bed to prevent excess strain on her spine, x5 trials, without cues from therapist.    Time 6   Period Weeks   Status New               Plan - 07/26/16 1238    Clinical Impression Statement Today's session focusing on imrpoving scapular mobility to relieve tension on neck and head. Pt responded well to treatment with a 20 degrees increase in shoulder flexion range bilaterally after stretches and improved pain free range of motion. HEP is established and goals are discussed. Pt making progress toward goals.    Rehab Potential Good   PT Frequency 2x / week   PT Duration 6 weeks   PT Next Visit Plan Review HEP: STM/manual to Rt upper traps/levator/sub occipitals for improved scapulo thoracic mobility. Continue thoracic extension/rotation mobility; cervical isometrics for pain relief.    PT Home Exercise Plan eval: lumbar roll; 9/28-> scap depression, scap restraction, T-spine roll, prayer stretch, doorway pec minor stretch    Consulted and Agree with Plan of Care Patient      Patient will benefit from skilled therapeutic intervention in order to improve the following deficits and impairments:  Decreased activity tolerance, Decreased mobility, Hypomobility, Increased muscle spasms, Impaired sensation, Pain, Postural dysfunction, Impaired flexibility, Improper body mechanics, Decreased strength, Decreased range of motion  Visit Diagnosis: Cervicalgia  Bilateral low back pain, with sciatica presence unspecified  Muscle weakness (generalized)  Other symptoms and signs involving the musculoskeletal system  Other abnormalities of gait and mobility  Abnormal posture     Problem List Patient Active Problem List    Diagnosis Date Noted  . Medicare annual wellness visit, subsequent 03/22/2016  . Overweight (BMI 25.0-29.9) 03/22/2016  . Sinusitis, chronic 03/17/2015  . Preventative health care 09/09/2014  . Vaginitis and vulvovaginitis 08/20/2014  . Meningitis, candidal 11/25/2012  . Tobacco abuse 11/25/2012  . Anxiety state 04/13/2010  . PREMATURE VENTRICULAR CONTRACTIONS 02/06/2010  . ELECTROCARDIOGRAM, ABNORMAL 02/06/2010  . Acute sinusitis 10/20/2008  .  MIGRAINE HEADACHE 07/24/2007  . GASTROESOPHAGEAL REFLUX DISEASE 07/24/2007  . 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 CHOLECYSTECTOMY, HX OF 07/24/2007    12:46 PM, 07/26/16 Etta Grandchild, PT, DPT Physical Therapist at [facility] [phone] (office)     [place] [address] [place], [place], [address] Phone: [phone]   Fax:  [phone]  Name: [name] MRN: [id number] Date of Birth: Nov 14, 1960

## 2016-07-30 ENCOUNTER — Ambulatory Visit (HOSPITAL_COMMUNITY): Payer: Medicare Other | Attending: Family | Admitting: Physical Therapy

## 2016-07-30 DIAGNOSIS — M542 Cervicalgia: Secondary | ICD-10-CM | POA: Diagnosis present

## 2016-07-30 DIAGNOSIS — M6281 Muscle weakness (generalized): Secondary | ICD-10-CM | POA: Diagnosis present

## 2016-07-30 DIAGNOSIS — R2689 Other abnormalities of gait and mobility: Secondary | ICD-10-CM | POA: Diagnosis present

## 2016-07-30 DIAGNOSIS — R29898 Other symptoms and signs involving the musculoskeletal system: Secondary | ICD-10-CM | POA: Diagnosis present

## 2016-07-30 DIAGNOSIS — R293 Abnormal posture: Secondary | ICD-10-CM

## 2016-07-30 NOTE — Therapy (Addendum)
[place] [address] [place], [place], [address] Phone: [phone]   Fax:  [phone]  Physical Therapy Treatment/Discharge  Patient Details  Name: [name] MRN: [id number] Date of Birth: 10/28/1961 Referring Provider: Corinna Capra, MD  Encounter Date: 07/30/2016      PT End of Session - 07/30/16 1123    Visit Number 3   Number of Visits 12   Date for PT Re-Evaluation 08/02/16   Authorization Type [employer] Medicare   Authorization Time Period 07/12/16 to 08/23/16   Authorization - Visit Number 3   Authorization - Number of Visits 10   PT Start Time 1034   PT Stop Time 1115   PT Time Calculation (min) 41 min   Activity Tolerance Patient tolerated treatment well   Behavior During Therapy [facility] for tasks assessed/performed      Past Medical History:  Diagnosis Date  . Chest pain   . Depression   . DJD (degenerative joint disease)   . GERD (gastroesophageal reflux disease)   . History of palpitations   . Migraine headache   . PVC's (premature ventricular contractions)   . RVOT ventricular tachycardia [facility])     Past Surgical History:  Procedure Laterality Date  . BACK SURGERY  10/23/12  . CHOLECYSTECTOMY    . LAMINECTOMY    . MRI     every 3 mths /due to menigitis  . TOTAL ABDOMINAL HYSTERECTOMY    . TUBAL LIGATION      There were no vitals filed for this visit.      Subjective Assessment - 07/30/16 1037    Subjective Pt reports things are going well. She has no pain sitting here, but continues to have some pain with head movements. Overall, it is much improved since her inital evaluation. She performed her HEP at home this saturday.    Pertinent History Depression, DJD, GERD, PVC's, back surgery (2013), complete cervical C1-C7 laminectomy and fusion.   Currently in Pain? No/denies   Pain Onset Other (comment)                         [facility] Adult PT Treatment/Exercise - 07/30/16 0001      Neck  Exercises: Seated   Neck Retraction 15 reps;3 secs   Neck Retraction Limitations verbal cues for technique    Other Seated Exercise scap retraction with lumbar roll 20x3 sec hold thoracic extension mobilization x3 reps each from T6 to T1   Other Seated Exercise scap depression with lumbar roll 20x3 sec hold      Neck Exercises: Supine   Other Supine Exercise serratus punches with towel at mid thoracic x15 reps      Manual Therapy   Manual Therapy Muscle Energy Technique   Muscle Energy Technique Pec minor contract/relax stretch; 1st rib mobilization/MET x5 each      Neck Exercises: Stretches   Other Neck Stretches doorway stretch, pecs 2x30 sec each                 PT Education - 07/30/16 1118    Education provided Yes   Education Details implications for manual techniques; importance of proper postural alignment; updated HEP   Person(s) Educated Patient   Methods Explanation;Demonstration;Verbal cues;Handout   Comprehension Verbalized understanding;Returned demonstration          PT Short Term Goals - 07/12/16 1400      PT SHORT TERM GOAL #1   Title Pt will  demo consistency and independence with her HEP to improve pain and mobility.    Time 2   Period Weeks   Status New     PT SHORT TERM GOAL #2   Title Pt will demo improved postural awareness evident by her ability to maintian upright posture atleast 50% of the sesison without cuing.    Time 3   Period Weeks   Status New           PT Long Term Goals - 07/12/16 1401      PT LONG TERM GOAL #1   Title Pt will report decreased tenderness with palation along upper trap, sub occipitals and cervical region to improve her tolerance to activity.    Time 6   Period Weeks   Status New     PT LONG TERM GOAL #2   Title Pt will demo pain free cervical ROM into Rt lateral flexion and Rt rotation to improve comfort with driving and other activitiy.    Time 6   Period Weeks   Status New     PT LONG TERM GOAL #3    Title Pt will report no more than 3/10 pain in her cervical region to allow her to tolerate a busy day with her granddaughter.    Time 6   Period Weeks   Status New     PT LONG TERM GOAL #4   Title Pt will demo correct log roll technique when transitioning in/out of bed to prevent excess strain on her spine, x5 trials, without cues from therapist.    Time 6   Period Weeks   Status New               Plan - 07/30/16 1124    Clinical Impression Statement Today's session continued to focus on improving thoracic and cervical pain and mobility. Pt is making progress towards goals with decreased overall pain report and understanding of her HEP. Several exercises from her HEP were reviewed with only minor adjustments and pt reporting resolution of symptoms noticed at home. Manual treatment was also performed without increase in pain and pt reported improved overall mobility by the end of today's session. Will continue with current POC.   Rehab Potential Good   PT Frequency 2x / week   PT Duration 6 weeks   PT Treatment/Interventions ADLs/Self Care Home Management;Moist Heat;Functional mobility training;Therapeutic activities;Therapeutic exercise;Patient/family education;Neuromuscular re-education;Manual techniques;Taping;Dry needling;Passive range of motion   PT Next Visit Plan STM/manual to Rt upper traps/levator/sub occipitals for improved scapulo thoracic mobility. Continue thoracic extension/rotation mobility; cervical isometrics for pain relief.    PT Home Exercise Plan eval: lumbar roll; 9/28-> scap depression, scap restraction, T-spine roll, prayer stretch, doorway pec minor stretch    Consulted and Agree with Plan of Care Patient      Patient will benefit from skilled therapeutic intervention in order to improve the following deficits and impairments:  Decreased activity tolerance, Decreased mobility, Hypomobility, Increased muscle spasms, Impaired sensation, Pain, Postural  dysfunction, Impaired flexibility, Improper body mechanics, Decreased strength, Decreased range of motion  Visit Diagnosis: Cervicalgia  Muscle weakness (generalized)  Other symptoms and signs involving the musculoskeletal system  Other abnormalities of gait and mobility  Abnormal posture     Problem List Patient Active Problem List   Diagnosis Date Noted  . Medicare annual wellness visit, subsequent 03/22/2016  . Overweight (BMI 25.0-29.9) 03/22/2016  . Sinusitis, chronic 03/17/2015  . Preventative health care 09/09/2014  . Vaginitis and vulvovaginitis 08/20/2014  .  Meningitis, candidal 11/25/2012  . Tobacco abuse 11/25/2012  . Anxiety state 04/13/2010  . PREMATURE VENTRICULAR CONTRACTIONS 02/06/2010  . ELECTROCARDIOGRAM, ABNORMAL 02/06/2010  . Acute sinusitis 10/20/2008  . MIGRAINE HEADACHE 07/24/2007  . GASTROESOPHAGEAL REFLUX DISEASE 07/24/2007  . 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 CHOLECYSTECTOMY, HX OF 07/24/2007   11:32 AM,07/30/16 [name] PT, DPT [facility] [place] [address] [place], [place], [address] Phone: [phone]   Fax:  [phone]  Name: [name] MRN: [id number] Date of Birth: 1961-08-01  *Addendum to close episode of care and d/c pt from PT  [place]  Visits from Start of Care: 3  Current functional level related to goals / functional outcomes: See treatment note above for more details concerning most current functional level.   Remaining deficits: See treatment note above for more details concerning most current functional deficits.   Education / Equipment: HEP provided at eval and updated last visit. Importance of upright posture and improved awareness. Plan: Patient agrees to discharge.  Patient goals were not met. Patient is being discharged due to not returning since the last  visit.  ?????     Pt has not returned to PT since her last session. She is being discharged at this time due to poor attendance.   4:21 PM,11/08/16 [name] PT, [place] [facility] [phone]

## 2016-08-02 ENCOUNTER — Ambulatory Visit (HOSPITAL_COMMUNITY): Payer: Medicare Other | Admitting: Physical Therapy

## 2016-08-06 ENCOUNTER — Ambulatory Visit (HOSPITAL_COMMUNITY): Payer: Medicare Other | Admitting: Physical Therapy

## 2016-08-06 ENCOUNTER — Telehealth (HOSPITAL_COMMUNITY): Payer: Self-pay | Admitting: Physical Therapy

## 2016-08-06 NOTE — Telephone Encounter (Signed)
Pt did not show for appt.  Called and patient stated she was in MD office and that is why she couldn't make her appt.  States she also has order for lumbar therapy when she returns. PT is aware of her next appt on Thursday at 10:30 am. Teena Irani, PTA/CLT [phone]

## 2016-08-09 ENCOUNTER — Ambulatory Visit (HOSPITAL_COMMUNITY): Payer: Medicare Other | Admitting: Physical Therapy

## 2016-08-09 ENCOUNTER — Telehealth (HOSPITAL_COMMUNITY): Payer: Self-pay

## 2016-08-09 ENCOUNTER — Telehealth: Payer: Self-pay | Admitting: Gastroenterology

## 2016-08-09 NOTE — Telephone Encounter (Signed)
08/09/16 patient called to cx, I forgot to remove the appt.  She said she coulnd't come in today

## 2016-08-09 NOTE — Telephone Encounter (Signed)
Spoke with the patient. She is on chronic pain medication and has chronic constipation per the patient. Her pain management doctor gave her Linzess. She took Linzess last night. She has had a small bowel movement. Passing gas, but has waves of sharp pain. Offered an appointment for evaluation. Discussed how Linzess works and how to take it according to [employer]. Discussed OTC laxatives for purging such as Miralax or Mag. Citrate.  She denies bloody bowel movements, nausea, vomiting or fever.

## 2016-08-13 ENCOUNTER — Ambulatory Visit (HOSPITAL_COMMUNITY): Payer: Medicare Other | Admitting: Physical Therapy

## 2016-08-13 ENCOUNTER — Telehealth (HOSPITAL_COMMUNITY): Payer: Self-pay | Admitting: Physical Therapy

## 2016-08-13 NOTE — Telephone Encounter (Signed)
She is still sick and cannot come in

## 2016-08-16 ENCOUNTER — Telehealth (HOSPITAL_COMMUNITY): Payer: Self-pay | Admitting: Physical Therapy

## 2016-08-16 ENCOUNTER — Ambulatory Visit (HOSPITAL_COMMUNITY): Payer: Medicare Other | Admitting: Physical Therapy

## 2016-08-16 NOTE — Telephone Encounter (Signed)
She is still sick and will plan to be here on Monday

## 2016-08-20 ENCOUNTER — Ambulatory Visit (HOSPITAL_COMMUNITY): Payer: Medicare Other | Admitting: Physical Therapy

## 2016-08-21 ENCOUNTER — Telehealth (HOSPITAL_COMMUNITY): Payer: Self-pay | Admitting: Physical Therapy

## 2016-08-21 NOTE — Telephone Encounter (Signed)
No Show: [facility] reminding pt of missed appt yesterday. Notified of next appt on 08/23/16 at 10:30am and provided office #/encouraged her to call if unable to make appt.  4:39 PM,08/21/16 Elly Modena PT, [place] [facility] [phone]

## 2016-08-23 ENCOUNTER — Ambulatory Visit (HOSPITAL_COMMUNITY): Payer: Medicare Other | Admitting: Physical Therapy

## 2016-08-23 ENCOUNTER — Telehealth (HOSPITAL_COMMUNITY): Payer: Self-pay | Admitting: Physical Therapy

## 2016-08-23 NOTE — Telephone Encounter (Signed)
No Show #2: [facility] reminding pt of missed appt. She has no scheduled appts past today so I provided # to clinic for her to call and discuss etiher d/c from therapy or to be put back on the schedule.  2:32 PM,08/23/16 [place], [place] [facility] [phone]

## 2016-09-18 ENCOUNTER — Other Ambulatory Visit: Payer: Self-pay | Admitting: Family

## 2016-10-02 ENCOUNTER — Ambulatory Visit (INDEPENDENT_AMBULATORY_CARE_PROVIDER_SITE_OTHER): Payer: Medicare Other | Admitting: Family

## 2016-10-02 ENCOUNTER — Encounter: Payer: Self-pay | Admitting: Family

## 2016-10-02 VITALS — BP 110/86 | HR 75 | Temp 98.0°F | Resp 16 | Ht 66.0 in | Wt 173.0 lb

## 2016-10-02 DIAGNOSIS — J0140 Acute pansinusitis, unspecified: Secondary | ICD-10-CM | POA: Diagnosis not present

## 2016-10-02 MED ORDER — AMOXICILLIN-POT CLAVULANATE 875-125 MG PO TABS: 1.0000 | ORAL_TABLET | Freq: Two times a day (BID) | ORAL | 0 refills | Status: DC

## 2016-10-02 MED ORDER — METHYLPREDNISOLONE ACETATE 80 MG/ML IJ SUSP
80.0000 mg | Freq: Once | INTRAMUSCULAR | Status: AC
  Administered 2016-10-02: 80 mg via INTRAMUSCULAR

## 2016-10-02 NOTE — Assessment & Plan Note (Signed)
Symptoms and exam consistent with pansinusitis most likely bacterial. Start Augmentin. In office injection Depo-Medrol provided. Continue over-the-counter medications as needed for symptom relief and supportive care. Follow-up if symptoms worsen or do not improve.

## 2016-10-02 NOTE — Progress Notes (Signed)
Subjective:    Patient ID: [name], female    DOB: [birth year], 55 y.o.   MRN: [id number]  Chief Complaint  Patient presents with  . Nasal Congestion    has sinus congestion, weak, cough x3 weeks    HPI:  [name] is a 55 y.o. female who  has a past medical history of Chest pain; Depression; DJD (degenerative joint disease); GERD (gastroesophageal reflux disease); History of palpitations; Migraine headache; PVC's (premature ventricular contractions); and RVOT ventricular tachycardia ([place]). and presents today for an acute office visit.   This is a new problem. Associated symptoms of sinus congestion, weak and cough that have been going on for about 3 weeks. Denies fevers. Modifying factors include Sudafed which has not helped very much. Timing of the symptoms is generally consistent. No recent antibiotics. No sick contacts. Symptoms generally are worse since initial onset.   No Known Allergies    Outpatient Medications Prior to Visit  Medication Sig Dispense Refill  . ALPRAZolam (XANAX) 0.5 MG tablet take 1 tablet by mouth at bedtime if needed 30 tablet 0  . Cholecalciferol 50000 units TABS Take 50,000 Units by mouth once a week. 12 tablet 0  . cyclobenzaprine (FLEXERIL) 5 MG tablet Take 5 mg by mouth 2 (two) times daily as needed for muscle spasms.     Marland Kitchen HYDROcodone-acetaminophen (NORCO) 7.5-325 MG tablet Take 1 tablet by mouth every 6 (six) hours as needed for moderate pain.    . ranitidine (ZANTAC) 150 MG tablet take 1 tablet by mouth twice a day 60 tablet 11   No facility-administered medications prior to visit.     Review of Systems  Constitutional: Negative for chills and fever.  HENT: Positive for congestion, sinus pain, sinus pressure and sore throat. Negative for ear pain.   Respiratory: Positive for cough. Negative for chest tightness and wheezing.   Neurological: Positive for headaches.      Objective:    BP 110/86 (BP Location: Left Arm, Patient  Position: Sitting, Cuff Size: Normal)   Pulse 75   Temp 98 F (36.7 C) (Oral)   Resp 16   Ht 5\' 6"  (1.676 m)   Wt 173 lb (78.5 kg)   SpO2 96%   BMI 27.92 kg/m  Nursing note and vital signs reviewed.  Physical Exam  Constitutional: She is oriented to person, place, and time. She appears well-developed and well-nourished.  HENT:  Right Ear: Hearing, tympanic membrane, external ear and ear canal normal.  Left Ear: Hearing, tympanic membrane, external ear and ear canal normal.  Nose: Right sinus exhibits maxillary sinus tenderness and frontal sinus tenderness. Left sinus exhibits maxillary sinus tenderness and frontal sinus tenderness.  Mouth/Throat: Uvula is midline, oropharynx is clear and moist and mucous membranes are normal.  Neck: Neck supple.  Cardiovascular: Normal rate, regular rhythm, normal heart sounds and intact distal pulses.   Pulmonary/Chest: Effort normal and breath sounds normal.  Neurological: She is alert and oriented to person, place, and time.  Skin: Skin is warm and dry.       Assessment & Plan:   Problem List Items Addressed This Visit      Respiratory   Acute sinusitis - Primary    Symptoms and exam consistent with pansinusitis most likely bacterial. Start Augmentin. In office injection Depo-Medrol provided. Continue over-the-counter medications as needed for symptom relief and supportive care. Follow-up if symptoms worsen or do not improve.      Relevant Medications   amoxicillin-clavulanate (AUGMENTIN)  875-125 MG tablet   methylPREDNISolone acetate (DEPO-MEDROL) injection 80 mg (Completed)       I am having Ms. Froman start on amoxicillin-clavulanate. I am also having her maintain her cyclobenzaprine, HYDROcodone-acetaminophen, ALPRAZolam, Cholecalciferol, and ranitidine. We administered methylPREDNISolone acetate.   Meds ordered this encounter  Medications  . amoxicillin-clavulanate (AUGMENTIN) 875-125 MG tablet    Sig: Take 1 tablet by mouth  2 (two) times daily.    Dispense:  20 tablet    Refill:  0    Order Specific Question:   Supervising Provider    Answer:   Pricilla Holm A [id number]  . methylPREDNISolone acetate (DEPO-MEDROL) injection 80 mg     Follow-up: Return if symptoms worsen or fail to improve.  Mauricio Po, FNP

## 2016-10-02 NOTE — Patient Instructions (Addendum)
Thank you for choosing [facility].  SUMMARY AND INSTRUCTIONS:  Medication:  Your prescription(s) have been submitted to your pharmacy or been printed and provided for you. Please take as directed and contact our office if you believe you are having problem(s) with the medication(s) or have any questions.   Follow up:  If your symptoms worsen or fail to improve, please contact our office for further instruction, or in case of emergency go directly to the emergency room at the closest medical facility.    General Recommendations:    Please drink plenty of fluids.  Get plenty of rest   Sleep in humidified air  Use saline nasal sprays  Netti pot   OTC Medications:  Decongestants - helps relieve congestion   Flonase (generic fluticasone) or Nasacort (generic triamcinolone) - please make sure to use the "cross-over" technique at a 45 degree angle towards the opposite eye as opposed to straight up the nasal passageway.   Sudafed (generic pseudoephedrine - Note this is the one that is available behind the pharmacy counter); Products with phenylephrine (-PE) may also be used but is often not as effective as pseudoephedrine.   If you have HIGH BLOOD PRESSURE - Coricidin HBP; AVOID any product that is -D as this contains pseudoephedrine which may increase your blood pressure.  Afrin (oxymetazoline) every 6-8 hours for up to 3 days.   Allergies - helps relieve runny nose, itchy eyes and sneezing   Claritin (generic loratidine), Allegra (fexofenidine), or Zyrtec (generic cyrterizine) for runny nose. These medications should not cause drowsiness.  Note - Benadryl (generic diphenhydramine) may be used however may cause drowsiness  Cough -   Delsym or Robitussin (generic dextromethorphan)  Expectorants - helps loosen mucus to ease removal   Mucinex (generic guaifenesin) as directed on the package.  Headaches / General Aches   Tylenol (generic acetaminophen) - DO  NOT EXCEED 3 grams (3,000 mg) in a 24 hour time period  Advil/Motrin (generic ibuprofen)   Sore Throat -   Salt water gargle   Chloraseptic (generic benzocaine) spray or lozenges / Sucrets (generic dyclonine)    Sinusitis Sinusitis is redness, soreness, and inflammation of the paranasal sinuses. Paranasal sinuses are air pockets within the bones of your face (beneath the eyes, the middle of the forehead, or above the eyes). In healthy paranasal sinuses, mucus is able to drain out, and air is able to circulate through them by way of your nose. However, when your paranasal sinuses are inflamed, mucus and air can become trapped. This can allow bacteria and other germs to grow and cause infection. Sinusitis can develop quickly and last only a short time (acute) or continue over a long period (chronic). Sinusitis that lasts for more than 12 weeks is considered chronic.  CAUSES  Causes of sinusitis include:  Allergies.  Structural abnormalities, such as displacement of the cartilage that separates your nostrils (deviated septum), which can decrease the air flow through your nose and sinuses and affect sinus drainage.  Functional abnormalities, such as when the small hairs (cilia) that line your sinuses and help remove mucus do not work properly or are not present. SIGNS AND SYMPTOMS  Symptoms of acute and chronic sinusitis are the same. The primary symptoms are pain and pressure around the affected sinuses. Other symptoms include:  Upper toothache.  Earache.  Headache.  Bad breath.  Decreased sense of smell and taste.  A cough, which worsens when you are lying flat.  Fatigue.  Fever.  Thick drainage   from your nose, which often is green and may contain pus (purulent).  Swelling and warmth over the affected sinuses. DIAGNOSIS  Your health care provider will perform a physical exam. During the exam, your health care provider may:  Look in your nose for signs of abnormal growths  in your nostrils (nasal polyps).  Tap over the affected sinus to check for signs of infection.  View the inside of your sinuses (endoscopy) using an imaging device that has a light attached (endoscope). If your health care provider suspects that you have chronic sinusitis, one or more of the following tests may be recommended:  Allergy tests.  Nasal culture. A sample of mucus is taken from your nose, sent to a lab, and screened for bacteria.  Nasal cytology. A sample of mucus is taken from your nose and examined by your health care provider to determine if your sinusitis is related to an allergy. TREATMENT  Most cases of acute sinusitis are related to a viral infection and will resolve on their own within 10 days. Sometimes medicines are prescribed to help relieve symptoms (pain medicine, decongestants, nasal steroid sprays, or saline sprays).  However, for sinusitis related to a bacterial infection, your health care provider will prescribe antibiotic medicines. These are medicines that will help kill the bacteria causing the infection.  Rarely, sinusitis is caused by a fungal infection. In theses cases, your health care provider will prescribe antifungal medicine. For some cases of chronic sinusitis, surgery is needed. Generally, these are cases in which sinusitis recurs more than 3 times per year, despite other treatments. HOME CARE INSTRUCTIONS   Drink plenty of water. Water helps thin the mucus so your sinuses can drain more easily.  Use a humidifier.  Inhale steam 3 to 4 times a day (for example, sit in the bathroom with the shower running).  Apply a warm, moist washcloth to your face 3 to 4 times a day, or as directed by your health care provider.  Use saline nasal sprays to help moisten and clean your sinuses.  Take medicines only as directed by your health care provider.  If you were prescribed either an antibiotic or antifungal medicine, finish it all even if you start to feel  better. SEEK IMMEDIATE MEDICAL CARE IF:  You have increasing pain or severe headaches.  You have nausea, vomiting, or drowsiness.  You have swelling around your face.  You have vision problems.  You have a stiff neck.  You have difficulty breathing. MAKE SURE YOU:   Understand these instructions.  Will watch your condition.  Will get help right away if you are not doing well or get worse. Document Released: 10/15/2005 Document Revised: 03/01/2014 Document Reviewed: 10/30/2011 ExitCare Patient Information 2015 [employer]. This information is not intended to replace advice given to you by your health care provider. Make sure you discuss any questions you have with your health care provider.   

## 2016-10-03 ENCOUNTER — Telehealth: Payer: Self-pay

## 2016-10-03 MED ORDER — LEVOFLOXACIN 500 MG PO TABS: 500.0000 mg | ORAL_TABLET | Freq: Every day | ORAL | 0 refills | Status: DC

## 2016-10-03 NOTE — Telephone Encounter (Signed)
Levofloxacin sent to pharmacy

## 2016-10-03 NOTE — Telephone Encounter (Signed)
Pt states the augmentin that was sent in yesterday made her extremely sick and made her throw up. She is wanting another antibiotic. Please advise

## 2016-10-03 NOTE — Telephone Encounter (Signed)
Pt aware.

## 2016-10-29 HISTORY — PX: COLONOSCOPY: SHX174

## 2016-11-16 ENCOUNTER — Telehealth: Payer: Self-pay | Admitting: Family

## 2016-11-16 MED ORDER — DOXYCYCLINE HYCLATE 100 MG PO TABS: 100.0000 mg | ORAL_TABLET | Freq: Two times a day (BID) | ORAL | 0 refills | Status: DC

## 2016-11-16 NOTE — Telephone Encounter (Signed)
Notified patient.

## 2016-11-16 NOTE — Telephone Encounter (Signed)
Doxycycline sent to pharmacy

## 2016-11-16 NOTE — Telephone Encounter (Signed)
Patient states she was seen on 12/5.  States she is not doing any better.  States she is having facial pain, headaches and ear pain.  Would like to know if meds could be extended?  Patient uses [facility] in [place].

## 2016-12-26 ENCOUNTER — Other Ambulatory Visit: Payer: Self-pay | Admitting: Family

## 2017-02-21 ENCOUNTER — Emergency Department (HOSPITAL_COMMUNITY)
Admission: EM | Admit: 2017-02-21 | Discharge: 2017-02-21 | Disposition: A | Discharge status: Home / Self Care | Payer: Medicare Other | Attending: Emergency Medicine | Admitting: Emergency Medicine

## 2017-02-21 ENCOUNTER — Emergency Department (HOSPITAL_COMMUNITY): Payer: Medicare Other

## 2017-02-21 ENCOUNTER — Encounter (HOSPITAL_COMMUNITY): Payer: Self-pay | Admitting: *Deleted

## 2017-02-21 DIAGNOSIS — R109 Unspecified abdominal pain: Secondary | ICD-10-CM | POA: Diagnosis present

## 2017-02-21 DIAGNOSIS — R112 Nausea with vomiting, unspecified: Secondary | ICD-10-CM | POA: Insufficient documentation

## 2017-02-21 DIAGNOSIS — Z79899 Other long term (current) drug therapy: Secondary | ICD-10-CM | POA: Diagnosis not present

## 2017-02-21 DIAGNOSIS — F17210 Nicotine dependence, cigarettes, uncomplicated: Secondary | ICD-10-CM | POA: Diagnosis not present

## 2017-02-21 DIAGNOSIS — R1084 Generalized abdominal pain: Secondary | ICD-10-CM | POA: Diagnosis not present

## 2017-02-21 DIAGNOSIS — R110 Nausea: Secondary | ICD-10-CM

## 2017-02-21 LAB — COMPREHENSIVE METABOLIC PANEL
ALT: 14 U/L (ref 14–54)
AST: 17 U/L (ref 15–41)
Albumin: 3.6 g/dL (ref 3.5–5.0)
Alkaline Phosphatase: 85 U/L (ref 38–126)
Anion gap: 6 (ref 5–15)
BUN: 15 mg/dL (ref 6–20)
CO2: 25 mmol/L (ref 22–32)
Calcium: 8.9 mg/dL (ref 8.9–10.3)
Chloride: 108 mmol/L (ref 101–111)
Creatinine, Ser: 0.7 mg/dL (ref 0.44–1.00)
GFR calc Af Amer: >60 mL/min (ref >60)
GFR calc non Af Amer: >60 mL/min (ref >60)
Glucose, Bld: 104 mg/dL — ABNORMAL HIGH (ref 65–99)
Potassium: 3.8 mmol/L (ref 3.5–5.1)
Sodium: 139 mmol/L (ref 135–145)
Total Bilirubin: 0.5 mg/dL (ref 0.3–1.2)
Total Protein: 7.4 g/dL (ref 6.5–8.1)

## 2017-02-21 LAB — CBC WITH DIFFERENTIAL/PLATELET
Basophils Absolute: 0 10*3/uL (ref 0.0–0.1)
Basophils Relative: 0 %
Eosinophils Absolute: 0.1 10*3/uL (ref 0.0–0.7)
Eosinophils Relative: 1 %
HCT: 38.3 % (ref 36.0–46.0)
Hemoglobin: 12.7 g/dL (ref 12.0–15.0)
Lymphocytes Relative: 26 %
Lymphs Abs: 3.4 10*3/uL (ref 0.7–4.0)
MCH: 29.5 pg (ref 26.0–34.0)
MCHC: 33.2 g/dL (ref 30.0–36.0)
MCV: 88.9 fL (ref 78.0–100.0)
Monocytes Absolute: 0.7 10*3/uL (ref 0.1–1.0)
Monocytes Relative: 5 %
Neutro Abs: 8.7 10*3/uL — ABNORMAL HIGH (ref 1.7–7.7)
Neutrophils Relative %: 68 %
Platelets: 345 10*3/uL (ref 150–400)
RBC: 4.31 MIL/uL (ref 3.87–5.11)
RDW: 13.2 % (ref 11.5–15.5)
WBC: 12.9 10*3/uL — ABNORMAL HIGH (ref 4.0–10.5)

## 2017-02-21 LAB — URINALYSIS, ROUTINE W REFLEX MICROSCOPIC
Bilirubin Urine: NEGATIVE
Glucose, UA: NEGATIVE mg/dL
Hgb urine dipstick: NEGATIVE
Ketones, ur: NEGATIVE mg/dL
Nitrite: NEGATIVE
Protein, ur: NEGATIVE mg/dL
Specific Gravity, Urine: 1.012 (ref 1.005–1.030)
pH: 6 (ref 5.0–8.0)

## 2017-02-21 LAB — I-STAT TROPONIN, ED: Troponin i, poc: 0 ng/mL (ref 0.00–0.08)

## 2017-02-21 MED ORDER — METOCLOPRAMIDE HCL 5 MG/ML IJ SOLN
10.0000 mg | Freq: Once | INTRAMUSCULAR | Status: AC
  Administered 2017-02-21: 10 mg via INTRAVENOUS
  Filled 2017-02-21: qty 2

## 2017-02-21 MED ORDER — ONDANSETRON HCL 4 MG PO TABS: 4.0000 mg | ORAL_TABLET | Freq: Four times a day (QID) | ORAL | 0 refills | Status: DC

## 2017-02-21 MED ORDER — SODIUM CHLORIDE 0.9 % IV BOLUS (SEPSIS)
500.0000 mL | Freq: Once | INTRAVENOUS | Status: AC
  Administered 2017-02-21: 500 mL via INTRAVENOUS

## 2017-02-21 MED ORDER — DIPHENHYDRAMINE HCL 50 MG/ML IJ SOLN
12.5000 mg | Freq: Once | INTRAMUSCULAR | Status: AC
  Administered 2017-02-21: 12.5 mg via INTRAVENOUS
  Filled 2017-02-21: qty 1

## 2017-02-21 MED ORDER — ONDANSETRON HCL 4 MG/2ML IJ SOLN
4.0000 mg | Freq: Once | INTRAMUSCULAR | Status: AC
  Administered 2017-02-21: 4 mg via INTRAVENOUS
  Filled 2017-02-21: qty 2

## 2017-02-21 NOTE — ED Triage Notes (Signed)
Pt brought by [facility] for c/o nausea; pt states she woke up and felt nauseous and when she got in bathroom she doesn't remember what happened;

## 2017-02-21 NOTE — ED Provider Notes (Signed)
[place] DEPT Provider Note   CSN: [id number] Arrival date & time: 02/21/17  0257     History   Chief Complaint Chief Complaint  Patient presents with  . Abdominal Pain    HPI [name] is a 56 y.o. female.  Patient presents to the emergency department for evaluation of abdominal discomfort. Patient reports that she went to bed feeling well, woke up feeling nauseated. She has had continuous severe nausea since awakening, but has not vomited. Patient denies chest pain. She is not experiencing any abdominal pain at this time.      Past Medical History:  Diagnosis Date  . Chest pain   . Depression   . DJD (degenerative joint disease)   . GERD (gastroesophageal reflux disease)   . History of palpitations   . Migraine headache   . PVC's (premature ventricular contractions)   . 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 Medicare annual wellness visit, subsequent 03/22/2016  . Overweight (BMI 25.0-29.9) 03/22/2016  . Sinusitis, chronic 03/17/2015  . Preventative health care 09/09/2014  . Vaginitis and vulvovaginitis 08/20/2014  . Meningitis, candidal 11/25/2012  . Tobacco abuse 11/25/2012  . Anxiety state 04/13/2010  . PREMATURE VENTRICULAR CONTRACTIONS 02/06/2010  . ELECTROCARDIOGRAM, ABNORMAL 02/06/2010  . Acute sinusitis 10/20/2008  . MIGRAINE HEADACHE 07/24/2007  . GASTROESOPHAGEAL REFLUX DISEASE 07/24/2007  . 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 CHOLECYSTECTOMY, HX OF 07/24/2007    Past Surgical History:  Procedure Laterality Date  . BACK SURGERY  10/23/12  . CHOLECYSTECTOMY    . LAMINECTOMY    . MRI     every 3 mths /due to menigitis  . TOTAL ABDOMINAL HYSTERECTOMY    . TUBAL LIGATION      OB History    No data available       Home Medications    Prior to Admission medications   Medication Sig Start Date End Date Taking? Authorizing Provider  ALPRAZolam  Duanne Moron) 0.5 MG tablet take 1 tablet by mouth at bedtime if needed 12/27/16  Yes Golden Circle, FNP  cyclobenzaprine (FLEXERIL) 5 MG tablet Take 5 mg by mouth 2 (two) times daily as needed for muscle spasms.  03/19/13  Yes Historical Provider, MD  HYDROcodone-acetaminophen (NORCO) 7.5-325 MG tablet Take 1 tablet by mouth every 6 (six) hours as needed for moderate pain.   Yes Historical Provider, MD  ranitidine (ZANTAC) 150 MG tablet take 1 tablet by mouth twice a day 09/19/16  Yes Golden Circle, FNP  ondansetron (ZOFRAN) 4 MG tablet Take 1 tablet (4 mg total) by mouth every 6 (six) hours. 02/21/17   Orpah Greek, MD    Family History Family History  Problem Relation Age of Onset  . Diabetes Mother     and multiplesclerosis  . Heart disease Father   . Hypertension Sister   . Kidney disease Sister     Social History Social History  Substance Use Topics  . Smoking status: Current Every Day Smoker    Packs/day: 1.00    Types: Cigarettes  . Smokeless tobacco: Never Used  . Alcohol use No     Allergies   Augmentin [amoxicillin-pot clavulanate]   Review of Systems Review of Systems  Respiratory: Negative for shortness of breath.   Cardiovascular: Negative for chest pain.  Gastrointestinal: Positive for abdominal pain and nausea.  All other systems reviewed and are negative.    Physical  Exam Updated Vital Signs BP (!) 121/92 (BP Location: Right Arm)   Pulse 74   Temp 98.1 F (36.7 C) (Oral)   Resp 16   Ht 5' 9.5" (1.765 m)   Wt 173 lb (78.5 kg)   SpO2 98%   BMI 25.18 kg/m   Physical Exam  Constitutional: She is oriented to person, place, and time. She appears well-developed and well-nourished. No distress.  HENT:  Head: Normocephalic and atraumatic.  Right Ear: Hearing normal.  Left Ear: Hearing normal.  Nose: Nose normal.  Mouth/Throat: Oropharynx is clear and moist and mucous membranes are normal.  Eyes: Conjunctivae and EOM are normal. Pupils are  equal, round, and reactive to light.  Neck: Normal range of motion. Neck supple.  Cardiovascular: Regular rhythm, S1 normal and S2 normal.  Exam reveals no gallop and no friction rub.   No murmur heard. Pulmonary/Chest: Effort normal and breath sounds normal. No respiratory distress. She exhibits no tenderness.  Abdominal: Soft. Normal appearance and bowel sounds are normal. There is no hepatosplenomegaly. There is no tenderness. There is no rebound, no guarding, no tenderness at McBurney's point and negative Murphy's sign. No hernia.  Musculoskeletal: Normal range of motion.  Neurological: She is alert and oriented to person, place, and time. She has normal strength. No cranial nerve deficit or sensory deficit. Coordination normal. GCS eye subscore is 4. GCS verbal subscore is 5. GCS motor subscore is 6.  Skin: Skin is warm, dry and intact. No rash noted. No cyanosis.  Psychiatric: She has a normal mood and affect. Her speech is normal and behavior is normal. Thought content normal.  Nursing note and vitals reviewed.    ED Treatments / Results  Labs (all labs ordered are listed, but only abnormal results are displayed) Labs Reviewed  CBC WITH DIFFERENTIAL/PLATELET - Abnormal; Notable for the following:       Result Value   WBC 12.9 (*)    Neutro Abs 8.7 (*)    All other components within normal limits  COMPREHENSIVE METABOLIC PANEL - Abnormal; Notable for the following:    Glucose, Bld 104 (*)    All other components within normal limits  URINALYSIS, ROUTINE W REFLEX MICROSCOPIC - Abnormal; Notable for the following:    Leukocytes, UA TRACE (*)    Bacteria, UA RARE (*)    Squamous Epithelial / LPF 0-5 (*)    All other components within normal limits  I-STAT TROPOININ, ED    EKG  EKG Interpretation  Date/Time:  Thursday February 21 2017 03:11:17 EDT Ventricular Rate:  69 PR Interval:    QRS Duration: 83 QT Interval:  393 QTC Calculation: 421 R Axis:   69 Text Interpretation:   Sinus rhythm Consider left atrial enlargement Confirmed by Betsey Holiday  MD, Rosalind Guido [phone]) on 02/21/2017 3:17:51 AM       Radiology Dg Abd Acute W/chest  Result Date: 02/21/2017 CLINICAL DATA:  56 year old female with abdominal pain and nausea. EXAM: DG ABDOMEN ACUTE W/ 1V CHEST COMPARISON:  None. FINDINGS: The lungs are clear. There is no pleural effusion or pneumothorax. The cardiac silhouette is within normal limits. Lower cervical fixation plate and screws partially visualized. There is no bowel dilatation or evidence of obstruction. No free air or radiopaque calculi noted. Moderate stool noted throughout the colon. Right upper quadrant cholecystectomy clips noted. There is degenerative changes of the spine with facet hypertrophy. No acute osseous pathology. IMPRESSION: No acute cardiopulmonary process. No bowel obstruction or free air. Electronically Signed  By: Anner Crete M.D.   On: 02/21/2017 05:39    Procedures Procedures (including critical care time)  Medications Ordered in ED Medications  sodium chloride 0.9 % bolus 500 mL (0 mLs Intravenous Stopped 02/21/17 0505)  ondansetron (ZOFRAN) injection 4 mg (4 mg Intravenous Given 02/21/17 0338)  metoCLOPramide (REGLAN) injection 10 mg (10 mg Intravenous Given 02/21/17 0423)  diphenhydrAMINE (BENADRYL) injection 12.5 mg (12.5 mg Intravenous Given 02/21/17 0423)     Initial Impression / Assessment and Plan / ED Course  I have reviewed the triage vital signs and the nursing notes.  Pertinent labs & imaging results that were available during my care of the patient were reviewed by me and considered in my medical decision making (see chart for details).     Patient presents to the emergency para for evaluation of nausea and vomiting. Patient did have some mild abdominal pain earlier but this has resolved. She is now experiencing severe nausea at arrival. Exam was benign, no concern for acute surgical process. Patient has undergone  blood work. This is normal. Urinalysis was also normal. Cardiac evaluation unremarkable, not experiencing any chest pain. Patient did report sensation of constipation, x-ray is unremarkable. No evidence of free air or obstruction. Patient treated with Zofran, Reglan, Benadryl, IV fluids. She is feeling much improvement. Vital signs are normal. Recheck reveals no abdominal pain or tenderness on exam. Patient will be discharged with symptomatic treatment.  Final Clinical Impressions(s) / ED Diagnoses   Final diagnoses:  Generalized abdominal pain  Nausea    New Prescriptions New Prescriptions   ONDANSETRON (ZOFRAN) 4 MG TABLET    Take 1 tablet (4 mg total) by mouth every 6 (six) hours.     Orpah Greek, MD 02/21/17 [phone]

## 2017-02-21 NOTE — ED Notes (Signed)
Informed pt that we would need a urine sample. Pt states that she is a little too nauseous to stand up. Per RN Otila Kluver we will ask her for a sample again after some time has passed.

## 2017-02-21 NOTE — ED Notes (Signed)
Pt assisted to the bathroom and back to bed, urine specimen obtained and sent to lab; pt given an extra pillow

## 2017-02-22 ENCOUNTER — Encounter: Payer: Self-pay | Admitting: Internal Medicine

## 2017-02-22 ENCOUNTER — Ambulatory Visit (INDEPENDENT_AMBULATORY_CARE_PROVIDER_SITE_OTHER): Payer: Medicare Other | Admitting: Internal Medicine

## 2017-02-22 VITALS — BP 110/76 | HR 82 | Ht 69.0 in | Wt 167.0 lb

## 2017-02-22 DIAGNOSIS — J309 Allergic rhinitis, unspecified: Secondary | ICD-10-CM | POA: Diagnosis not present

## 2017-02-22 DIAGNOSIS — I951 Orthostatic hypotension: Secondary | ICD-10-CM | POA: Insufficient documentation

## 2017-02-22 DIAGNOSIS — R110 Nausea: Secondary | ICD-10-CM

## 2017-02-22 DIAGNOSIS — J0140 Acute pansinusitis, unspecified: Secondary | ICD-10-CM

## 2017-02-22 MED ORDER — CEFTRIAXONE SODIUM 1 G IJ SOLR
1.0000 g | Freq: Once | INTRAMUSCULAR | Status: AC
  Administered 2017-02-22: 1 g via INTRAMUSCULAR

## 2017-02-22 MED ORDER — AZITHROMYCIN 250 MG PO TABS: ORAL_TABLET | ORAL | 1 refills | Status: DC

## 2017-02-22 MED ORDER — ONDANSETRON HCL 4 MG PO TABS: 4.0000 mg | ORAL_TABLET | Freq: Four times a day (QID) | ORAL | 0 refills | Status: DC

## 2017-02-22 MED ORDER — METHYLPREDNISOLONE ACETATE 80 MG/ML IJ SUSP
80.0000 mg | Freq: Once | INTRAMUSCULAR | Status: AC
  Administered 2017-02-22: 80 mg via INTRAMUSCULAR

## 2017-02-22 NOTE — Progress Notes (Signed)
Pre visit review using our clinic review tool, if applicable. No additional management support is needed unless otherwise documented below in the visit note. 

## 2017-02-22 NOTE — Progress Notes (Signed)
Subjective:    Patient ID: [name], female    DOB: [birth year], 56 y.o.   MRN: [id number]  HPI  Here to f/u after being seen in ED yesterday AM, treated for nausea and dehydration with IVF, but without vomiting, abd pain, or fever.   ED evaluation negative including UA, and pt felt safe for d/c to home after feeling improved.  Unfortunately she has not filled the zofran rx from ED, has persistent nausea again yesterday, and today c/o general weakness and dizziness. Denies worsening reflux, abd pain, dysphagia, n/v, bowel change or blood.  Denies urinary symptoms such as dysuria, frequency, urgency, flank pain, hematuria or n/v, fever, chills.  States now she had syncopal episode yesterday am prior to being seen in ED. Pt denies chest pain, increased sob or doe, wheezing, orthopnea, PND, increased LE swelling, palpitations.  Pt denies new neurological symptoms such as new headache, or facial or extremity weakness or numbness   Pt denies polydipsia, polyuria.  No falls . Also  Here with 2-3 days subacute onset fever, facial pain, pressure, headache and Does have several wks ongoing nasal allergy symptoms with clearish congestion, itch and sneezing  Past Medical History:  Diagnosis Date  . Chest pain   . Depression   . DJD (degenerative joint disease)   . GERD (gastroesophageal reflux disease)   . History of palpitations   . Migraine headache   . PVC's (premature ventricular contractions)   . 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 BACK SURGERY  10/23/12  . CHOLECYSTECTOMY    . LAMINECTOMY    . MRI     every 3 mths /due to menigitis  . TOTAL ABDOMINAL HYSTERECTOMY    . TUBAL LIGATION      reports that she has been smoking Cigarettes.  She has been smoking about 1.00 pack per day. She has never used smokeless tobacco. She reports that she does not drink alcohol or use drugs. family history includes Diabetes in her mother; Heart  disease in her father; Hypertension in her sister; Kidney disease in her sister. Allergies  Allergen Reactions  . Augmentin [Amoxicillin-Pot Clavulanate] Nausea And Vomiting   Current Outpatient Prescriptions on File Prior to Visit  Medication Sig Dispense Refill  . ALPRAZolam (XANAX) 0.5 MG tablet take 1 tablet by mouth at bedtime if needed 30 tablet 0  . cyclobenzaprine (FLEXERIL) 5 MG tablet Take 5 mg by mouth 2 (two) times daily as needed for muscle spasms.     Marland Kitchen HYDROcodone-acetaminophen (NORCO) 7.5-325 MG tablet Take 1 tablet by mouth every 6 (six) hours as needed for moderate pain.    . ranitidine (ZANTAC) 150 MG tablet take 1 tablet by mouth twice a day 60 tablet 11   No current facility-administered medications on file prior to visit.    Review of Systems  Constitutional: Negative for other unusual diaphoresis or sweats HENT: Negative for ear discharge or swelling Eyes: Negative for other worsening visual disturbances Respiratory: Negative for stridor or other swelling  Gastrointestinal: Negative for worsening distension or other blood Genitourinary: Negative for retention or other urinary change Musculoskeletal: Negative for other MSK pain or swelling Skin: Negative for color change or other new lesions Neurological: Negative for worsening tremors and other numbness  Psychiatric/Behavioral: Negative for worsening agitation or other fatigue All other system neg per pt    Objective:   Physical Exam BP 110/76   Pulse 82   Ht 5'  9" (1.753 m)   Wt 167 lb (75.8 kg)   SpO2 99%   BMI 24.66 kg/m  VS noted, + orthostasis - lying 128/82, and standing 100/80 Constitutional: Pt appears in NAD except fatigued, weakened HENT: Head: NCAT.  Right Ear: External ear normal.  Left Ear: External ear normal.  Eyes: . Pupils are equal, round, and reactive to light. Conjunctivae and EOM are normal Nose: without d/c or deformity Bilat tm's with mild erythema.  Max sinus areas mild tender.   Pharynx with mild erythema, no exudate Neck: Neck supple. Gross normal ROM but with mild bilat submandib LA Cardiovascular: Normal rate and regular rhythm.   Pulmonary/Chest: Effort normal and breath sounds without rales or wheezing.  Abd:  Soft, NT, ND, + BS, no organomegaly Neurological: Pt is alert. At baseline orientation, motor grossly intact Skin: Skin is warm. No rashes, other new lesions, no LE edema Psychiatric: Pt behavior is normal without agitation  No other exam findings  Lab Results  Component Value Date   WBC 12.9 (H) 02/21/2017   HGB 12.7 02/21/2017   HCT 38.3 02/21/2017   PLT 345 02/21/2017   GLUCOSE 104 (H) 02/21/2017   CHOL 176 03/22/2016   TRIG 72.0 03/22/2016   HDL 49.10 03/22/2016   LDLCALC 112 (H) 03/22/2016   ALT 14 02/21/2017   AST 17 02/21/2017   NA 139 02/21/2017   K 3.8 02/21/2017   CL 108 02/21/2017   CREATININE 0.70 02/21/2017   BUN 15 02/21/2017   CO2 25 02/21/2017   TSH 1.37 09/09/2014        Assessment & Plan:

## 2017-02-22 NOTE — Patient Instructions (Addendum)
You had the antibiotic shot (rocephin) and steroid shot in the office today  Please take all new medication as prescribed - the pill antibiotic, and the nausea medication (sent to your pharmacy already)  Please drink at least one glass water every 2 hours until you feel improved with the dizziness and weakness  Please return to the ED if you have worsening dizziness or weakness, especially if there is any further passing out, as you will need further IV fluids  NO DRIVING until you feel better, as it may take several days  Please continue all other medications as before, and refills have been done if requested.  Please have the pharmacy call with any other refills you may need.  Please keep your appointments with your specialists as you may have planned

## 2017-02-23 NOTE — Assessment & Plan Note (Signed)
Mild to mod, for depomedrol 80 IM, otc zyrtec prn,  to f/u any worsening symptoms or concerns

## 2017-02-23 NOTE — Assessment & Plan Note (Addendum)
Urged pt to return to ED but declines, so encouraged as much gatorade or similar or water as she can manage over the next 1-2 days  Note:  Total time for pt hx, exam, review of record with pt in the room, determination of diagnoses and plan for further eval and tx is > 40 min, with over 50% spent in coordination and counseling of patient

## 2017-02-23 NOTE — Assessment & Plan Note (Signed)
Encouraged pt to take the zofran prn, to assist with reduced po intake ability

## 2017-02-23 NOTE — Assessment & Plan Note (Signed)
Mild to mod, for antibx course,  to f/u any worsening symptoms or concerns 

## 2017-03-22 ENCOUNTER — Encounter: Payer: Self-pay | Admitting: Gastroenterology

## 2017-04-17 ENCOUNTER — Encounter: Payer: Self-pay | Admitting: Neurology

## 2017-04-17 ENCOUNTER — Ambulatory Visit (INDEPENDENT_AMBULATORY_CARE_PROVIDER_SITE_OTHER): Payer: Medicare Other | Admitting: Neurology

## 2017-04-17 VITALS — BP 124/82 | HR 95 | Ht 68.0 in | Wt 164.4 lb

## 2017-04-17 DIAGNOSIS — E237 Disorder of pituitary gland, unspecified: Secondary | ICD-10-CM

## 2017-04-17 DIAGNOSIS — G243 Spasmodic torticollis: Secondary | ICD-10-CM | POA: Diagnosis not present

## 2017-04-17 NOTE — Progress Notes (Signed)
[facility]    Provider:  Dr Jaynee Eagles Referring Provider: Golden Circle, FNP Primary Care Physician:  Golden Circle, FNP  CC:  Pituitary lesion and headaches  HPI:  [name] is a 56 y.o. female here as a referral from Dr. Elna Breslow for neck pain and headaches. 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 She was seeing Doreene Nest with [facility] had been following her for white spots on her brain that never changes (likely microvascular ischemia). She is not aware of ever having a pituitary lesion (MRI report in 09/2011 made no mention). She is post menopause, no leakage from breasts, no hyperactivity or fatigue. She has vision changes with migraines but she also has vision changes of the left eye  Peripheral and laterally sometimes when she is looking at something for a period of time trying to focus but brief and not continuous and more when focusing and turning. She is having worsening headaches, from the neck radiating into the head from the neck. Never had a headache with vomiting or nausea but she does get some light sensitivity. Migraines are once a month for 15 minutes and may be more sinus related she has a lot of sinus problems. No other focal neurologic deficits, associated symptoms, inciting events or modifiable factors. No unexplained weight changes, she feels tired and has temperature regulation issues due to menopause currently.   Reviewed notes, labs and imaging from outside physicians, which showed:   Patient has Flexeril, hydrocodone acetaminophen, alprazolam, and ranitidine on her med list. Reviewed notes from [facility]. Patient presented there with neck pain. The patient reported symptoms involving the entire neck left side and  left arm being bothered by 2 which began years ago in 1999. Symptoms began without any known injury. It involves neck stiffness, muscle spasm, crepitus, tenderness, shoulder pain, tingling, arm numbness, upper extremity weakness and headache. Pain radiates to the left shoulder left arm and forearm left hand right trapezius. Pain is aching. Moderate in severity of pain. Rates it at 5 out of 10. Symptoms are exacerbated by neck flexion and neck extension. Not exacerbated by turning the head to the left or the right or use of arms. Arms are relieved by rest. She's tried Flexeril, opioids, she has a past medical history of spine surgery. Evaluation has included cervical spine MRI and nerve conduction velocity studies. And spinal surgery twice.She has had C7-T1 foraminal stenosis when she was originally seen in 2006 and previous surgical interventions anteriorly C4-C7, posteriorly C3-C5 with solid posterior and anterior fusions. No residual significant canal or foraminal stenosis at the surgical levels. There is a moderate right base disc at C2-C3 with dense the anterior thecal sac but only mild central canal stenosis and mild foraminal stenosis. At C7-T1 she has a moderate broad-based posterior disc bone spur, plus with bilateral neural foraminal narrowing more severe on the left. Clinically she has mostly left CVA type symptoms with ulnar 2 digits being numb and dysesthetic. She has some lateral sided forearm numbness and dysesthesia. No motor deficits. Her gait pattern is a little ataxic but she does not have a history of falls. She has chronic low back pain but her neck pain far exceeds her back issues. Also on the MRI there is noted be a 6 mm pituitary lesion.  She was sent here for evaluation of the pituitary lesion. On the also recommended a left C8 selective nerve root block.  Reviewed MRI of the cervical spine report (I do not have images): Previous posterior decompressive surgery and posterior surgical fusion  at C3-C4 and C4-C5. Anterior surgical fusion at C4-C5, C5-C6 and C6-C7. At C2-C3 degenerative changes have resulted in mild central canal stenosis with minimal deformity of the posterior surface of the cord. At C7-T1, advanced degenerative disc disease with a posterior disc osteophyte complex. No cord deformity or central canal stenosis. Moderate to severe left neural foraminal narrowing with probable encroachment on the left C8 dorsal root ganglion. Mild diffuse myelomalacia at C3-C4, C4-C5, and C5-C6. Effusion of the left atlantooccipital joint probably degenerative in origin. Mild focal kyphosis at C7-T1. A 6 mm round lesion in the posterior aspect of the pituitary gland. Recommend MRI with pituitary protocol for further evaluation.   Reviewed MRI of the brain report from 10/17/2011 which does not make note of any pituitary abnormalities. In fact no intracranial abnormalities noted and no areas of abnormal enhancement.  Review of Systems: Patient complains of symptoms per HPI as well as the following symptoms: headache, numbness, weakness, dizziness, joint pain, . Pertinent negatives and positives per HPI. All others negative.   Social History   Social History  . Marital status: Married    Spouse name: N/A  . Number of children: 2  . Years of education: 12   Occupational History  . Retired/disabled    Social History Main Topics  . Smoking status: Current Every Day Smoker    Packs/day: 1.50    Types: Cigarettes  . Smokeless tobacco: Never Used  . Alcohol use No  . Drug use: No  . Sexual activity: Yes   Other Topics Concern  . Not on file   Social History Narrative   Occupation: retired [employer]   Married   Alcohol no   Drug use no, no caffiene   Tobacco use- yes quit Dec '08   Regular Exercise no   4 children; reports increased stress in due to caring for grandchildren   UNEMPLOYED      Lives at home w/ her husband   Right-handed   Caffeine: 1  cup of coffee    Family History  Problem Relation Age of Onset  . Diabetes Mother   . Multiple sclerosis Mother   . Heart disease Father   . Hypertension Sister   . Kidney disease Sister     Past Medical History:  Diagnosis Date  . Chest pain   . Depression   . DJD (degenerative joint disease)   . GERD (gastroesophageal reflux disease)   . History of palpitations   . Migraine headache   . PVC's (premature ventricular contractions)   . 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 BACK SURGERY  10/23/12  . CHOLECYSTECTOMY    . LAMINECTOMY    . MRI     every 3 mths /due to menigitis  . TOTAL ABDOMINAL HYSTERECTOMY    . TUBAL LIGATION      Current Outpatient Prescriptions  Medication Sig Dispense Refill  . ALPRAZolam (XANAX) 0.5 MG tablet take 1 tablet by mouth at bedtime if needed 30 tablet 0  . azithromycin (ZITHROMAX Z-PAK) 250 MG tablet 2 tab by mouth daily, then 1 per day 6 tablet 1  . cyclobenzaprine (FLEXERIL) 5 MG tablet Take 5 mg by  mouth 2 (two) times daily as needed for muscle spasms.     Marland Kitchen HYDROcodone-acetaminophen (NORCO) 7.5-325 MG tablet Take 1 tablet by mouth every 6 (six) hours as needed for moderate pain.    Marland Kitchen ondansetron (ZOFRAN) 4 MG tablet Take 1 tablet (4 mg total) by mouth every 6 (six) hours. 20 tablet 0  . ranitidine (ZANTAC) 150 MG tablet take 1 tablet by mouth twice a day 60 tablet 11   No current facility-administered medications for this visit.     Allergies as of 04/17/2017 - Review Complete 04/17/2017  Allergen Reaction Noted  . Anesthesia s-i-40 [propofol]  04/17/2017  . Augmentin [amoxicillin-pot clavulanate] Nausea And Vomiting 10/03/2016    Vitals: BP 124/82   Pulse 95   Ht 5\' 8"  (1.727 m)   Wt 164 lb 6.4 oz (74.6 kg)   BMI 25.00 kg/m  Last Weight:  Wt Readings from Last 1 Encounters:  04/17/17 164 lb 6.4 oz (74.6 kg)   Last Height:   Ht Readings from Last 1 Encounters:    04/17/17 5\' 8"  (1.727 m)   Physical exam: Exam: Gen: NAD, conversant, well nourised, obese, well groomed                     CV: RRR, no MRG. No Carotid Bruits. No peripheral edema, warm, nontender Eyes: Conjunctivae clear without exudates or hemorrhage MSK: hypertrophied cervical muscles, elevated right shoulder, decreased ROM of neck, left laterocollis and right torticollis.   Neuro: Detailed Neurologic Exam  Speech:    Speech is normal; fluent and spontaneous with normal comprehension.  Cognition:    The patient is oriented to person, place, and time;     recent and remote memory intact;     language fluent;     normal attention, concentration,     fund of knowledge Cranial Nerves:    The pupils are equal, round, and reactive to light. The fundi are normal and spontaneous venous pulsations are present. Visual fields are full to finger confrontation. Extraocular movements are intact. Trigeminal sensation is intact and the muscles of mastication are normal. The face is symmetric. The palate elevates in the midline. Hearing intact. Voice is normal. Shoulder shrug is normal. The tongue has normal motion without fasciculations.   Coordination:    Normal finger to nose and heel to shin. Normal rapid alternating movements.   Gait:    Heel-toe and tandem gait are normal.   Motor Observation:    No asymmetry, no atrophy, and no involuntary movements noted. Tone:    Normal muscle tone.    Posture:    Posture is normal. normal erect    Strength: Some proximal weakness with giveway due to pain. .      Sensation: intact to LT     Reflex Exam:  DTR's:    Deep tendon reflexes in the upper and lower extremities are brisk bilaterally.   Toes:    The toes are downgoing bilaterally.   Clonus:    Clonus is absent.    Assessment/Plan:  56 y.o. female here as a referral from Dr. Elna Breslow for neck pain and headaches. 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 Recent MRI of the brain shows 6 mm pituitary lesion which appears new in the last few years, previous MRIs of  the brain did not show this.  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 The neck pain is slowly progressive over years causing dysfunction and difficulty walking and headaches, pain, decreased mobility of the neck. Failed muscle relaxers, had physical therapy, tried Flexeril, hydrocodone, baclofen, amitriptyline, OTC analgesics and NSAIDs and failed them all over the last many years. Will refer to my colleague Dr. Krista Blue.   Orders Placed This Encounter  Procedures  . MR BRAIN W WO CONTRAST  . Prolactin  . Thyroid Panel With TSH     Sarina Ill, MD  [facility] [address] [place], [place] [address]  Phone [phone] Fax [phone]

## 2017-04-17 NOTE — Patient Instructions (Addendum)
Remember to drink plenty of fluid, eat healthy meals and do not skip any meals. Try to eat protein with a every meal and eat a healthy snack such as fruit or nuts in between meals. Try to keep a regular sleep-wake schedule and try to exercise daily, particularly in the form of walking, 20-30 minutes a day, if you can.   As far as your medications are concerned, I would like to suggest  As far as diagnostic testing: MRI brain, Botox for Cervical Dystonia  Our phone number is [phone]. We also have an after hours call service for urgent matters and there is a physician on-call for urgent questions. For any emergencies you know to call 911 or go to the nearest emergency room

## 2017-04-18 LAB — THYROID PANEL WITH TSH
Free Thyroxine Index: 2.2 (ref 1.2–4.9)
T3 Uptake Ratio: 28 % (ref 24–39)
T4, Total: 7.9 ug/dL (ref 4.5–12.0)
TSH: 1.36 u[IU]/mL (ref 0.450–4.500)

## 2017-04-18 LAB — PROLACTIN: Prolactin: 5.8 ng/mL (ref 4.8–23.3)

## 2017-04-25 ENCOUNTER — Telehealth: Payer: Self-pay | Admitting: Neurology

## 2017-04-25 NOTE — Telephone Encounter (Signed)
I called to schedule patient with Dr. Krista Blue for botox injection. She did not answer so I left a VM asking her to call back.

## 2017-04-29 NOTE — Telephone Encounter (Signed)
I spoke with the patient, does she need a consult with Dr. Krista Blue before the injections?

## 2017-05-04 ENCOUNTER — Ambulatory Visit
Admission: RE | Admit: 2017-05-04 | Discharge: 2017-05-04 | Disposition: A | Discharge status: Home / Self Care | Payer: Medicare Other | Source: Ambulatory Visit | Attending: Neurology | Admitting: Neurology

## 2017-05-04 ENCOUNTER — Other Ambulatory Visit: Payer: Medicare Other

## 2017-05-04 DIAGNOSIS — E237 Disorder of pituitary gland, unspecified: Secondary | ICD-10-CM | POA: Diagnosis not present

## 2017-05-04 MED ORDER — GADOBENATE DIMEGLUMINE 529 MG/ML IV SOLN
15.0000 mL | Freq: Once | INTRAVENOUS | Status: AC | PRN
  Administered 2017-05-04: 15 mL via INTRAVENOUS

## 2017-05-15 ENCOUNTER — Other Ambulatory Visit: Payer: Self-pay | Admitting: Family

## 2017-05-15 DIAGNOSIS — Z1231 Encounter for screening mammogram for malignant neoplasm of breast: Secondary | ICD-10-CM

## 2017-05-31 ENCOUNTER — Ambulatory Visit: Payer: Medicare Other

## 2017-06-17 ENCOUNTER — Ambulatory Visit: Payer: Medicare Other | Admitting: Neurology

## 2017-07-04 ENCOUNTER — Inpatient Hospital Stay: Admission: RE | Admit: 2017-07-04 | Payer: Medicare Other | Source: Ambulatory Visit

## 2017-07-15 ENCOUNTER — Encounter: Payer: Self-pay | Admitting: Gastroenterology

## 2017-07-25 ENCOUNTER — Ambulatory Visit: Payer: Medicare Other

## 2017-07-31 ENCOUNTER — Telehealth: Payer: Self-pay | Admitting: *Deleted

## 2017-07-31 ENCOUNTER — Ambulatory Visit: Payer: Medicare Other | Admitting: Neurology

## 2017-07-31 NOTE — Telephone Encounter (Signed)
No showed consult appointment.

## 2017-08-01 ENCOUNTER — Encounter: Payer: Self-pay | Admitting: Neurology

## 2017-08-06 ENCOUNTER — Ambulatory Visit: Payer: Medicare Other

## 2017-08-20 ENCOUNTER — Ambulatory Visit (AMBULATORY_SURGERY_CENTER): Payer: Self-pay

## 2017-08-20 ENCOUNTER — Ambulatory Visit (INDEPENDENT_AMBULATORY_CARE_PROVIDER_SITE_OTHER): Payer: Medicare Other

## 2017-08-20 ENCOUNTER — Telehealth: Payer: Self-pay | Admitting: Family

## 2017-08-20 ENCOUNTER — Encounter: Payer: Self-pay | Admitting: Gastroenterology

## 2017-08-20 VITALS — Ht 70.5 in | Wt 167.0 lb

## 2017-08-20 DIAGNOSIS — Z23 Encounter for immunization: Secondary | ICD-10-CM | POA: Diagnosis not present

## 2017-08-20 DIAGNOSIS — Z860100 Personal history of colon polyps, unspecified: Secondary | ICD-10-CM

## 2017-08-20 MED ORDER — RANITIDINE HCL 150 MG PO TABS: 150.0000 mg | ORAL_TABLET | Freq: Two times a day (BID) | ORAL | 1 refills | Status: DC

## 2017-08-20 MED ORDER — SUPREP BOWEL PREP KIT 17.5-3.13-1.6 GM/177ML PO SOLN: 1.0000 | Freq: Once | ORAL | 0 refills | Status: AC

## 2017-08-20 NOTE — Telephone Encounter (Signed)
Sent Zantac to pharmacy pls advise on Alprazolam refill...Johny Chess

## 2017-08-20 NOTE — Progress Notes (Signed)
2004 during neck surgery, allergy which caused lots of itching  2015 had propofol and did fine  No allergies to eggs or soy No diet meds No home oxygen  Declined emmi

## 2017-08-20 NOTE — Telephone Encounter (Signed)
Pt came by the office to reestablish care with [name] due to [facility] leaving. She said that she is due for a refill on her ALPRAZolam (XANAX) 0.5 MG tablet and ranitidine (ZANTAC) 150 MG tablet to [facility] in [place]. She made an appointment for the first available appointment (10/10/17).

## 2017-08-21 NOTE — Telephone Encounter (Signed)
LVM informing pt that she will not get a refill on xanax medication until seen.

## 2017-08-21 NOTE — Telephone Encounter (Signed)
I will need to see the patient in the office prior to refilling her xanax, as she has not been seen by her prior provider since September 2017 and has not refilled this medication since March.

## 2017-09-02 ENCOUNTER — Ambulatory Visit (AMBULATORY_SURGERY_CENTER): Payer: Medicare Other | Admitting: Gastroenterology

## 2017-09-02 ENCOUNTER — Encounter: Payer: Self-pay | Admitting: Gastroenterology

## 2017-09-02 VITALS — BP 122/79 | HR 63 | Temp 96.8°F | Resp 13 | Ht 68.0 in | Wt 164.0 lb

## 2017-09-02 DIAGNOSIS — Z86010 Personal history of colonic polyps: Secondary | ICD-10-CM

## 2017-09-02 MED ORDER — SODIUM CHLORIDE 0.9 % IV SOLN: 500.0000 mL | INTRAVENOUS | Status: DC

## 2017-09-02 NOTE — Progress Notes (Signed)
Pt's states no medical or surgical changes since previsit or office visit. 

## 2017-09-02 NOTE — Patient Instructions (Signed)
**   Normal Colonoscopy **   YOU HAD AN ENDOSCOPIC PROCEDURE TODAY AT THE [facility]:   Refer to the procedure report that was given to you for any specific questions about what was found during the examination.  If the procedure report does not answer your questions, please call your gastroenterologist to clarify.  If you requested that your care partner not be given the details of your procedure findings, then the procedure report has been included in a sealed envelope for you to review at your convenience later.  YOU SHOULD EXPECT: Some feelings of bloating in the abdomen. Passage of more gas than usual.  Walking can help get rid of the air that was put into your GI tract during the procedure and reduce the bloating. If you had a lower endoscopy (such as a colonoscopy or flexible sigmoidoscopy) you may notice spotting of blood in your stool or on the toilet paper. If you underwent a bowel prep for your procedure, you may not have a normal bowel movement for a few days.  Please Note:  You might notice some irritation and congestion in your nose or some drainage.  This is from the oxygen used during your procedure.  There is no need for concern and it should clear up in a day or so.  SYMPTOMS TO REPORT IMMEDIATELY:   Following lower endoscopy (colonoscopy or flexible sigmoidoscopy):  Excessive amounts of blood in the stool  Significant tenderness or worsening of abdominal pains  Swelling of the abdomen that is new, acute  Fever of 100F or higher  For urgent or emergent issues, a gastroenterologist can be reached at any hour by calling [phone].   DIET:  We do recommend a small meal at first, but then you may proceed to your regular diet.  Drink plenty of fluids but you should avoid alcoholic beverages for 24 hours.  ACTIVITY:  You should plan to take it easy for the rest of today and you should NOT DRIVE or use heavy machinery until tomorrow (because of the sedation  medicines used during the test).    FOLLOW UP: Our staff will call the number listed on your records the next business day following your procedure to check on you and address any questions or concerns that you may have regarding the information given to you following your procedure. If we do not reach you, we will leave a message.  However, if you are feeling well and you are not experiencing any problems, there is no need to return our call.  We will assume that you have returned to your regular daily activities without incident.  If any biopsies were taken you will be contacted by phone or by letter within the next 1-3 weeks.  Please call us at [phone] if you have not heard about the biopsies in 3 weeks.    SIGNATURES/CONFIDENTIALITY: You and/or your care partner have signed paperwork which will be entered into your electronic medical record.  These signatures attest to the fact that that the information above on your After Visit Summary has been reviewed and is understood.  Full responsibility of the confidentiality of this discharge information lies with you and/or your care-partner. 

## 2017-09-02 NOTE — Op Note (Signed)
[facility] Patient Name: [name] Procedure Date: 09/02/2017 11:03 AM MRN: [id number] Endoscopist: Ladene Artist , MD Age: 56 Referring MD:  Date of Birth: 12-26-1960 Gender: Female Account #: [id number] Procedure:                Colonoscopy Indications:              Surveillance: Personal history of adenomatous                            polyps on last colonoscopy 3 years ago Medicines:                Monitored Anesthesia Care Procedure:                Pre-Anesthesia Assessment:                           - Prior to the procedure, a History and Physical                            was performed, and patient medications and                            allergies were reviewed. The patient's tolerance of                            previous anesthesia was also reviewed. The risks                            and benefits of the procedure and the sedation                            options and risks were discussed with the patient.                            All questions were answered, and informed consent                            was obtained. Prior Anticoagulants: The patient has                            taken no previous anticoagulant or antiplatelet                            agents. ASA Grade Assessment: II - A patient with                            mild systemic disease. After reviewing the risks                            and benefits, the patient was deemed in                            satisfactory condition to undergo the procedure.  After obtaining informed consent, the colonoscope                            was passed under direct vision. Throughout the                            procedure, the patient's blood pressure, pulse, and                            oxygen saturations were monitored continuously. The                            Model PCF-[id number]) scope was introduced                            through the anus and  advanced to the the cecum,                            identified by appendiceal orifice and ileocecal                            valve. The ileocecal valve, appendiceal orifice,                            and rectum were photographed. The quality of the                            bowel preparation was excellent. The colonoscopy                            was performed without difficulty. The patient                            tolerated the procedure well. Scope In: 11:12:20 AM Scope Out: 11:24:55 AM Scope Withdrawal Time: 0 hours 8 minutes 35 seconds  Total Procedure Duration: 0 hours 12 minutes 35 seconds  Findings:                 The perianal and digital rectal examinations were                            normal.                           Multiple medium-mouthed diverticula were found in                            the left colon. There was narrowing of the colon in                            association with the diverticular opening. There                            was evidence of diverticular spasm. There was no  evidence of diverticular bleeding.                           Internal hemorrhoids were found during                            retroflexion. The hemorrhoids were small and Grade                            I (internal hemorrhoids that do not prolapse).                           The exam was otherwise without abnormality on                            direct and retroflexion views. Complications:            No immediate complications. Estimated blood loss:                            None. Estimated Blood Loss:     Estimated blood loss: none. Impression:               - Moderate diverticulosis in the left colon. There                            was narrowing of the colon in association with the                            diverticular opening. There was evidence of                            diverticular spasm. There was no evidence of                             diverticular bleeding.                           - Internal hemorrhoids.                           - The examination was otherwise normal on direct                            and retroflexion views.                           - No specimens collected. Recommendation:           - Repeat colonoscopy in 5 years for surveillance.                           - Patient has a contact number available for                            emergencies. The signs and symptoms of potential  delayed complications were discussed with the                            patient. Return to normal activities tomorrow.                            Written discharge instructions were provided to the                            patient.                           - Resume previous diet.                           - Continue present medications. Ladene Artist, MD 09/02/2017 11:27:58 AM This report has been signed electronically.

## 2017-09-02 NOTE — Progress Notes (Signed)
Report given to PACU, vss  Propofol used without any problems.  Cleared with pt before using.

## 2017-09-03 ENCOUNTER — Telehealth: Payer: Self-pay | Admitting: *Deleted

## 2017-09-03 NOTE — Telephone Encounter (Signed)
  Follow up Call-  Call back number 09/02/2017  Post procedure Call Back phone  # [phone]  Permission to leave phone message Yes  Some recent data might be hidden     Patient questions:  Do you have a fever, pain , or abdominal swelling? Yes.   Pain Score  2 *  Have you tolerated food without any problems? Yes.    Have you been able to return to your normal activities? Yes.    Do you have any questions about your discharge instructions: Diet   Yes.   Medications  No. Follow up visit  No.  Do you have questions or concerns about your Care? Yes.    Actions: * If pain score is 4 or above: No action needed, pain <4.

## 2017-10-10 ENCOUNTER — Ambulatory Visit (INDEPENDENT_AMBULATORY_CARE_PROVIDER_SITE_OTHER): Payer: Medicare Other | Admitting: Nurse Practitioner

## 2017-10-10 ENCOUNTER — Encounter: Payer: Self-pay | Admitting: Nurse Practitioner

## 2017-10-10 VITALS — BP 118/80 | HR 84 | Temp 98.2°F | Resp 16 | Ht 68.0 in | Wt 171.0 lb

## 2017-10-10 DIAGNOSIS — J321 Chronic frontal sinusitis: Secondary | ICD-10-CM | POA: Diagnosis not present

## 2017-10-10 DIAGNOSIS — F411 Generalized anxiety disorder: Secondary | ICD-10-CM

## 2017-10-10 MED ORDER — HYDROXYZINE PAMOATE 50 MG PO CAPS
50.0000 mg | ORAL_CAPSULE | Freq: Three times a day (TID) | ORAL | 0 refills | Status: DC | PRN
Start: 2017-10-10 — End: 2019-04-01

## 2017-10-10 MED ORDER — FLUTICASONE PROPIONATE 50 MCG/ACT NA SUSP: 2.0000 | Freq: Every day | NASAL | 6 refills | Status: DC

## 2017-10-10 NOTE — Progress Notes (Addendum)
Subjective:    Patient ID: [name], female    DOB: 19-Jun-1961, 56 y.o.   MRN: [id number]  HPI [name] is a 56 yo female who presents today to establish care. She is transferring to me from another provider in the same clinic. She presents today with chief complaint of sinus issues. she's also requesting a xanax refill.  Sinus issues- she has a history of chronic sinusitis. She was last treated with antibiotics around March. She thinks this was the only antibiotic shes been on this year. She reports chronic pressure behind her nose and eyes. She never noticed improvement after last antibiotic course in March. She feels her symptoms worsened about 4 weeks ago- incresaing head pressure and nasal stuffiness. She takes coricidin once daily and uses  rosemary,lavender, and eucalyptus essential oils diffused and steam inhalation with some relief. She reports postnasal drip, cough with clear sputum She denies fevers, confusion, fatigue, chest pain, shortness of breath, vision changes. She says she overall feels well and would prefer not to take antibiotics because they upset her stomach. She has not been to ENT for this issue in the past.  Anxiety- has been maintained on prn xanax for several years. She is currently out of this medication. Her last dose was last month. Her last 30 day supply lasted about 5 months Her anxiety began after she was hospitalized for meningitis in 2012. She's had neck and back pain since. She is followed by pain management for this. She says she still has some occasional worries about how ill she was during her hospitalization and fear that she could have died. She also feels occasional anxiety when dealing with family problems.  She has been to counseling in the past and prefers not to return as her anxiety is not a daily issue.  Review of Systems  See HPI  Past Medical History:  Diagnosis Date  . Chest pain   . Depression   . DJD (degenerative joint disease)     . GERD (gastroesophageal reflux disease)   . History of palpitations   . Migraine headache   . PVC's (premature ventricular contractions)   . RVOT ventricular tachycardia (HCC)      Social History   Socioeconomic History  . Marital status: Married    Spouse name: Not on file  . Number of children: 2  . Years of education: 31  . Highest education level: Not on file  Social Needs  . Financial resource strain: Not on file  . Food insecurity - worry: Not on file  . Food insecurity - inability: Not on file  . Transportation needs - medical: Not on file  . Transportation needs - non-medical: Not on file  Occupational History  . Occupation: Retired/disabled  Tobacco Use  . Smoking status: Current Every Day Smoker    Packs/day: 1.50    Types: Cigarettes  . Smokeless tobacco: Never Used  Substance and Sexual Activity  . Alcohol use: No  . Drug use: No  . Sexual activity: Yes  Other Topics Concern  . Not on file  Social History Narrative   Occupation: retired [employer]   Married   Alcohol no   Drug use no, no caffiene   Tobacco use- yes quit Dec '08   Regular Exercise no   4 children; reports increased stress in due to caring for grandchildren   UNEMPLOYED      Lives at home w/ her husband   Right-handed  Caffeine: 1 cup of coffee    Past Surgical History:  Procedure Laterality Date  . BACK SURGERY  10/23/12  . CHOLECYSTECTOMY    . LAMINECTOMY    . MRI     every 3 mths /due to menigitis  . TOTAL ABDOMINAL HYSTERECTOMY    . TUBAL LIGATION      Family History  Problem Relation Age of Onset  . Diabetes Mother   . Multiple sclerosis Mother   . Heart disease Father   . Hypertension Sister   . Kidney disease Sister   . Colon cancer Neg Hx     Allergies  Allergen Reactions  . Anesthesia S-I-40 [Propofol]   . Augmentin [Amoxicillin-Pot Clavulanate] Nausea And Vomiting    Current Outpatient Medications on File Prior to Visit   Medication Sig Dispense Refill  . ALPRAZolam (XANAX) 0.5 MG tablet take 1 tablet by mouth at bedtime if needed 30 tablet 0  . cyclobenzaprine (FLEXERIL) 5 MG tablet Take 5 mg by mouth 2 (two) times daily as needed for muscle spasms.     Marland Kitchen HYDROcodone-acetaminophen (NORCO) 7.5-325 MG tablet Take 1 tablet by mouth every 6 (six) hours as needed for moderate pain.    . ranitidine (ZANTAC) 150 MG tablet Take 1 tablet (150 mg total) by mouth 2 (two) times daily. Keep appt w/new provider for future refills 60 tablet 1   No current facility-administered medications on file prior to visit.     BP 118/80 (BP Location: Left Arm, Patient Position: Sitting, Cuff Size: Large)   Pulse 84   Temp 98.2 F (36.8 C) (Oral)   Resp 16   Ht 5\' 8"  (1.727 m)   Wt 171 lb (77.6 kg)   SpO2 98%   BMI 26.00 kg/m       Objective:   Physical Exam  Constitutional: She is oriented to person, place, and time. She appears well-developed and well-nourished.  HENT:  Head: Normocephalic and atraumatic. Head is without right periorbital erythema and without left periorbital erythema.  Right Ear: No drainage, swelling or tenderness. No mastoid tenderness. Tympanic membrane is bulging. No middle ear effusion.  Left Ear: No drainage, swelling or tenderness. No mastoid tenderness.  No middle ear effusion.  Nose: Right sinus exhibits frontal sinus tenderness. Right sinus exhibits no maxillary sinus tenderness. Left sinus exhibits frontal sinus tenderness. Left sinus exhibits no maxillary sinus tenderness.  Mouth/Throat: Uvula is midline and mucous membranes are normal. Posterior oropharyngeal erythema present. No oropharyngeal exudate or posterior oropharyngeal edema.  Eyes: Conjunctivae and lids are normal. Pupils are equal, round, and reactive to light. Right eye exhibits no discharge and no exudate. Left eye exhibits no discharge and no exudate.  Cardiovascular: Normal rate, regular rhythm, normal heart sounds and intact  distal pulses.  Pulmonary/Chest: Effort normal and breath sounds normal. No respiratory distress. She has no wheezes.  Neurological: She is alert and oriented to person, place, and time. Coordination normal.  Skin: Skin is warm and dry.  Psychiatric: She has a normal mood and affect. Judgment and thought content normal.      Assessment & Plan:  RTC  for an annual physical.

## 2017-10-10 NOTE — Assessment & Plan Note (Signed)
Chronic frontal sinusitis Stable- H&P does not show indications for antibiotics at this time. -discussed sinus irrigation- handout given - fluticasone (FLONASE) 50 MCG/ACT nasal spray; Place 2 sprays into both nostrils daily.  Dispense: 16 g; Refill: 6 - Ambulatory referral to ENT -instructions given to F/U for fevers over 101, severe headaches worsening symptoms, productive cough, or if she begins to not feel well

## 2017-10-10 NOTE — Assessment & Plan Note (Signed)
Intermittent, situational. We discussed risks a/w long term benzodiazepine use and we will hydroxyzine PRN instead as it has lower adverse effect profile. Discussed common side effects including drowsiness. She agrees to this plan. - hydrOXYzine (VISTARIL) 50 MG capsule; Take 1 capsule (50 mg total) by mouth 3 (three) times daily as needed.  Dispense: 30 capsule; Refill: 0

## 2017-10-10 NOTE — Patient Instructions (Addendum)
I have sent a prescription for flonase to your pharmacy. Please use flonase 2 sprays into both nostrils daily for the next week. It also may help to try sinus rinses-instructions are attached.  I have placed a referral to ENT. Our office will call you to schedule this appointment. You should hear from our office in 7-10 days.  I have also sent a prescription for hydroxyzine 50mg  up to three times daily as needed for anxiety. This medication can cause drowsiness.  Return at your convenience for an annual physical.  It was nice to meet you. Thanks for letting me take care of you today :)   Sinus Rinse What is a sinus rinse? A sinus rinse is a home treatment. It rinses your sinuses with a mixture of salt and water (saline solution). Sinuses are air-filled spaces in your skull behind the bones of your face and forehead. They open into your nasal cavity. To do a sinus rinse, you will need:  Saline solution.  Neti pot or spray bottle. This releases the saline solution into your nose and through your sinuses. You can buy neti pots and spray bottles at: ? [employer]. ? A health food store. ? Online.  When should I do a sinus rinse? A sinus rinse can help to clear your nasal cavity. It can clear:  Mucus.  Dirt.  Dust.  Pollen.  You may do a sinus rinse when you have:  A cold.  A virus.  Allergies.  A sinus infection.  A stuffy nose.  If you are considering a sinus rinse:  Ask your child's doctor before doing a sinus rinse on your child.  Do not do a sinus rinse if you have had: ? Ear or nasal surgery. ? An ear infection. ? Blocked ears.  How do I do a sinus rinse?  Wash your hands.  Disinfect your device using the directions that came with the device.  Dry your device.  Use the solution that comes with your device or one that is sold separately in stores. Follow the mixing directions on the package.  Fill your device with the amount of saline solution  as stated in the device instructions.  Stand over a sink and tilt your head sideways over the sink.  Place the spout of the device in your upper nostril (the one closer to the ceiling).  Gently pour or squeeze the saline solution into the nasal cavity. The liquid should drain to the lower nostril if you are not too congested.  Gently blow your nose. Blowing too hard may cause ear pain.  Repeat in the other nostril.  Clean and rinse your device with clean water.  Air-dry your device. Are there risks of a sinus rinse? Sinus rinse is normally very safe and helpful. However, there are a few risks, which include:  A burning feeling in the sinuses. This may happen if you do not make the saline solution as instructed. Make sure to follow all directions when making the saline solution.  Infection from unclean water. This is rare, but possible.  Nasal irritation.  This information is not intended to replace advice given to you by your health care provider. Make sure you discuss any questions you have with your health care provider. Document Released: 05/12/2014 Document Revised: 09/11/2016 Document Reviewed: 03/02/2014 Elsevier Interactive Patient Education  2017 [employer]  Sinusitis, Adult Sinusitis is soreness and inflammation of your sinuses. Sinuses are hollow spaces in the bones around your face. Your sinuses  are located:  Around your eyes.  In the middle of your forehead.  Behind your nose.  In your cheekbones.  Your sinuses and nasal passages are lined with a stringy fluid (mucus). Mucus normally drains out of your sinuses. When your nasal tissues become inflamed or swollen, the mucus can become trapped or blocked so air cannot flow through your sinuses. This allows bacteria, viruses, and funguses to grow, which leads to infection. Sinusitis can develop quickly and last for 7?10 days (acute) or for more than 12 weeks (chronic). Sinusitis often develops after a cold. What  are the causes? This condition is caused by anything that creates swelling in the sinuses or stops mucus from draining, including:  Allergies.  Asthma.  Bacterial or viral infection.  Abnormally shaped bones between the nasal passages.  Nasal growths that contain mucus (nasal polyps).  Narrow sinus openings.  Pollutants, such as chemicals or irritants in the air.  A foreign object stuck in the nose.  A fungal infection. This is rare.  What increases the risk? The following factors may make you more likely to develop this condition:  Having allergies or asthma.  Having had a recent cold or respiratory tract infection.  Having structural deformities or blockages in your nose or sinuses.  Having a weak immune system.  Doing a lot of swimming or diving.  Overusing nasal sprays.  Smoking.  What are the signs or symptoms? The main symptoms of this condition are pain and a feeling of pressure around the affected sinuses. Other symptoms include:  Upper toothache.  Earache.  Headache.  Bad breath.  Decreased sense of smell and taste.  A cough that may get worse at night.  Fatigue.  Fever.  Thick drainage from your nose. The drainage is often green and it may contain pus (purulent).  Stuffy nose or congestion.  Postnasal drip. This is when extra mucus collects in the throat or back of the nose.  Swelling and warmth over the affected sinuses.  Sore throat.  Sensitivity to light.  How is this diagnosed? This condition is diagnosed based on symptoms, a medical history, and a physical exam. To find out if your condition is acute or chronic, your health care provider may:  Look in your nose for signs of nasal polyps.  Tap over the affected sinus to check for signs of infection.  View the inside of your sinuses using an imaging device that has a light attached (endoscope).  If your health care provider suspects that you have chronic sinusitis, you may  also:  Be tested for allergies.  Have a sample of mucus taken from your nose (nasal culture) and checked for bacteria.  Have a mucus sample examined to see if your sinusitis is related to an allergy.  If your sinusitis does not respond to treatment and it lasts longer than 8 weeks, you may have an MRI or CT scan to check your sinuses. These scans also help to determine how severe your infection is. In rare cases, a bone biopsy may be done to rule out more serious types of fungal sinus disease. How is this treated? Treatment for sinusitis depends on the cause and whether your condition is chronic or acute. If a virus is causing your sinusitis, your symptoms will go away on their own within 10 days. You may be given medicines to relieve your symptoms, including:  Topical nasal decongestants. They shrink swollen nasal passages and let mucus drain from your sinuses.  Antihistamines. These drugs  block inflammation that is triggered by allergies. This can help to ease swelling in your nose and sinuses.  Topical nasal corticosteroids. These are nasal sprays that ease inflammation and swelling in your nose and sinuses.  Nasal saline washes. These rinses can help to get rid of thick mucus in your nose.  If your condition is caused by bacteria, you will be given an antibiotic medicine. If your condition is caused by a fungus, you will be given an antifungal medicine. Surgery may be needed to correct underlying conditions, such as narrow nasal passages. Surgery may also be needed to remove polyps. Follow these instructions at home: Medicines  Take, use, or apply over-the-counter and prescription medicines only as told by your health care provider. These may include nasal sprays.  If you were prescribed an antibiotic medicine, take it as told by your health care provider. Do not stop taking the antibiotic even if you start to feel better. Hydrate and Humidify  Drink enough water to keep your urine  clear or pale yellow. Staying hydrated will help to thin your mucus.  Use a cool mist humidifier to keep the humidity level in your home above 50%.  Inhale steam for 10-15 minutes, 3-4 times a day or as told by your health care provider. You can do this in the bathroom while a hot shower is running.  Limit your exposure to cool or dry air. Rest  Rest as much as possible.  Sleep with your head raised (elevated).  Make sure to get enough sleep each night. General instructions  Apply a warm, moist washcloth to your face 3-4 times a day or as told by your health care provider. This will help with discomfort.  Wash your hands often with soap and water to reduce your exposure to viruses and other germs. If soap and water are not available, use hand sanitizer.  Do not smoke. Avoid being around people who are smoking (secondhand smoke).  Keep all follow-up visits as told by your health care provider. This is important. Contact a health care provider if:  You have a fever.  Your symptoms get worse.  Your symptoms do not improve within 10 days. Get help right away if:  You have a severe headache.  You have persistent vomiting.  You have pain or swelling around your face or eyes.  You have vision problems.  You develop confusion.  Your neck is stiff.  You have trouble breathing. This information is not intended to replace advice given to you by your health care provider. Make sure you discuss any questions you have with your health care provider. Document Released: 10/15/2005 Document Revised: 06/10/2016 Document Reviewed: 08/10/2015 Elsevier Interactive Patient Education  2017 [employer].

## 2017-12-16 ENCOUNTER — Ambulatory Visit (INDEPENDENT_AMBULATORY_CARE_PROVIDER_SITE_OTHER)
Admission: RE | Admit: 2017-12-16 | Discharge: 2017-12-16 | Disposition: A | Discharge status: Home / Self Care | Payer: Medicare Other | Source: Ambulatory Visit | Attending: Family | Admitting: Family

## 2017-12-16 ENCOUNTER — Encounter: Payer: Self-pay | Admitting: Family

## 2017-12-16 ENCOUNTER — Ambulatory Visit (INDEPENDENT_AMBULATORY_CARE_PROVIDER_SITE_OTHER): Payer: Medicare Other | Admitting: Family

## 2017-12-16 ENCOUNTER — Other Ambulatory Visit (INDEPENDENT_AMBULATORY_CARE_PROVIDER_SITE_OTHER): Payer: Medicare Other

## 2017-12-16 ENCOUNTER — Ambulatory Visit: Payer: Medicare Other | Admitting: Nurse Practitioner

## 2017-12-16 VITALS — BP 116/82 | HR 65 | Temp 97.8°F | Ht 68.0 in | Wt 170.1 lb

## 2017-12-16 DIAGNOSIS — R1084 Generalized abdominal pain: Secondary | ICD-10-CM

## 2017-12-16 DIAGNOSIS — J321 Chronic frontal sinusitis: Secondary | ICD-10-CM

## 2017-12-16 DIAGNOSIS — E7800 Pure hypercholesterolemia, unspecified: Secondary | ICD-10-CM

## 2017-12-16 LAB — COMPREHENSIVE METABOLIC PANEL
ALT: 17 U/L (ref 0–35)
AST: 14 U/L (ref 0–37)
Albumin: 4.2 g/dL (ref 3.5–5.2)
Alkaline Phosphatase: 86 U/L (ref 39–117)
BUN: 10 mg/dL (ref 6–23)
CO2: 27 mEq/L (ref 19–32)
Calcium: 9.8 mg/dL (ref 8.4–10.5)
Chloride: 104 mEq/L (ref 96–112)
Creatinine, Ser: 0.73 mg/dL (ref 0.40–1.20)
GFR: 105.79 mL/min (ref >60.00)
Glucose, Bld: 93 mg/dL (ref 70–99)
Potassium: 4.7 mEq/L (ref 3.5–5.1)
Sodium: 139 mEq/L (ref 135–145)
Total Bilirubin: 0.7 mg/dL (ref 0.2–1.2)
Total Protein: 8.5 g/dL — ABNORMAL HIGH (ref 6.0–8.3)

## 2017-12-16 LAB — CBC WITH DIFFERENTIAL/PLATELET
Basophils Absolute: 0.1 10*3/uL (ref 0.0–0.1)
Basophils Relative: 0.7 % (ref 0.0–3.0)
Eosinophils Absolute: 0 10*3/uL (ref 0.0–0.7)
Eosinophils Relative: 0.6 % (ref 0.0–5.0)
HCT: 43.1 % (ref 36.0–46.0)
Hemoglobin: 14.2 g/dL (ref 12.0–15.0)
Lymphocytes Relative: 41.9 % (ref 12.0–46.0)
Lymphs Abs: 3.1 10*3/uL (ref 0.7–4.0)
MCHC: 33 g/dL (ref 30.0–36.0)
MCV: 88.7 fl (ref 78.0–100.0)
Monocytes Absolute: 0.4 10*3/uL (ref 0.1–1.0)
Monocytes Relative: 5.8 % (ref 3.0–12.0)
Neutro Abs: 3.7 10*3/uL (ref 1.4–7.7)
Neutrophils Relative %: 51 % (ref 43.0–77.0)
Platelets: 389 10*3/uL (ref 150.0–400.0)
RBC: 4.86 Mil/uL (ref 3.87–5.11)
RDW: 13 % (ref 11.5–15.5)
WBC: 7.3 10*3/uL (ref 4.0–10.5)

## 2017-12-16 LAB — LIPID PANEL
Cholesterol: 174 mg/dL (ref 0–200)
HDL: 51.8 mg/dL (ref >39.00)
LDL Cholesterol: 101 mg/dL — ABNORMAL HIGH (ref 0–99)
NonHDL: 122.68
Total CHOL/HDL Ratio: 3
Triglycerides: 107 mg/dL (ref 0.0–149.0)
VLDL: 21.4 mg/dL (ref 0.0–40.0)

## 2017-12-16 LAB — URINALYSIS
Bilirubin Urine: NEGATIVE
Hgb urine dipstick: NEGATIVE
Ketones, ur: NEGATIVE
Leukocytes, UA: NEGATIVE
Nitrite: NEGATIVE
Specific Gravity, Urine: 1.02 (ref 1.000–1.030)
Total Protein, Urine: NEGATIVE
Urine Glucose: NEGATIVE
Urobilinogen, UA: 0.2 (ref 0.0–1.0)
pH: 6.5 (ref 5.0–8.0)

## 2017-12-16 MED ORDER — FLUTICASONE PROPIONATE 50 MCG/ACT NA SUSP: 2.0000 | Freq: Every day | NASAL | 6 refills | Status: DC

## 2017-12-16 NOTE — Progress Notes (Signed)
[name] is a 57 y.o. female with the following history as recorded in [employer]:  Patient Active Problem List   Diagnosis Date Noted  . Orthostasis 02/22/2017  . Allergic rhinitis 02/22/2017  . Medicare annual wellness visit, subsequent 03/22/2016  . Overweight (BMI 25.0-29.9) 03/22/2016  . Sinusitis, chronic 03/17/2015  . Preventative health care 09/09/2014  . Vaginitis and vulvovaginitis 08/20/2014  . Meningitis, candidal 11/25/2012  . Tobacco abuse 11/25/2012  . Anxiety state 04/13/2010  . PREMATURE VENTRICULAR CONTRACTIONS 02/06/2010  . ELECTROCARDIOGRAM, ABNORMAL 02/06/2010  . MIGRAINE HEADACHE 07/24/2007  . GASTROESOPHAGEAL REFLUX DISEASE 07/24/2007  . 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 CHOLECYSTECTOMY, HX OF 07/24/2007    Current Outpatient Medications  Medication Sig Dispense Refill  . cyclobenzaprine (FLEXERIL) 5 MG tablet Take 5 mg by mouth 2 (two) times daily as needed for muscle spasms.     Marland Kitchen HYDROcodone-acetaminophen (NORCO) 7.5-325 MG tablet Take 1 tablet by mouth every 6 (six) hours as needed for moderate pain.    . hydrOXYzine (VISTARIL) 50 MG capsule Take 1 capsule (50 mg total) by mouth 3 (three) times daily as needed. 30 capsule 0  . ranitidine (ZANTAC) 150 MG tablet Take 1 tablet (150 mg total) by mouth 2 (two) times daily. Keep appt w/new provider for future refills 60 tablet 1  . fluticasone (FLONASE) 50 MCG/ACT nasal spray Place 2 sprays into both nostrils daily. 16 g 6   No current facility-administered medications for this visit.     Allergies: Anesthesia s-i-40 [propofol] and Augmentin [amoxicillin-pot clavulanate]  Past Medical History:  Diagnosis Date  . Chest pain   . Depression   . DJD (degenerative joint disease)   . GERD (gastroesophageal reflux disease)   . History of palpitations   . Migraine headache   . PVC's (premature ventricular contractions)   . RVOT ventricular tachycardia [facility])     Past Surgical  History:  Procedure Laterality Date  . BACK SURGERY  10/23/12  . CHOLECYSTECTOMY    . LAMINECTOMY    . MRI     every 3 mths /due to menigitis  . TOTAL ABDOMINAL HYSTERECTOMY    . TUBAL LIGATION      Family History  Problem Relation Age of Onset  . Diabetes Mother   . Multiple sclerosis Mother   . Heart disease Father   . Hypertension Sister   . Kidney disease Sister   . Colon cancer Neg Hx     Social History   Tobacco Use  . Smoking status: Current Every Day Smoker    Packs/day: 1.50    Types: Cigarettes  . Smokeless tobacco: Never Used  Substance Use Topics  . Alcohol use: No    Subjective:  Patient presents with concerns for worsening constipation; symptoms started last Wednesday- "just feeling funny through my stomach area." Has prescription for Linzess from her pain management provider due to side effects of Norco; denies any fever; notes that she does not take Linzess regularly; is up to date on colonoscopy; denies any vomiting; no blood in stool;  Also complaining of recurrent sinus symptoms; + facial pressure; + post-nasal drainage; not currently taking Flonase for symptoms; smoking at least 1 ppd; recently had normal CT with ENT;    Objective:  Vitals:   12/16/17 1154  BP: 116/82  Pulse: 65  Temp: 97.8 F (36.6 C)  TempSrc: Oral  SpO2: 98%  Weight: 170 lb 1.9 oz (77.2 kg)  Height: 5\' 8"  (1.727 m)  General: Well developed, well nourished, in no acute distress  Skin : Warm and dry.  Head: Normocephalic and atraumatic  Eyes: Sclera and conjunctiva clear; pupils round and reactive to light; extraocular movements intact  Ears: External normal; canals clear; tympanic membranes congested bilaterally Oropharynx: Pink, supple. No suspicious lesions  Neck: Supple without thyromegaly, adenopathy  Lungs: Respirations unlabored; clear to auscultation bilaterally without wheeze, rales, rhonchi  CVS exam: normal rate and regular rhythm.  Abdomen: Soft; nontender;  nondistended; normoactive bowel sounds; no masses or hepatosplenomegaly  Neurologic: Alert and oriented; speech intact; face symmetrical; moves all extremities well; CNII-XII intact without focal deficit  Assessment:  1. Generalized abdominal pain   2. Chronic frontal sinusitis   3. Elevated LDL cholesterol level     Plan:  1. Suspect constipation; will check X-ray today; check CBC, CMP, U/A today; may need to have patient try OTC glycerin suppository; follow-up to be determined. 2. Refill on Flonase; do not feel antibiotic needed today; stressed need to quit smoking;  3. Check lipid panel per patient request today;  She is encouraged to schedule CPE with her PCP;  No Follow-up on file.  Orders Placed This Encounter  Procedures  . DG Abd 2 Views    Standing Status:   Future    Number of Occurrences:   1    Standing Expiration Date:   02/14/2019    Order Specific Question:   Reason for Exam (SYMPTOM  OR DIAGNOSIS REQUIRED)    Answer:   abdominal pain    Order Specific Question:   Is patient pregnant?    Answer:   No    Order Specific Question:   Preferred imaging location?    Answer:   [facility]    Order Specific Question:   Radiology Contrast Protocol - do NOT remove file path    Answer:   [email or web address]  . CBC w/Diff    Standing Status:   Future    Number of Occurrences:   1    Standing Expiration Date:   12/16/2018  . Comprehensive metabolic panel    Standing Status:   Future    Number of Occurrences:   1    Standing Expiration Date:   12/16/2018  . Urinalysis    Standing Status:   Future    Standing Expiration Date:   12/16/2018  . Lipid panel    Standing Status:   Future    Number of Occurrences:   1    Standing Expiration Date:   12/16/2018    Requested Prescriptions   Signed Prescriptions Disp Refills  . fluticasone (FLONASE) 50 MCG/ACT nasal spray 16 g 6    Sig: Place 2 sprays into both nostrils daily.

## 2017-12-16 NOTE — Patient Instructions (Signed)
Steps to Quit Smoking Smoking tobacco can be bad for your health. It can also affect almost every organ in your body. Smoking puts you and people around you at risk for many serious long-lasting (chronic) diseases. Quitting smoking is hard, but it is one of the best things that you can do for your health. It is never too late to quit. What are the benefits of quitting smoking? When you quit smoking, you lower your risk for getting serious diseases and conditions. They can include:  Lung cancer or lung disease.  Heart disease.  Stroke.  Heart attack.  Not being able to have children (infertility).  Weak bones (osteoporosis) and broken bones (fractures).  If you have coughing, wheezing, and shortness of breath, those symptoms may get better when you quit. You may also get sick less often. If you are pregnant, quitting smoking can help to lower your chances of having a baby of low birth weight. What can I do to help me quit smoking? Talk with your doctor about what can help you quit smoking. Some things you can do (strategies) include:  Quitting smoking totally, instead of slowly cutting back how much you smoke over a period of time.  Going to in-person counseling. You are more likely to quit if you go to many counseling sessions.  Using resources and support systems, such as: ? Online chats with a counselor. ? Phone quitlines. ? Printed self-help materials. ? Support groups or group counseling. ? Text messaging programs. ? Mobile phone apps or applications.  Taking medicines. Some of these medicines may have nicotine in them. If you are pregnant or breastfeeding, do not take any medicines to quit smoking unless your doctor says it is okay. Talk with your doctor about counseling or other things that can help you.  Talk with your doctor about using more than one strategy at the same time, such as taking medicines while you are also going to in-person counseling. This can help make  quitting easier. What things can I do to make it easier to quit? Quitting smoking might feel very hard at first, but there is a lot that you can do to make it easier. Take these steps:  Talk to your family and friends. Ask them to support and encourage you.  Call phone quitlines, reach out to support groups, or work with a counselor.  Ask people who smoke to not smoke around you.  Avoid places that make you want (trigger) to smoke, such as: ? Bars. ? Parties. ? Smoke-break areas at work.  Spend time with people who do not smoke.  Lower the stress in your life. Stress can make you want to smoke. Try these things to help your stress: ? Getting regular exercise. ? Deep-breathing exercises. ? Yoga. ? Meditating. ? Doing a body scan. To do this, close your eyes, focus on one area of your body at a time from head to toe, and notice which parts of your body are tense. Try to relax the muscles in those areas.  Download or buy apps on your mobile phone or tablet that can help you stick to your quit plan. There are many free apps, such as QuitGuide from the CDC (Centers for Disease Control and Prevention). You can find more support from smokefree.gov and other websites.  This information is not intended to replace advice given to you by your health care provider. Make sure you discuss any questions you have with your health care provider. Document Released: 08/11/2009 Document   Revised: 06/12/2016 Document Reviewed: 03/01/2015 Elsevier Interactive Patient Education  2018 Elsevier Inc.  

## 2018-01-16 ENCOUNTER — Other Ambulatory Visit (INDEPENDENT_AMBULATORY_CARE_PROVIDER_SITE_OTHER): Payer: Medicare Other

## 2018-01-16 ENCOUNTER — Encounter: Payer: Self-pay | Admitting: Nurse Practitioner

## 2018-01-16 ENCOUNTER — Ambulatory Visit (INDEPENDENT_AMBULATORY_CARE_PROVIDER_SITE_OTHER): Payer: Medicare Other | Admitting: Nurse Practitioner

## 2018-01-16 VITALS — BP 132/86 | HR 70 | Temp 98.0°F | Resp 16 | Ht 68.0 in | Wt 174.2 lb

## 2018-01-16 DIAGNOSIS — F419 Anxiety disorder, unspecified: Secondary | ICD-10-CM

## 2018-01-16 DIAGNOSIS — Z1231 Encounter for screening mammogram for malignant neoplasm of breast: Secondary | ICD-10-CM | POA: Diagnosis not present

## 2018-01-16 DIAGNOSIS — Z122 Encounter for screening for malignant neoplasm of respiratory organs: Secondary | ICD-10-CM | POA: Diagnosis not present

## 2018-01-16 DIAGNOSIS — F32A Depression, unspecified: Secondary | ICD-10-CM

## 2018-01-16 DIAGNOSIS — Z0001 Encounter for general adult medical examination with abnormal findings: Secondary | ICD-10-CM | POA: Diagnosis not present

## 2018-01-16 DIAGNOSIS — E663 Overweight: Secondary | ICD-10-CM

## 2018-01-16 DIAGNOSIS — F329 Major depressive disorder, single episode, unspecified: Secondary | ICD-10-CM

## 2018-01-16 DIAGNOSIS — Z114 Encounter for screening for human immunodeficiency virus [HIV]: Secondary | ICD-10-CM

## 2018-01-16 LAB — TSH: TSH: 1.49 u[IU]/mL (ref 0.35–4.50)

## 2018-01-16 LAB — HEMOGLOBIN A1C: Hgb A1c MFr Bld: 5.4 % (ref 4.6–6.5)

## 2018-01-16 NOTE — Assessment & Plan Note (Signed)
-  USPSTF grade A and B recommendations reviewed with patient; age-appropriate recommendations, preventive care, screening tests, etc discussed and encouraged; healthy living encouraged; see AVS for patient education given to patient -Discussed importance of 150 minutes of physical activity weekly, eat 6 servings of fruit/vegetables daily and drink plenty of water and avoid sweet beverages.  - Follow up and care instructions discussed and provided in AVS.  -Reviewed Health Maintenance:  - HIV antibody; Future-Screening for HIV (human immunodeficiency virus) - MM DIGITAL SCREENING BILATERAL; Future- Encounter for screening mammogram for breast cancer - Ambulatory referral to Pulmonology-Encounter for screening for lung cancer  Overweight (BMI 25.0-29.9) - Hemoglobin A1c; Future - TSH; Future  Lipid panel WNL 12/16/17 CMET WNL 12/16/17 CBC/diff WNL 12/16/17 UA WNL 12/16/17

## 2018-01-16 NOTE — Progress Notes (Signed)
Name: [name]   MRN: [id number]    DOB: [birth year]   Date:01/16/2018       Progress Note  Subjective   HPI  Patient presents for annual CPE.  Diet: has started watching her intake- wants to lose weight; does eat frequent sweets; drinks gatorade, not much water; fruits/vegetables daily  Exercise: no routine exercise   USPSTF grade A and B recommendations  Anxiety/Depression: not currently maintained on medications for this. Does continue to feel anxious and depressed on a daily basis. We discussed this at her last OV on 12/13 due to her request for xanax refill.  She says she is reluctant to take daily medication, however is also maintained on long-term vicodin, muscle relaxer by pain management, so Rx for hydroxyzine PRN was given for her to try for her anxiety due to high risk associated with continued benzodiazepine use She actually never got the hydroxyzine filled because she says she was scared to try a new medication She went to counseling in the past, which was not helpful for her She reports constant stress and anxiety related to her husbands chronic illness and children are constantly asking her for help. She says her most bothersome symptom is trouble sleeping at night She denies restlessness, mood swings, thoughts of harming herself or others. Depression screen [facility] 2/9 03/22/2016  Decreased Interest 0  Down, Depressed, Hopeless 0  PHQ - 2 Score 0   Hypertension: BP Readings from Last 3 Encounters:  01/16/18 132/86  12/16/17 116/82  10/10/17 118/80   Obesity: Wt Readings from Last 3 Encounters:  01/16/18 174 lb 3.2 oz (79 kg)  12/16/17 170 lb 1.9 oz (77.2 kg)  10/10/17 171 lb (77.6 kg)   BMI Readings from Last 3 Encounters:  01/16/18 26.49 kg/m  12/16/17 25.87 kg/m  10/10/17 26.00 kg/m    Alcohol: no  Tobacco use: daily smoker- thinking of quitting but not ready  HIV: will screen today STD testing and prevention (chl/gon/syphilis): no  concerns Intimate partner violence: feels safe at home Sexual History/Pain during Intercourse: no concerns Menstrual History/LMP/Abnormal Bleeding: denies abnormal vaginal discharge or bleeding Incontinence Symptoms: denies   Vaccinations : up to date  Advanced Care Planning: A voluntary discussion about advance care planning including the explanation and discussion of advance directives.  Discussed health care proxy and Living will, and the patient DOES NOT have a living will at present time. If patient does have living will, I have requested they bring this to the clinic to be scanned in to their chart.  Breast cancer: screening ordered today, last mammo 2017 WNL, she did not F/U in 2018 for mammo Cervical cancer screening: s/p hysterectomy with cervix removal  Lipids:  Lab Results  Component Value Date   CHOL 174 12/16/2017   CHOL 176 03/22/2016   CHOL 175 09/09/2014   Lab Results  Component Value Date   HDL 51.80 12/16/2017   HDL 49.10 03/22/2016   HDL 49.90 09/09/2014   Lab Results  Component Value Date   LDLCALC 101 (H) 12/16/2017   LDLCALC 112 (H) 03/22/2016   LDLCALC 113 (H) 09/09/2014   Lab Results  Component Value Date   TRIG 107.0 12/16/2017   TRIG 72.0 03/22/2016   TRIG 59.0 09/09/2014   Lab Results  Component Value Date   CHOLHDL 3 12/16/2017   CHOLHDL 4 03/22/2016   CHOLHDL 4 09/09/2014   No results found for: LDLDIRECT  Glucose:  Glucose, Bld  Date Value Ref Range Status  12/16/2017  93 70 - 99 mg/dL Final  02/21/2017 104 (H) 65 - 99 mg/dL Final  03/22/2016 90 70 - 99 mg/dL Final   Skin cancer: No concerns today Colorectal cancer: No personal or family history of colon ca, no abdominal pain, no bowel changes, no rectal bleeding   Lung cancer screening: active smoker since age 57, will refer to pulmonology for screening today Aspirin: not indicated ECG: not indicated   Patient Active Problem List   Diagnosis Date Noted  . Orthostasis  02/22/2017  . Allergic rhinitis 02/22/2017  . Medicare annual wellness visit, subsequent 03/22/2016  . Overweight (BMI 25.0-29.9) 03/22/2016  . Sinusitis, chronic 03/17/2015  . Preventative health care 09/09/2014  . Vaginitis and vulvovaginitis 08/20/2014  . Meningitis, candidal 11/25/2012  . Tobacco abuse 11/25/2012  . Anxiety state 04/13/2010  . PREMATURE VENTRICULAR CONTRACTIONS 02/06/2010  . ELECTROCARDIOGRAM, ABNORMAL 02/06/2010  . MIGRAINE HEADACHE 07/24/2007  . GASTROESOPHAGEAL REFLUX DISEASE 07/24/2007  . 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 CHOLECYSTECTOMY, HX OF 07/24/2007    Past Surgical History:  Procedure Laterality Date  . BACK SURGERY  10/23/12  . CHOLECYSTECTOMY    . LAMINECTOMY    . MRI     every 3 mths /due to menigitis  . TOTAL ABDOMINAL HYSTERECTOMY    . TUBAL LIGATION      Family History  Problem Relation Age of Onset  . Diabetes Mother   . Multiple sclerosis Mother   . Heart disease Father   . Hypertension Sister   . Kidney disease Sister   . Colon cancer Neg Hx     Social History   Socioeconomic History  . Marital status: Married    Spouse name: Not on file  . Number of children: 2  . Years of education: 8  . Highest education level: Not on file  Occupational History  . Occupation: Retired/disabled  Social Needs  . Financial resource strain: Not on file  . Food insecurity:    Worry: Not on file    Inability: Not on file  . Transportation needs:    Medical: Not on file    Non-medical: Not on file  Tobacco Use  . Smoking status: Current Every Day Smoker    Packs/day: 1.50    Types: Cigarettes  . Smokeless tobacco: Never Used  Substance and Sexual Activity  . Alcohol use: No  . Drug use: No  . Sexual activity: Yes  Lifestyle  . Physical activity:    Days per week: Not on file    Minutes per session: Not on file  . Stress: Not on file  Relationships  . Social connections:    Talks on phone: Not on  file    Gets together: Not on file    Attends religious service: Not on file    Active member of club or organization: Not on file    Attends meetings of clubs or organizations: Not on file    Relationship status: Not on file  . Intimate partner violence:    Fear of current or ex partner: Not on file    Emotionally abused: Not on file    Physically abused: Not on file    Forced sexual activity: Not on file  Other Topics Concern  . Not on file  Social History Narrative   Occupation: retired [employer]   Married   Alcohol no   Drug use no, no caffiene   Tobacco use- yes quit Dec '08  Regular Exercise no   4 children; reports increased stress in due to caring for grandchildren   UNEMPLOYED      Lives at home w/ her husband   Right-handed   Caffeine: 1 cup of coffee     Current Outpatient Medications:  .  cyclobenzaprine (FLEXERIL) 5 MG tablet, Take 5 mg by mouth 2 (two) times daily as needed for muscle spasms. , Disp: , Rfl:  .  fluticasone (FLONASE) 50 MCG/ACT nasal spray, Place 2 sprays into both nostrils daily., Disp: 16 g, Rfl: 6 .  HYDROcodone-acetaminophen (NORCO) 7.5-325 MG tablet, Take 1 tablet by mouth every 6 (six) hours as needed for moderate pain., Disp: , Rfl:  .  hydrOXYzine (VISTARIL) 50 MG capsule, Take 1 capsule (50 mg total) by mouth 3 (three) times daily as needed., Disp: 30 capsule, Rfl: 0 .  ranitidine (ZANTAC) 150 MG tablet, Take 1 tablet (150 mg total) by mouth 2 (two) times daily. Keep appt w/new provider for future refills, Disp: 60 tablet, Rfl: 1  Allergies  Allergen Reactions  . Anesthesia S-I-40 [Propofol]   . Augmentin [Amoxicillin-Pot Clavulanate] Nausea And Vomiting     ROS  Constitutional: Negative for fever or weight change.  Respiratory: Negative for cough and shortness of breath.   Cardiovascular: Negative for chest pain or palpitations.  Gastrointestinal: Negative for abdominal pain, no bowel changes.   Musculoskeletal: Negative for gait problem or joint swelling. Skin: Negative for rash.  Neurological: Negative for dizziness or headache.  No other specific complaints in a complete review of systems (except as listed in HPI above).  Objective  Vitals:   01/16/18 1116  BP: 132/86  Pulse: 70  Resp: 16  Temp: 98 F (36.7 C)  TempSrc: Oral  SpO2: 95%  Weight: 174 lb 3.2 oz (79 kg)  Height: 5\' 8"  (1.727 m)    Body mass index is 26.49 kg/m.  Physical Exam Vital signs reviewed. Constitutional: Patient appears well-developed and well-nourished. No distress.  HENT: Head: Normocephalic and atraumatic. Ears: B TMs ok, no erythema or effusion; Nose: Nose normal. Mouth/Throat: Oropharynx is clear and moist. No oropharyngeal exudate.  Eyes: Conjunctivae and EOM are normal. Pupils are equal, round, and reactive to light. No scleral icterus.  Neck: Normal range of motion. Neck supple. No cervical adenopathy. No thyromegaly present.  Cardiovascular: Normal rate, regular rhythm and normal heart sounds. No BLE edema. Distal pulses intact. Pulmonary/Chest: Effort normal and breath sounds normal. No respiratory distress. Abdominal: Soft. Bowel sounds are normal, no distension. There is no tenderness. no masses Musculoskeletal: No gross deformities Neurological: She is alert and oriented to person, place, and time. No cranial nerve deficit. Coordination, balance, strength, speech and gait are normal.  Skin: Skin is warm and dry. No rash noted. No erythema.  Psychiatric: Patient has a normal mood and affect. behavior is normal. Judgment and thought content normal.  Assessment & Plan RTC in 1 year for CPE or if needed for anxiety and depression  Anxiety and depression Recommended SSRI trial as first line agent for anxiety and depression as well as referral back to counseling but she declines She is mostly concerned about sleep- we discussed the role of healthy diet, daily exercise in management  of anxiety and depression and healthy sleep habits Recommended OTC melatonin for sleep as she is reluctant to take Rx medications She was strongly encouraged to F/U if her symptoms worsen or do not improve - TSH; Future

## 2018-01-16 NOTE — Patient Instructions (Addendum)
Please head downstairs for lab work.  I have placed a referral to pulmonology for lung cancer screening and breast center for mammogram. Our office will call you to schedule this appointment. You should hear from our office in 7-10 days.  Please work on your diet and exercise as we discussed. Remember half of your plate should be veggies, one-fourth carbs, one-fourth meat, and don't eat meat at every meal. Also, remember to stay away from sugary drinks. I'd like for you to start incorporating exercise into your daily schedule. Start at 10 minutes a day, working up to 30 minutes five times a week.   You may try Melatonin OTC for sleep. Please follow up if your anxiety symptoms dont improve or you start to feel worse.  It was good to see you. Thanks for letting me take care of you today :)   Preventive Care 40-64 Years, Female Preventive care refers to lifestyle choices and visits with your health care provider that can promote health and wellness. What does preventive care include?  A yearly physical exam. This is also called an annual well check.  Dental exams once or twice a year.  Routine eye exams. Ask your health care provider how often you should have your eyes checked.  Personal lifestyle choices, including: ? Daily care of your teeth and gums. ? Regular physical activity. ? Eating a healthy diet. ? Avoiding tobacco and drug use. ? Limiting alcohol use. ? Practicing safe sex. ? Taking low-dose aspirin daily starting at age 6. ? Taking vitamin and mineral supplements as recommended by your health care provider. What happens during an annual well check? The services and screenings done by your health care provider during your annual well check will depend on your age, overall health, lifestyle risk factors, and family history of disease. Counseling Your health care provider may ask you questions about your:  Alcohol use.  Tobacco use.  Drug use.  Emotional  well-being.  Home and relationship well-being.  Sexual activity.  Eating habits.  Work and work Statistician.  Method of birth control.  Menstrual cycle.  Pregnancy history.  Screening You may have the following tests or measurements:  Height, weight, and BMI.  Blood pressure.  Lipid and cholesterol levels. These may be checked every 5 years, or more frequently if you are over 43 years old.  Skin check.  Lung cancer screening. You may have this screening every year starting at age 33 if you have a 30-pack-year history of smoking and currently smoke or have quit within the past 15 years.  Fecal occult blood test (FOBT) of the stool. You may have this test every year starting at age 54.  Flexible sigmoidoscopy or colonoscopy. You may have a sigmoidoscopy every 5 years or a colonoscopy every 10 years starting at age 58.  Hepatitis C blood test.  Hepatitis B blood test.  Sexually transmitted disease (STD) testing.  Diabetes screening. This is done by checking your blood sugar (glucose) after you have not eaten for a while (fasting). You may have this done every 1-3 years.  Mammogram. This may be done every 1-2 years. Talk to your health care provider about when you should start having regular mammograms. This may depend on whether you have a family history of breast cancer.  BRCA-related cancer screening. This may be done if you have a family history of breast, ovarian, tubal, or peritoneal cancers.  Pelvic exam and Pap test. This may be done every 3 years starting at age  21. Starting at age 51, this may be done every 5 years if you have a Pap test in combination with an HPV test.  Bone density scan. This is done to screen for osteoporosis. You may have this scan if you are at high risk for osteoporosis.  Discuss your test results, treatment options, and if necessary, the need for more tests with your health care provider. Vaccines Your health care provider may recommend  certain vaccines, such as:  Influenza vaccine. This is recommended every year.  Tetanus, diphtheria, and acellular pertussis (Tdap, Td) vaccine. You may need a Td booster every 10 years.  Varicella vaccine. You may need this if you have not been vaccinated.  Zoster vaccine. You may need this after age 29.  Measles, mumps, and rubella (MMR) vaccine. You may need at least one dose of MMR if you were born in 1957 or later. You may also need a second dose.  Pneumococcal 13-valent conjugate (PCV13) vaccine. You may need this if you have certain conditions and were not previously vaccinated.  Pneumococcal polysaccharide (PPSV23) vaccine. You may need one or two doses if you smoke cigarettes or if you have certain conditions.  Meningococcal vaccine. You may need this if you have certain conditions.  Hepatitis A vaccine. You may need this if you have certain conditions or if you travel or work in places where you may be exposed to hepatitis A.  Hepatitis B vaccine. You may need this if you have certain conditions or if you travel or work in places where you may be exposed to hepatitis B.  Haemophilus influenzae type b (Hib) vaccine. You may need this if you have certain conditions.  Talk to your health care provider about which screenings and vaccines you need and how often you need them. This information is not intended to replace advice given to you by your health care provider. Make sure you discuss any questions you have with your health care provider. Document Released: 11/11/2015 Document Revised: 07/04/2016 Document Reviewed: 08/16/2015 Elsevier Interactive Patient Education  [employer].

## 2018-01-17 LAB — HIV ANTIBODY (ROUTINE TESTING W REFLEX): HIV 1&2 Ab, 4th Generation: NONREACTIVE

## 2018-02-05 ENCOUNTER — Other Ambulatory Visit: Payer: Self-pay | Admitting: Family

## 2018-02-05 DIAGNOSIS — Z720 Tobacco use: Secondary | ICD-10-CM

## 2018-02-07 ENCOUNTER — Other Ambulatory Visit: Payer: Self-pay | Admitting: Acute Care

## 2018-02-07 DIAGNOSIS — Z122 Encounter for screening for malignant neoplasm of respiratory organs: Secondary | ICD-10-CM

## 2018-02-07 DIAGNOSIS — F17210 Nicotine dependence, cigarettes, uncomplicated: Secondary | ICD-10-CM

## 2018-02-21 ENCOUNTER — Inpatient Hospital Stay: Admission: RE | Admit: 2018-02-21 | Payer: Medicare Other | Source: Ambulatory Visit

## 2018-02-21 ENCOUNTER — Telehealth: Payer: Self-pay | Admitting: Acute Care

## 2018-02-21 ENCOUNTER — Encounter: Payer: Medicare Other | Admitting: Acute Care

## 2018-02-25 NOTE — Telephone Encounter (Signed)
LMTC x 1  

## 2018-02-28 NOTE — Telephone Encounter (Signed)
Spoke with pt and rescheduled [facility] 03/14/18 2:00 CT will be rescheduled. Pt verbalized understanding.  Nothing further needed at this time.

## 2018-03-14 ENCOUNTER — Encounter: Payer: Self-pay | Admitting: Acute Care

## 2018-03-14 ENCOUNTER — Ambulatory Visit (INDEPENDENT_AMBULATORY_CARE_PROVIDER_SITE_OTHER)
Admission: RE | Admit: 2018-03-14 | Discharge: 2018-03-14 | Disposition: A | Discharge status: Home / Self Care | Payer: Medicare Other | Source: Ambulatory Visit | Attending: Acute Care | Admitting: Acute Care

## 2018-03-14 ENCOUNTER — Ambulatory Visit (INDEPENDENT_AMBULATORY_CARE_PROVIDER_SITE_OTHER): Payer: Medicare Other | Admitting: Acute Care

## 2018-03-14 DIAGNOSIS — F17210 Nicotine dependence, cigarettes, uncomplicated: Secondary | ICD-10-CM | POA: Diagnosis not present

## 2018-03-14 DIAGNOSIS — Z122 Encounter for screening for malignant neoplasm of respiratory organs: Secondary | ICD-10-CM

## 2018-03-14 NOTE — Progress Notes (Signed)
Shared Decision Making Visit Lung Cancer Screening Program [phone])   Eligibility:  Age 57 y.o.  Pack Years Smoking History Calculation 36 pack year smoking history (# packs/per year x # years smoked)  Recent History of coughing up blood  no  Unexplained weight loss? no ( >Than 15 pounds within the last 6 months )  Prior History Lung / other cancer no (Diagnosis within the last 5 years already requiring surveillance chest CT Scans).  Smoking Status Current Smoker  Former Smokers: Years since quit: NA  Quit Date: NA  Visit Components:  Discussion included one or more decision making aids. yes  Discussion included risk/benefits of screening. yes  Discussion included potential follow up diagnostic testing for abnormal scans. yes  Discussion included meaning and risk of over diagnosis. yes  Discussion included meaning and risk of False Positives. yes  Discussion included meaning of total radiation exposure. yes  Counseling Included:  Importance of adherence to annual lung cancer LDCT screening. yes  Impact of comorbidities on ability to participate in the program. yes  Ability and willingness to under diagnostic treatment. yes  Smoking Cessation Counseling:  Current Smokers:   Discussed importance of smoking cessation. yes  Information about tobacco cessation classes and interventions provided to patient. yes  Patient provided with "ticket" for LDCT Scan. yes  Symptomatic Patient. no  Counseling  Diagnosis Code: Tobacco Use Z72.0  Asymptomatic Patient yes  Counseling (Intermediate counseling: > three minutes counseling) T6144  Former Smokers:   Discussed the importance of maintaining cigarette abstinence. yes  Diagnosis Code: Personal History of Nicotine Dependence. R15.400  Information about tobacco cessation classes and interventions provided to patient. Yes  Patient provided with "ticket" for LDCT Scan. yes  Written Order for Lung Cancer  Screening with LDCT placed in Epic. Yes (CT Chest Lung Cancer Screening Low Dose W/O CM) [id number] Z12.2-Screening of respiratory organs Z87.891-Personal history of nicotine dependence  I have spent 25 minutes of face to face time with Ms. Hechavarria discussing the risks and benefits of lung cancer screening. We viewed a power point together that explained in detail the above noted topics. We paused at intervals to allow for questions to be asked and answered to ensure understanding.We discussed that the single most powerful action that she can take to decrease her risk of developing lung cancer is to quit smoking. We discussed whether or not she is ready to commit to setting a quit date.She is in the contemplative stage . We discussed options for tools to aid in quitting smoking including nicotine replacement therapy, non-nicotine medications, support groups, Quit Smart classes, and behavior modification. We discussed that often times setting smaller, more achievable goals, such as eliminating 1 cigarette a day for a week and then 2 cigarettes a day for a week can be helpful in slowly decreasing the number of cigarettes smoked. This allows for a sense of accomplishment as well as providing a clinical benefit. I gave her the " Be Stronger Than Your Excuses" card with contact information for community resources, classes, free nicotine replacement therapy, and access to mobile apps, text messaging, and on-line smoking cessation help. I have also given her my card and contact information in the event she needs to contact me. We discussed the time and location of the scan, and that either Doroteo Glassman RN or I will call with the results within 24-48 hours of receiving them. I have offered her  a copy of the power point we viewed  as a resource in  the event they need reinforcement of the concepts we discussed today in the office. The patient verbalized understanding of all of  the above and had no further questions upon  leaving the office. They have my contact information in the event they have any further questions.  I spent 4 minutes counseling on smoking cessation and the health risks of continued tobacco abuse.  I explained to the patient that there has been a high incidence of coronary artery disease noted on these exams. I explained that this is a non-gated exam therefore degree or severity cannot be determined. This patient is not on statin therapy. I have asked the patient to follow-up with their PCP regarding any incidental finding of coronary artery disease and management with diet or medication as their PCP  feels is clinically indicated. The patient verbalized understanding of the above and had no further questions upon completion of the visit.   Magdalen Spatz, NP 03/14/2018 2:53 PM

## 2018-03-19 ENCOUNTER — Other Ambulatory Visit: Payer: Self-pay | Admitting: Acute Care

## 2018-03-19 DIAGNOSIS — F17210 Nicotine dependence, cigarettes, uncomplicated: Secondary | ICD-10-CM

## 2018-03-19 DIAGNOSIS — Z122 Encounter for screening for malignant neoplasm of respiratory organs: Secondary | ICD-10-CM

## 2018-03-20 ENCOUNTER — Telehealth: Payer: Self-pay | Admitting: Nurse Practitioner

## 2018-03-20 ENCOUNTER — Other Ambulatory Visit: Payer: Self-pay | Admitting: Family

## 2018-03-20 DIAGNOSIS — I7090 Unspecified atherosclerosis: Secondary | ICD-10-CM

## 2018-03-20 MED ORDER — ROSUVASTATIN CALCIUM 10 MG PO TABS: 10.0000 mg | ORAL_TABLET | Freq: Every day | ORAL | 1 refills | Status: DC

## 2018-03-20 NOTE — Telephone Encounter (Signed)
There were changes called atherosclerosis on your CT scan. Based on your risk factors and borderline cholesterol testing, I would recommend we start you on a medication called a statin to protect you from heart attack or stroke. I have sent a prescription for crestor 10mg  once daily for you to start. Please stop by the lab in about 6 weeks around the beginning of July for a repeat fasting labs.

## 2018-03-21 ENCOUNTER — Encounter: Payer: Self-pay | Admitting: Nurse Practitioner

## 2018-03-21 NOTE — Telephone Encounter (Signed)
Pt aware of results and aware of med being sent to pharmacy.

## 2018-05-26 ENCOUNTER — Other Ambulatory Visit: Payer: Self-pay

## 2018-05-26 DIAGNOSIS — I7090 Unspecified atherosclerosis: Secondary | ICD-10-CM

## 2018-05-26 MED ORDER — ROSUVASTATIN CALCIUM 10 MG PO TABS: 10.0000 mg | ORAL_TABLET | Freq: Every day | ORAL | 1 refills | Status: DC

## 2018-06-06 ENCOUNTER — Telehealth: Payer: Self-pay | Admitting: *Deleted

## 2018-06-06 DIAGNOSIS — I7090 Unspecified atherosclerosis: Secondary | ICD-10-CM

## 2018-06-06 MED ORDER — ROSUVASTATIN CALCIUM 10 MG PO TABS: 10.0000 mg | ORAL_TABLET | Freq: Every day | ORAL | 1 refills | Status: DC

## 2018-06-06 NOTE — Telephone Encounter (Signed)
Copied from [place] [id number]. Topic: General - Other >> Jun 06, 2018 12:39 PM Carolyn Stare wrote:  Pt call to ask if she is to continue to take the below med and if so she refill  rosuvastatin (CRESTOR) 10 MG tablet  [facility] [address] Linna Hoff [place]    Reviewed chart pt is up-to-date sent refills to pof.Marland KitchenJohny Chess

## 2018-07-19 ENCOUNTER — Ambulatory Visit: Payer: Medicare Other | Admitting: Family Medicine

## 2018-07-19 DIAGNOSIS — Z0289 Encounter for other administrative examinations: Secondary | ICD-10-CM

## 2018-07-21 ENCOUNTER — Ambulatory Visit (INDEPENDENT_AMBULATORY_CARE_PROVIDER_SITE_OTHER): Payer: Medicare Other | Admitting: Internal Medicine

## 2018-07-21 ENCOUNTER — Encounter: Payer: Self-pay | Admitting: Internal Medicine

## 2018-07-21 VITALS — BP 126/84 | HR 75 | Temp 98.4°F | Resp 18 | Ht 68.0 in | Wt 165.0 lb

## 2018-07-21 DIAGNOSIS — J0100 Acute maxillary sinusitis, unspecified: Secondary | ICD-10-CM | POA: Diagnosis not present

## 2018-07-21 MED ORDER — HYDROCODONE-HOMATROPINE 5-1.5 MG/5ML PO SYRP: 5.0000 mL | ORAL_SOLUTION | Freq: Three times a day (TID) | ORAL | 0 refills | Status: DC | PRN

## 2018-07-21 MED ORDER — DOXYCYCLINE HYCLATE 100 MG PO TABS: 100.0000 mg | ORAL_TABLET | Freq: Two times a day (BID) | ORAL | 0 refills | Status: DC

## 2018-07-21 NOTE — Assessment & Plan Note (Signed)
Likely bacterial  Start doxycycline bid x 10 days Prescription cough syrup otc cold medications Rest, fluid Call if no improvement

## 2018-07-21 NOTE — Patient Instructions (Signed)
Take the antibiotic and use the cough syrup as needed.  Continue the flonase and cold medications.    Call if no improvement

## 2018-07-21 NOTE — Progress Notes (Signed)
Subjective:    Patient ID: [name], female    DOB: 01-19-1961, 57 y.o.   MRN: [id number]  HPI She is here for an acute visit for cold symptoms.  Her symptoms started about one month ago.   She is experiencing nasal congestion that is significant associated with sinus pain.  She has had discolored nasal mucus.  She does have a productive cough with the same discolored mucus and has had intermittent wheezing.  She states chills, but no fevers.  She has had some right ear pain at times, but none now.  She states sore throat and significant postnasal drip.  She has nausea, headaches and lightheadedness/dizziness.  She has taken Flonase, decongestants and halls.  She is still smoking-stressed smoking cessation.  Medications and allergies reviewed with patient and updated if appropriate.  Patient Active Problem List   Diagnosis Date Noted  . Orthostasis 02/22/2017  . Allergic rhinitis 02/22/2017  . Encounter for general adult medical examination with abnormal findings 03/22/2016  . Overweight (BMI 25.0-29.9) 03/22/2016  . Sinusitis, chronic 03/17/2015  . Preventative health care 09/09/2014  . Meningitis, candidal 11/25/2012  . Tobacco abuse 11/25/2012  . Anxiety state 04/13/2010  . PREMATURE VENTRICULAR CONTRACTIONS 02/06/2010  . ELECTROCARDIOGRAM, ABNORMAL 02/06/2010  . MIGRAINE HEADACHE 07/24/2007  . GASTROESOPHAGEAL REFLUX DISEASE 07/24/2007  . 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 CHOLECYSTECTOMY, HX OF 07/24/2007    Current Outpatient Medications on File Prior to Visit  Medication Sig Dispense Refill  . cyclobenzaprine (FLEXERIL) 5 MG tablet Take 5 mg by mouth 2 (two) times daily as needed for muscle spasms.     . fluticasone (FLONASE) 50 MCG/ACT nasal spray Place 2 sprays into both nostrils daily. 16 g 6  . HYDROcodone-acetaminophen (NORCO) 7.5-325 MG tablet Take 1 tablet by mouth every 6 (six) hours as needed for moderate pain.    .  hydrOXYzine (VISTARIL) 50 MG capsule Take 1 capsule (50 mg total) by mouth 3 (three) times daily as needed. 30 capsule 0  . ranitidine (ZANTAC) 150 MG tablet Take 1 tablet (150 mg total) by mouth 2 (two) times daily. Keep appt w/new provider for future refills 60 tablet 1  . rosuvastatin (CRESTOR) 10 MG tablet Take 1 tablet (10 mg total) by mouth daily. 90 tablet 1   No current facility-administered medications on file prior to visit.     Past Medical History:  Diagnosis Date  . Chest pain   . Depression   . DJD (degenerative joint disease)   . GERD (gastroesophageal reflux disease)   . History of palpitations   . Migraine headache   . PVC's (premature ventricular contractions)   . 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 BACK SURGERY  10/23/12  . CHOLECYSTECTOMY    . LAMINECTOMY    . MRI     every 3 mths /due to menigitis  . TOTAL ABDOMINAL HYSTERECTOMY    . TUBAL LIGATION      Social History   Socioeconomic History  . Marital status: Married    Spouse name: Not on file  . Number of children: 2  . Years of education: 23  . Highest education level: Not on file  Occupational History  . Occupation: Retired/disabled  Social Needs  . Financial resource strain: Not on file  . Food insecurity:    Worry: Not on file    Inability: Not on file  . Transportation  needs:    Medical: Not on file    Non-medical: Not on file  Tobacco Use  . Smoking status: Current Every Day Smoker    Packs/day: 1.00    Years: 36.00    Pack years: 36.00    Types: Cigarettes  . Smokeless tobacco: Never Used  Substance and Sexual Activity  . Alcohol use: No  . Drug use: No  . Sexual activity: Yes  Lifestyle  . Physical activity:    Days per week: Not on file    Minutes per session: Not on file  . Stress: Not on file  Relationships  . Social connections:    Talks on phone: Not on file    Gets together: Not on file    Attends religious  service: Not on file    Active member of club or organization: Not on file    Attends meetings of clubs or organizations: Not on file    Relationship status: Not on file  Other Topics Concern  . Not on file  Social History Narrative   Occupation: retired [employer]   Married   Alcohol no   Drug use no, no caffiene   Tobacco use- yes quit Dec '08   Regular Exercise no   4 children; reports increased stress in due to caring for grandchildren   UNEMPLOYED      Lives at home w/ her husband   Right-handed   Caffeine: 1 cup of coffee    Family History  Problem Relation Age of Onset  . Diabetes Mother   . Multiple sclerosis Mother   . Heart disease Father   . Hypertension Sister   . Kidney disease Sister   . Colon cancer Neg Hx     Review of Systems  Constitutional: Positive for chills. Negative for fever.  HENT: Positive for congestion (with discolored mucus), ear pain (right ear pain - improved), postnasal drip, sinus pain and sore throat.   Respiratory: Positive for cough (productive - yellow - light green) and wheezing. Negative for shortness of breath.   Gastrointestinal: Positive for nausea.  Neurological: Positive for dizziness, light-headedness and headaches.       Objective:   Vitals:   07/21/18 1537  BP: 126/84  Pulse: 75  Resp: 18  Temp: 98.4 F (36.9 C)  SpO2: 96%   Filed Weights   07/21/18 1537  Weight: 165 lb (74.8 kg)   Body mass index is 25.09 kg/m.  Wt Readings from Last 3 Encounters:  07/21/18 165 lb (74.8 kg)  01/16/18 174 lb 3.2 oz (79 kg)  12/16/17 170 lb 1.9 oz (77.2 kg)     Physical Exam GENERAL APPEARANCE: Mildly ill appearing, NAD EYES: conjunctiva clear, no icterus HEENT: bilateral tympanic membranes and ear canals normal, oropharynx with moderate erythema, sinus tenderness with palpation bilaterally, nasal congestion, no thyromegaly, trachea midline, no cervical or supraclavicular lymphadenopathy LUNGS:  Clear to auscultation without wheeze or crackles, unlabored breathing, good air entry bilaterally CARDIOVASCULAR: Normal S1,S2 without murmurs, no edema SKIN: warm, dry        Assessment & Plan:   See Problem List for Assessment and Plan of chronic medical problems.

## 2018-07-25 ENCOUNTER — Telehealth: Payer: Self-pay | Admitting: Nurse Practitioner

## 2018-07-25 NOTE — Telephone Encounter (Deleted)
LVM for patient to make appt

## 2018-07-25 NOTE — Telephone Encounter (Signed)
Okay with me 

## 2018-07-25 NOTE — Telephone Encounter (Signed)
Copied from [place] [id number]. Topic: Appointment Scheduling - Scheduling Inquiry for Clinic >> Jul 25, 2018  2:39 PM [name] wrote: Reason for CRM: Pt is requesting Dr. Ronnald Ramp nurse to call. She was a pt of Dr. Linda Hedges and is now seeing NP. She states her 4 sisters see Dr. Ronnald Ramp and is hoping he will accept her as a patient as well. 3 of them are [name], [name]. She states that she needs an MD not an NP. Please call at [phone] to notify pt.

## 2018-07-25 NOTE — Telephone Encounter (Signed)
Please advise on transfer.  °

## 2018-07-27 NOTE — Telephone Encounter (Signed)
Yes, I will see her 

## 2018-07-29 NOTE — Telephone Encounter (Signed)
appt made

## 2018-08-21 ENCOUNTER — Ambulatory Visit: Payer: Medicare Other | Admitting: Internal Medicine

## 2018-08-29 ENCOUNTER — Other Ambulatory Visit: Payer: Self-pay | Admitting: Physical Medicine and Rehabilitation

## 2018-09-01 ENCOUNTER — Other Ambulatory Visit: Payer: Self-pay | Admitting: Physical Medicine and Rehabilitation

## 2018-09-01 DIAGNOSIS — R51 Headache: Principal | ICD-10-CM

## 2018-09-01 DIAGNOSIS — R519 Headache, unspecified: Secondary | ICD-10-CM

## 2018-09-03 ENCOUNTER — Ambulatory Visit: Payer: Medicare Other | Admitting: Internal Medicine

## 2018-09-03 DIAGNOSIS — Z0289 Encounter for other administrative examinations: Secondary | ICD-10-CM

## 2018-10-09 ENCOUNTER — Ambulatory Visit (INDEPENDENT_AMBULATORY_CARE_PROVIDER_SITE_OTHER): Payer: Medicare Other | Admitting: Internal Medicine

## 2018-10-09 ENCOUNTER — Encounter: Payer: Self-pay | Admitting: Internal Medicine

## 2018-10-09 VITALS — BP 116/76 | HR 89 | Temp 98.0°F | Ht 68.0 in | Wt 166.0 lb

## 2018-10-09 DIAGNOSIS — J309 Allergic rhinitis, unspecified: Secondary | ICD-10-CM | POA: Diagnosis not present

## 2018-10-09 DIAGNOSIS — F411 Generalized anxiety disorder: Secondary | ICD-10-CM | POA: Diagnosis not present

## 2018-10-09 DIAGNOSIS — J069 Acute upper respiratory infection, unspecified: Secondary | ICD-10-CM

## 2018-10-09 MED ORDER — HYDROCODONE-HOMATROPINE 5-1.5 MG/5ML PO SYRP: 5.0000 mL | ORAL_SOLUTION | Freq: Three times a day (TID) | ORAL | 0 refills | Status: DC | PRN

## 2018-10-09 MED ORDER — AZITHROMYCIN 250 MG PO TABS: ORAL_TABLET | ORAL | 1 refills | Status: DC

## 2018-10-09 NOTE — Assessment & Plan Note (Signed)
stable overall by history and exam, recent data reviewed with pt, and pt to continue medical treatment as before,  to f/u any worsening symptoms or concerns  

## 2018-10-09 NOTE — Assessment & Plan Note (Addendum)
Mild to mod, for antibx course, cough med prn, mucinex otc prn,  to f/u any worsening symptoms or concerns

## 2018-10-09 NOTE — Patient Instructions (Signed)
Please take all new medication as prescribed - the antibiotic, and the cough medicine as needed  Please continue all other medications as before, and refills have been done if requested.  Please have the pharmacy call with any other refills you may need.  Please keep your appointments with your specialists as you may have planned

## 2018-10-09 NOTE — Progress Notes (Signed)
Subjective:    Patient ID: [name], female    DOB: 02-Jun-1961, 57 y.o.   MRN: [id number]  HPI   Here with 2-3 days acute onset fever, facial pain, pressure, headache, general weakness and malaise, and greenish d/c, with mild ST and cough, but pt denies chest pain, wheezing, increased sob or doe, orthopnea, PND, increased LE swelling, palpitations, dizziness or syncope.  Pt denies new neurological symptoms such as new headache, or facial or extremity weakness or numbness   Pt denies polydipsia, polyuria Past Medical History:  Diagnosis Date  . Chest pain   . Depression   . DJD (degenerative joint disease)   . GERD (gastroesophageal reflux disease)   . History of palpitations   . Migraine headache   . PVC's (premature ventricular contractions)   . 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 BACK SURGERY  10/23/12  . CHOLECYSTECTOMY    . LAMINECTOMY    . MRI     every 3 mths /due to menigitis  . TOTAL ABDOMINAL HYSTERECTOMY    . TUBAL LIGATION      reports that she has been smoking cigarettes. She has a 36.00 pack-year smoking history. She has never used smokeless tobacco. She reports that she does not drink alcohol or use drugs. family history includes Diabetes in her mother; Heart disease in her father; Hypertension in her sister; Kidney disease in her sister; Multiple sclerosis in her mother. Allergies  Allergen Reactions  . Anesthesia S-I-40 [Propofol]   . Augmentin [Amoxicillin-Pot Clavulanate] Nausea And Vomiting   Current Outpatient Medications on File Prior to Visit  Medication Sig Dispense Refill  . cyclobenzaprine (FLEXERIL) 5 MG tablet Take 5 mg by mouth 2 (two) times daily as needed for muscle spasms.     Marland Kitchen doxycycline (VIBRA-TABS) 100 MG tablet Take 1 tablet (100 mg total) by mouth 2 (two) times daily. 20 tablet 0  . fluticasone (FLONASE) 50 MCG/ACT nasal spray Place 2 sprays into both nostrils daily. 16 g 6  .  HYDROcodone-acetaminophen (NORCO) 7.5-325 MG tablet Take 1 tablet by mouth every 6 (six) hours as needed for moderate pain.    . hydrOXYzine (VISTARIL) 50 MG capsule Take 1 capsule (50 mg total) by mouth 3 (three) times daily as needed. 30 capsule 0  . ranitidine (ZANTAC) 150 MG tablet Take 1 tablet (150 mg total) by mouth 2 (two) times daily. Keep appt w/new provider for future refills 60 tablet 1  . rosuvastatin (CRESTOR) 10 MG tablet Take 1 tablet (10 mg total) by mouth daily. 90 tablet 1   No current facility-administered medications on file prior to visit.    Review of Systems  Constitutional: Negative for other unusual diaphoresis or sweats HENT: Negative for ear discharge or swelling Eyes: Negative for other worsening visual disturbances Respiratory: Negative for stridor or other swelling  Gastrointestinal: Negative for worsening distension or other blood Genitourinary: Negative for retention or other urinary change Musculoskeletal: Negative for other MSK pain or swelling Skin: Negative for color change or other new lesions Neurological: Negative for worsening tremors and other numbness  Psychiatric/Behavioral: Negative for worsening agitation or other fatigue All other system neg per pt    Objective:   Physical Exam BP 116/76   Pulse 89   Temp 98 F (36.7 C) (Oral)   Ht 5\' 8"  (1.727 m)   Wt 166 lb (75.3 kg)   SpO2 96%   BMI 25.24 kg/m  VS  noted, mild ill Constitutional: Pt appears in NAD HENT: Head: NCAT.  Right Ear: External ear normal.  Left Ear: External ear normal.  Eyes: . Pupils are equal, round, and reactive to light. Conjunctivae and EOM are normal Bilat tm's with mild erythema.  Max sinus areas mild tender.  Pharynx with mild erythema, no exudate Nose: without d/c or deformity Neck: Neck supple. Gross normal ROM Cardiovascular: Normal rate and regular rhythm.   Pulmonary/Chest: Effort normal and breath sounds decreased without rales or wheezing.    Neurological: Pt is alert. At baseline orientation, motor grossly intact Skin: Skin is warm. No rashes, other new lesions, no LE edema Psychiatric: Pt behavior is normal without agitation , mild nervous No other exam findings Lab Results  Component Value Date   WBC 7.3 12/16/2017   HGB 14.2 12/16/2017   HCT 43.1 12/16/2017   PLT 389.0 12/16/2017   GLUCOSE 93 12/16/2017   CHOL 174 12/16/2017   TRIG 107.0 12/16/2017   HDL 51.80 12/16/2017   LDLCALC 101 (H) 12/16/2017   ALT 17 12/16/2017   AST 14 12/16/2017   NA 139 12/16/2017   K 4.7 12/16/2017   CL 104 12/16/2017   CREATININE 0.73 12/16/2017   BUN 10 12/16/2017   CO2 27 12/16/2017   TSH 1.49 01/16/2018   HGBA1C 5.4 01/16/2018       Assessment & Plan:

## 2018-10-09 NOTE — Assessment & Plan Note (Signed)
Mild situational, overall stable,  to f/u any worsening symptoms or concerns

## 2019-03-25 ENCOUNTER — Ambulatory Visit (INDEPENDENT_AMBULATORY_CARE_PROVIDER_SITE_OTHER): Payer: Medicare Other | Admitting: Family

## 2019-03-25 ENCOUNTER — Encounter: Payer: Self-pay | Admitting: Family

## 2019-03-25 ENCOUNTER — Other Ambulatory Visit: Payer: Self-pay

## 2019-03-25 ENCOUNTER — Ambulatory Visit: Payer: Self-pay | Admitting: Nurse Practitioner

## 2019-03-25 DIAGNOSIS — Z720 Tobacco use: Secondary | ICD-10-CM | POA: Diagnosis not present

## 2019-03-25 DIAGNOSIS — J019 Acute sinusitis, unspecified: Secondary | ICD-10-CM

## 2019-03-25 MED ORDER — DOXYCYCLINE HYCLATE 100 MG PO TABS: 100.0000 mg | ORAL_TABLET | Freq: Two times a day (BID) | ORAL | 0 refills | Status: DC

## 2019-03-25 MED ORDER — FLUTICASONE PROPIONATE 50 MCG/ACT NA SUSP: 2.0000 | Freq: Every day | NASAL | 6 refills | Status: DC

## 2019-03-25 NOTE — Telephone Encounter (Signed)
Pt. C/o sinus pain and pressure 3-4 days. No fever. Has a headache, mild cough. Treated in December for sinus infection and had a refill on a Z-Pack. Filled that, but not sure if she should take it.Warm transfer to [place] in the practice. Answer Assessment - Initial Assessment Questions 1. LOCATION: "Where does it hurt?"      Pain face, headache 2. ONSET: "When did the sinus pain start?"  (e.g., hours, days)      3-4 days ago 3. SEVERITY: "How bad is the pain?"   (Scale 1-10; mild, moderate or severe)   - MILD (1-3): doesn't interfere with normal activities    - MODERATE (4-7): interferes with normal activities (e.g., work or school) or awakens from sleep   - SEVERE (8-10): excruciating pain and patient unable to do any normal activities        Severe 4. RECURRENT SYMPTOM: "Have you ever had sinus problems before?" If so, ask: "When was the last time?" and "What happened that time?"      Yes 5. NASAL CONGESTION: "Is the nose blocked?" If so, ask, "Can you open it or must you breathe through the mouth?"     Yes 6. NASAL DISCHARGE: "Do you have discharge from your nose?" If so ask, "What color?"     White 7. FEVER: "Do you have a fever?" If so, ask: "What is it, how was it measured, and when did it start?"      No 8. OTHER SYMPTOMS: "Do you have any other symptoms?" (e.g., sore throat, cough, earache, difficulty breathing)     Cough 9. PREGNANCY: "Is there any chance you are pregnant?" "When was your last menstrual period?"     No  Protocols used: SINUS PAIN OR CONGESTION-A-AH

## 2019-03-25 NOTE — Progress Notes (Signed)
[name] is a 58 y.o. female with the following history as recorded in [employer]:  Patient Active Problem List   Diagnosis Date Noted  . Orthostasis 02/22/2017  . Allergic rhinitis 02/22/2017  . Encounter for general adult medical examination with abnormal findings 03/22/2016  . Overweight (BMI 25.0-29.9) 03/22/2016  . Sinusitis, chronic 03/17/2015  . Preventative health care 09/09/2014  . Meningitis, candidal 11/25/2012  . Tobacco abuse 11/25/2012  . Anxiety state 04/13/2010  . PREMATURE VENTRICULAR CONTRACTIONS 02/06/2010  . ELECTROCARDIOGRAM, ABNORMAL 02/06/2010  . Acute upper respiratory infection 10/20/2008  . MIGRAINE HEADACHE 07/24/2007  . GASTROESOPHAGEAL REFLUX DISEASE 07/24/2007  . 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 CHOLECYSTECTOMY, HX OF 07/24/2007    Current Outpatient Medications  Medication Sig Dispense Refill  . cyclobenzaprine (FLEXERIL) 5 MG tablet Take 5 mg by mouth 2 (two) times daily as needed for muscle spasms.     Marland Kitchen doxycycline (VIBRA-TABS) 100 MG tablet Take 1 tablet (100 mg total) by mouth 2 (two) times daily. 20 tablet 0  . fluticasone (FLONASE) 50 MCG/ACT nasal spray Place 2 sprays into both nostrils daily. 16 g 6  . HYDROcodone-acetaminophen (NORCO) 7.5-325 MG tablet Take 1 tablet by mouth every 6 (six) hours as needed for moderate pain.    . hydrOXYzine (VISTARIL) 50 MG capsule Take 1 capsule (50 mg total) by mouth 3 (three) times daily as needed. 30 capsule 0  . ranitidine (ZANTAC) 150 MG tablet Take 1 tablet (150 mg total) by mouth 2 (two) times daily. Keep appt w/new provider for future refills 60 tablet 1  . rosuvastatin (CRESTOR) 10 MG tablet Take 1 tablet (10 mg total) by mouth daily. 90 tablet 1   No current facility-administered medications for this visit.     Allergies: Anesthesia s-i-40 [propofol] and Augmentin [amoxicillin-pot clavulanate]  Past Medical History:  Diagnosis Date  . Chest pain   . Depression    . DJD (degenerative joint disease)   . GERD (gastroesophageal reflux disease)   . History of palpitations   . Migraine headache   . PVC's (premature ventricular contractions)   . 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 BACK SURGERY  10/23/12  . CHOLECYSTECTOMY    . LAMINECTOMY    . MRI     every 3 mths /due to menigitis  . TOTAL ABDOMINAL HYSTERECTOMY    . TUBAL LIGATION      Family History  Problem Relation Age of Onset  . Diabetes Mother   . Multiple sclerosis Mother   . Heart disease Father   . Hypertension Sister   . Kidney disease Sister   . Colon cancer Neg Hx     Social History   Tobacco Use  . Smoking status: Current Every Day Smoker    Packs/day: 1.00    Years: 36.00    Pack years: 36.00    Types: Cigarettes  . Smokeless tobacco: Never Used  Substance Use Topics  . Alcohol use: No    Subjective:    I connected with [name] on 03/25/19 at 12:00 PM EDT by a video enabled telemedicine application and verified that I am speaking with the correct person using two identifiers. Patient and I are the only 2 people on the video call.    I discussed the limitations of evaluation and management by telemedicine and the availability of in person appointments. The patient expressed understanding and agreed to proceed.  Patient is concerned for sinus infection; + frontal sinus pain; symptoms x 4-5 days; prone to recurrent sinus infections; not currently taking Flonase or any preventive medication; + smoker; has started using OTC Sinus rinse with some relief today; no fever, no cough or shortness of breath.       Objective:  There were no vitals filed for this visit.  General: Well developed, well nourished, in no acute distress  Head: Normocephalic and atraumatic  Lungs: Respirations unlabored;  Neurologic: Alert and oriented; speech intact; face symmetrical;   Assessment:  1. Acute sinusitis,  recurrence not specified, unspecified location   2. Tobacco abuse     Plan:  Rx for Doxycycline 100 mg bid x 10 days, Flonase NS; encouraged to use Flonase regularly to help prevent infections; stressed need to quit smoking; She is due for CPE with her PCP- will have someone from our office reach out to schedule.   No follow-ups on file.  No orders of the defined types were placed in this encounter.   Requested Prescriptions   Signed Prescriptions Disp Refills  . fluticasone (FLONASE) 50 MCG/ACT nasal spray 16 g 6    Sig: Place 2 sprays into both nostrils daily.  Marland Kitchen doxycycline (VIBRA-TABS) 100 MG tablet 20 tablet 0    Sig: Take 1 tablet (100 mg total) by mouth 2 (two) times daily.

## 2019-03-31 ENCOUNTER — Encounter: Payer: Self-pay | Admitting: Internal Medicine

## 2019-03-31 ENCOUNTER — Other Ambulatory Visit: Payer: Self-pay

## 2019-03-31 ENCOUNTER — Other Ambulatory Visit (INDEPENDENT_AMBULATORY_CARE_PROVIDER_SITE_OTHER): Payer: Medicare Other

## 2019-03-31 ENCOUNTER — Ambulatory Visit (INDEPENDENT_AMBULATORY_CARE_PROVIDER_SITE_OTHER): Payer: Medicare Other | Admitting: Internal Medicine

## 2019-03-31 VITALS — BP 136/80 | HR 84 | Temp 98.0°F | Ht 68.0 in | Wt 158.0 lb

## 2019-03-31 DIAGNOSIS — J309 Allergic rhinitis, unspecified: Secondary | ICD-10-CM | POA: Diagnosis not present

## 2019-03-31 DIAGNOSIS — E7800 Pure hypercholesterolemia, unspecified: Secondary | ICD-10-CM | POA: Diagnosis not present

## 2019-03-31 DIAGNOSIS — I700 Atherosclerosis of aorta: Secondary | ICD-10-CM

## 2019-03-31 DIAGNOSIS — Z1231 Encounter for screening mammogram for malignant neoplasm of breast: Secondary | ICD-10-CM | POA: Insufficient documentation

## 2019-03-31 DIAGNOSIS — Z0000 Encounter for general adult medical examination without abnormal findings: Secondary | ICD-10-CM | POA: Diagnosis not present

## 2019-03-31 DIAGNOSIS — R0609 Other forms of dyspnea: Secondary | ICD-10-CM

## 2019-03-31 DIAGNOSIS — Z0001 Encounter for general adult medical examination with abnormal findings: Secondary | ICD-10-CM

## 2019-03-31 DIAGNOSIS — K219 Gastro-esophageal reflux disease without esophagitis: Secondary | ICD-10-CM

## 2019-03-31 DIAGNOSIS — R0600 Dyspnea, unspecified: Secondary | ICD-10-CM

## 2019-03-31 DIAGNOSIS — R9431 Abnormal electrocardiogram [ECG] [EKG]: Secondary | ICD-10-CM

## 2019-03-31 DIAGNOSIS — E785 Hyperlipidemia, unspecified: Secondary | ICD-10-CM | POA: Insufficient documentation

## 2019-03-31 DIAGNOSIS — Z720 Tobacco use: Secondary | ICD-10-CM

## 2019-03-31 LAB — LIPID PANEL
Cholesterol: 175 mg/dL (ref 0–200)
HDL: 63.3 mg/dL (ref >39.00)
LDL Cholesterol: 95 mg/dL (ref 0–99)
NonHDL: 111.43
Total CHOL/HDL Ratio: 3
Triglycerides: 82 mg/dL (ref 0.0–149.0)
VLDL: 16.4 mg/dL (ref 0.0–40.0)

## 2019-03-31 LAB — COMPREHENSIVE METABOLIC PANEL
ALT: 16 U/L (ref 0–35)
AST: 15 U/L (ref 0–37)
Albumin: 4.1 g/dL (ref 3.5–5.2)
Alkaline Phosphatase: 76 U/L (ref 39–117)
BUN: 10 mg/dL (ref 6–23)
CO2: 26 mEq/L (ref 19–32)
Calcium: 9.5 mg/dL (ref 8.4–10.5)
Chloride: 104 mEq/L (ref 96–112)
Creatinine, Ser: 0.71 mg/dL (ref 0.40–1.20)
GFR: 102.31 mL/min (ref >60.00)
Glucose, Bld: 111 mg/dL — ABNORMAL HIGH (ref 70–99)
Potassium: 3.5 mEq/L (ref 3.5–5.1)
Sodium: 139 mEq/L (ref 135–145)
Total Bilirubin: 0.9 mg/dL (ref 0.2–1.2)
Total Protein: 7.9 g/dL (ref 6.0–8.3)

## 2019-03-31 LAB — CBC WITH DIFFERENTIAL/PLATELET
Basophils Absolute: 0.1 10*3/uL (ref 0.0–0.1)
Basophils Relative: 1.2 % (ref 0.0–3.0)
Eosinophils Absolute: 0.1 10*3/uL (ref 0.0–0.7)
Eosinophils Relative: 0.8 % (ref 0.0–5.0)
HCT: 40.2 % (ref 36.0–46.0)
Hemoglobin: 13.5 g/dL (ref 12.0–15.0)
Lymphocytes Relative: 48.8 % — ABNORMAL HIGH (ref 12.0–46.0)
Lymphs Abs: 3.8 10*3/uL (ref 0.7–4.0)
MCHC: 33.5 g/dL (ref 30.0–36.0)
MCV: 89.4 fl (ref 78.0–100.0)
Monocytes Absolute: 0.4 10*3/uL (ref 0.1–1.0)
Monocytes Relative: 5.1 % (ref 3.0–12.0)
Neutro Abs: 3.5 10*3/uL (ref 1.4–7.7)
Neutrophils Relative %: 44.1 % (ref 43.0–77.0)
Platelets: 346 10*3/uL (ref 150.0–400.0)
RBC: 4.5 Mil/uL (ref 3.87–5.11)
RDW: 13.2 % (ref 11.5–15.5)
WBC: 7.9 10*3/uL (ref 4.0–10.5)

## 2019-03-31 LAB — TSH: TSH: 1.58 u[IU]/mL (ref 0.35–4.50)

## 2019-03-31 LAB — BRAIN NATRIURETIC PEPTIDE: Pro B Natriuretic peptide (BNP): 50 pg/mL (ref 0.0–100.0)

## 2019-03-31 MED ORDER — METHYLPREDNISOLONE 4 MG PO TBPK: ORAL_TABLET | ORAL | 0 refills | Status: AC

## 2019-03-31 MED ORDER — LEVOCETIRIZINE DIHYDROCHLORIDE 5 MG PO TABS: 5.0000 mg | ORAL_TABLET | Freq: Every evening | ORAL | 1 refills | Status: DC

## 2019-03-31 NOTE — Progress Notes (Signed)
Subjective:  Patient ID: [name], female    DOB: June 07, 1961  Age: 58 y.o. MRN: [id number]  CC: Annual Exam and Hyperlipidemia   HPI [name] presents for a CPX.  She complains of a several year history of trouble with her sinuses.  She saw ENT about a year or 2 ago and nothing serious was found.  She recently saw other providers and has been treated with this with doxycycline and Zithromax.  None of that has helped.  Her recent course of doxycycline caused nausea and dry heaves so she stopped taking it.  She complains of popping in her ears, pressure over her nose, dizziness, nasal congestion, clear phlegm from her nose and postnasal drip.  She has not gotten much symptom relief with a combination of Flonase, chlorpheniramine, acetaminophen, and phenylephrine.  She was prescribed a statin about a year ago but she has not been taking it recently.  Outpatient Medications Prior to Visit  Medication Sig Dispense Refill  . fluticasone (FLONASE) 50 MCG/ACT nasal spray Place 2 sprays into both nostrils daily. 16 g 6  . cyclobenzaprine (FLEXERIL) 5 MG tablet Take 5 mg by mouth 2 (two) times daily as needed for muscle spasms.     Marland Kitchen HYDROcodone-acetaminophen (NORCO) 7.5-325 MG tablet Take 1 tablet by mouth every 6 (six) hours as needed for moderate pain.    . hydrOXYzine (VISTARIL) 50 MG capsule Take 1 capsule (50 mg total) by mouth 3 (three) times daily as needed. 30 capsule 0  . ranitidine (ZANTAC) 150 MG tablet Take 1 tablet (150 mg total) by mouth 2 (two) times daily. Keep appt w/new provider for future refills 60 tablet 1  . rosuvastatin (CRESTOR) 10 MG tablet Take 1 tablet (10 mg total) by mouth daily. 90 tablet 1  . doxycycline (VIBRA-TABS) 100 MG tablet Take 1 tablet (100 mg total) by mouth 2 (two) times daily. (Patient not taking: Reported on 03/31/2019) 20 tablet 0   No facility-administered medications prior to visit.     ROS Review of Systems  Constitutional: Negative  for chills, diaphoresis, fatigue and unexpected weight change.  HENT: Positive for congestion, postnasal drip and rhinorrhea. Negative for ear pain, facial swelling, nosebleeds, sinus pressure, sinus pain, sneezing, sore throat, tinnitus and trouble swallowing.   Eyes: Negative.   Respiratory: Positive for shortness of breath (DOE for about 2 years). Negative for cough, chest tightness and wheezing.   Cardiovascular: Negative for chest pain, palpitations and leg swelling.  Gastrointestinal: Negative for abdominal pain, diarrhea, nausea and vomiting.  Endocrine: Negative.   Genitourinary: Negative.  Negative for difficulty urinating.  Musculoskeletal: Negative.  Negative for arthralgias and myalgias.  Skin: Negative.  Negative for color change, pallor and rash.  Neurological: Negative.  Negative for dizziness and weakness.  Hematological: Negative for adenopathy. Does not bruise/bleed easily.  Psychiatric/Behavioral: Negative.     Objective:  BP 136/80 (BP Location: Left Arm, Patient Position: Sitting, Cuff Size: Normal)   Pulse 84   Temp 98 F (36.7 C) (Oral)   Ht 5\' 8"  (1.727 m)   Wt 158 lb (71.7 kg)   SpO2 98%   BMI 24.02 kg/m   BP Readings from Last 3 Encounters:  03/31/19 136/80  10/09/18 116/76  07/21/18 126/84    Wt Readings from Last 3 Encounters:  03/31/19 158 lb (71.7 kg)  10/09/18 166 lb (75.3 kg)  07/21/18 165 lb (74.8 kg)    Physical Exam Vitals signs reviewed.  Constitutional:  Appearance: She is not ill-appearing or diaphoretic.  HENT:     Nose: Mucosal edema present. No congestion or rhinorrhea.     Right Nostril: No epistaxis.     Left Nostril: No epistaxis.     Right Turbinates: Not enlarged, swollen or pale.     Left Turbinates: Not enlarged, swollen or pale.     Right Sinus: No maxillary sinus tenderness or frontal sinus tenderness.     Left Sinus: No maxillary sinus tenderness or frontal sinus tenderness.     Mouth/Throat:     Pharynx:  Oropharynx is clear.  Eyes:     General: No scleral icterus.    Conjunctiva/sclera: Conjunctivae normal.  Neck:     Musculoskeletal: Normal range of motion. No neck rigidity.  Cardiovascular:     Rate and Rhythm: Normal rate and regular rhythm.     Heart sounds: No murmur.  Pulmonary:     Effort: Pulmonary effort is normal.     Breath sounds: No stridor. No wheezing, rhonchi or rales.  Chest:     Comments: EKG---  Sinus  Rhythm  -Left atrial enlargement.   BORDERLINE- no change from the prior EKG Abdominal:     General: Abdomen is flat.     Palpations: There is no hepatomegaly, splenomegaly or mass.     Tenderness: There is no abdominal tenderness. There is no guarding.     Hernia: No hernia is present.  Genitourinary:    Comments: GU, breast, rectal exams were deferred at her request. Musculoskeletal: Normal range of motion.     Right lower leg: No edema.     Left lower leg: No edema.  Lymphadenopathy:     Cervical: No cervical adenopathy.  Skin:    General: Skin is warm and dry.  Neurological:     General: No focal deficit present.  Psychiatric:        Mood and Affect: Mood normal.        Behavior: Behavior normal.     Lab Results  Component Value Date   WBC 7.9 03/31/2019   HGB 13.5 03/31/2019   HCT 40.2 03/31/2019   PLT 346.0 03/31/2019   GLUCOSE 111 (H) 03/31/2019   CHOL 175 03/31/2019   TRIG 82.0 03/31/2019   HDL 63.30 03/31/2019   LDLCALC 95 03/31/2019   ALT 16 03/31/2019   AST 15 03/31/2019   NA 139 03/31/2019   K 3.5 03/31/2019   CL 104 03/31/2019   CREATININE 0.71 03/31/2019   BUN 10 03/31/2019   CO2 26 03/31/2019   TSH 1.58 03/31/2019   HGBA1C 5.4 01/16/2018    Ct Chest Lung Ca Screen Low Dose W/o Cm  Result Date: 03/17/2018 CLINICAL DATA:  58 year old female current smoker with 36 pack year history of smoking. Lung cancer screening examination. EXAM: CT CHEST WITHOUT CONTRAST LOW-DOSE FOR LUNG CANCER SCREENING TECHNIQUE: Multidetector CT  imaging of the chest was performed following the standard protocol without IV contrast. COMPARISON:  Chest CT 05/28/2010. FINDINGS: Cardiovascular: Heart size is normal. There is no significant pericardial fluid, thickening or pericardial calcification. Aortic atherosclerosis. No definite coronary artery calcifications. Mediastinum/Nodes: No pathologically enlarged mediastinal or hilar lymph nodes. Please note that accurate exclusion of hilar adenopathy is limited on noncontrast CT scans. Esophagus is unremarkable in appearance. No axillary lymphadenopathy. Lungs/Pleura: Small right upper lobe pulmonary nodule (axial image 101 of series 3), with a volume derived mean diameter of only 2.6 mm. No larger more suspicious appearing pulmonary nodules or masses are noted.  No acute consolidative airspace disease. No pleural effusions. Mild diffuse bronchial wall thickening with very mild centrilobular and paraseptal emphysema. Upper Abdomen: Status post cholecystectomy. Musculoskeletal: Orthopedic fixation hardware in the lower cervical spine. There are no aggressive appearing lytic or blastic lesions noted in the visualized portions of the skeleton. IMPRESSION: 1. Lung-RADS 2, benign appearance or behavior. Continue annual screening with low-dose chest CT without contrast in 12 months. 2. Mild diffuse bronchial wall thickening with very mild centrilobular and paraseptal emphysema; imaging findings suggestive of underlying COPD. 3. Aortic atherosclerosis. Aortic Atherosclerosis (ICD10-I70.0) and Emphysema (ICD10-J43.9). Electronically Signed   By: Vinnie Langton M.D.   On: 03/17/2018 09:05    Assessment & Plan:   Kana was seen today for annual exam and hyperlipidemia.  Diagnoses and all orders for this visit:  DOE (dyspnea on exertion)- She has developed DOE but no cardiac symptoms.  She had a CT scan done about a year ago that did not show coronary atherosclerosis.  Her EKG is unchanged over the last 2 years.   I do not think her shortness of breath is cardiac in nature.  I think it is related to her tobacco abuse and the early development of COPD.  She does not want to use an inhaler at this time. -     EKG 12-Lead -     CBC with Differential/Platelet; Future -     Brain natriuretic peptide; Future  Allergic rhinitis, unspecified seasonality, unspecified trigger -     levocetirizine (XYZAL) 5 MG tablet; Take 1 tablet (5 mg total) by mouth every evening. -     methylPREDNISolone (MEDROL DOSEPAK) 4 MG TBPK tablet; TAKE AS DIRECTED  Gastroesophageal reflux disease without esophagitis- She is asymptomatic with respect to this.  She is not anemic.  She does not request that this be treated. -     CBC with Differential/Platelet; Future  ELECTROCARDIOGRAM, ABNORMAL  Encounter for general adult medical examination with abnormal findings  Preventative health care  Atherosclerosis of aorta [facility])- She was asked to quit smoking. -     Lipid panel; Future  Elevated LDL cholesterol level- She has a low ASCVD risk score so I do not recommend a statin. -     Comprehensive metabolic panel; Future -     Lipid panel; Future -     TSH; Future  Visit for screening mammogram -     MM DIGITAL SCREENING BILATERAL; Future  Tobacco abuse -     Ambulatory Referral for Lung Cancer Scre   I have discontinued Vinnie C. Haddix's cyclobenzaprine, HYDROcodone-acetaminophen, ranitidine, hydrOXYzine, rosuvastatin, and doxycycline. I am also having her start on levocetirizine and methylPREDNISolone. Additionally, I am having her maintain her fluticasone.  Meds ordered this encounter  Medications  . levocetirizine (XYZAL) 5 MG tablet    Sig: Take 1 tablet (5 mg total) by mouth every evening.    Dispense:  90 tablet    Refill:  1  . methylPREDNISolone (MEDROL DOSEPAK) 4 MG TBPK tablet    Sig: TAKE AS DIRECTED    Dispense:  21 tablet    Refill:  0     Follow-up: Return in about 3 months (around 07/01/2019).   Scarlette Calico, MD

## 2019-03-31 NOTE — Patient Instructions (Signed)

## 2019-04-21 ENCOUNTER — Other Ambulatory Visit: Payer: Self-pay | Admitting: *Deleted

## 2019-04-21 DIAGNOSIS — Z87891 Personal history of nicotine dependence: Secondary | ICD-10-CM

## 2019-04-21 DIAGNOSIS — Z122 Encounter for screening for malignant neoplasm of respiratory organs: Secondary | ICD-10-CM

## 2019-04-21 DIAGNOSIS — F17210 Nicotine dependence, cigarettes, uncomplicated: Secondary | ICD-10-CM

## 2019-04-29 ENCOUNTER — Ambulatory Visit
Admission: RE | Admit: 2019-04-29 | Discharge: 2019-04-29 | Disposition: A | Discharge status: Home / Self Care | Payer: Medicare Other | Source: Ambulatory Visit | Attending: Internal Medicine | Admitting: Internal Medicine

## 2019-04-29 ENCOUNTER — Other Ambulatory Visit: Payer: Self-pay

## 2019-04-29 DIAGNOSIS — Z1231 Encounter for screening mammogram for malignant neoplasm of breast: Secondary | ICD-10-CM

## 2019-04-30 LAB — HM MAMMOGRAPHY

## 2019-05-06 ENCOUNTER — Telehealth: Payer: Self-pay | Admitting: Internal Medicine

## 2019-05-06 NOTE — Telephone Encounter (Signed)
Left message for patient to call office to schedule AWV. SF °

## 2019-06-03 ENCOUNTER — Ambulatory Visit (INDEPENDENT_AMBULATORY_CARE_PROVIDER_SITE_OTHER)
Admission: RE | Admit: 2019-06-03 | Discharge: 2019-06-03 | Disposition: A | Discharge status: Home / Self Care | Payer: Medicare Other | Source: Ambulatory Visit | Attending: Acute Care | Admitting: Acute Care

## 2019-06-03 ENCOUNTER — Other Ambulatory Visit: Payer: Self-pay

## 2019-06-03 DIAGNOSIS — Z87891 Personal history of nicotine dependence: Secondary | ICD-10-CM | POA: Diagnosis not present

## 2019-06-03 DIAGNOSIS — Z122 Encounter for screening for malignant neoplasm of respiratory organs: Secondary | ICD-10-CM

## 2019-06-03 DIAGNOSIS — F17210 Nicotine dependence, cigarettes, uncomplicated: Secondary | ICD-10-CM | POA: Diagnosis not present

## 2019-06-08 ENCOUNTER — Telehealth: Payer: Self-pay

## 2019-06-08 NOTE — Telephone Encounter (Signed)
Copied from [place] [id number]. Topic: Quick Communication - See Telephone Encounter >> Jun 08, 2019 11:10 AM Loma [place] wrote: CRM for notification. See Telephone encounter for: 06/08/19. Pt wants Dr Ronnald Ramp nurse to call her about Kidney test she had last month. She thinks she should be urinating more frequently  pls cb at [phone]

## 2019-06-09 NOTE — Telephone Encounter (Signed)
Called number listed for pt. Phone went start to vm and vm box was full and unable to leave a message.

## 2019-06-10 ENCOUNTER — Encounter: Payer: Self-pay | Admitting: *Deleted

## 2019-06-10 ENCOUNTER — Other Ambulatory Visit: Payer: Self-pay | Admitting: *Deleted

## 2019-06-10 DIAGNOSIS — F17210 Nicotine dependence, cigarettes, uncomplicated: Secondary | ICD-10-CM

## 2019-06-10 DIAGNOSIS — Z87891 Personal history of nicotine dependence: Secondary | ICD-10-CM

## 2019-06-10 DIAGNOSIS — Z122 Encounter for screening for malignant neoplasm of respiratory organs: Secondary | ICD-10-CM

## 2019-06-22 ENCOUNTER — Ambulatory Visit (INDEPENDENT_AMBULATORY_CARE_PROVIDER_SITE_OTHER): Payer: Medicare Other | Admitting: Internal Medicine

## 2019-06-22 ENCOUNTER — Other Ambulatory Visit: Payer: Self-pay

## 2019-06-22 ENCOUNTER — Encounter: Payer: Self-pay | Admitting: Internal Medicine

## 2019-06-22 VITALS — BP 124/84 | HR 71 | Temp 98.0°F | Resp 16 | Ht 68.0 in | Wt 155.0 lb

## 2019-06-22 DIAGNOSIS — I700 Atherosclerosis of aorta: Secondary | ICD-10-CM

## 2019-06-22 DIAGNOSIS — E785 Hyperlipidemia, unspecified: Secondary | ICD-10-CM | POA: Diagnosis not present

## 2019-06-22 DIAGNOSIS — Z23 Encounter for immunization: Secondary | ICD-10-CM

## 2019-06-22 DIAGNOSIS — F411 Generalized anxiety disorder: Secondary | ICD-10-CM | POA: Diagnosis not present

## 2019-06-22 MED ORDER — ATORVASTATIN CALCIUM 10 MG PO TABS: 10.0000 mg | ORAL_TABLET | Freq: Every day | ORAL | 1 refills | Status: DC

## 2019-06-22 MED ORDER — ALPRAZOLAM 0.5 MG PO TABS: 0.5000 mg | ORAL_TABLET | Freq: Every evening | ORAL | 2 refills | Status: DC | PRN

## 2019-06-22 NOTE — Progress Notes (Addendum)
Subjective:  Patient ID: [name], female    DOB: 04-09-1961  Age: 58 y.o. MRN: [id number]  CC: Hyperlipidemia   HPI [name] presents for f/up - She recently underwent a low-dose CT of the chest to screen for lung cancer.  She is concerned that there was evidence of atherosclerosis in her aorta.  She wants to take a statin for CV risk reduction.  She also complains of intermittent episodes of anxiety and panic.  She wants a prescription for Xanax.  She is not willing to take an SSRI.  Outpatient Medications Prior to Visit  Medication Sig Dispense Refill  . fluticasone (FLONASE) 50 MCG/ACT nasal spray Place 2 sprays into both nostrils daily. 16 g 6  . levocetirizine (XYZAL) 5 MG tablet Take 1 tablet (5 mg total) by mouth every evening. 90 tablet 1  . LINZESS 145 MCG CAPS capsule TK 1 C PO QD     No facility-administered medications prior to visit.     ROS Review of Systems  Constitutional: Negative.  Negative for diaphoresis and fatigue.  HENT: Negative.   Eyes: Negative for visual disturbance.  Respiratory: Negative for cough, chest tightness, shortness of breath and wheezing.   Cardiovascular: Negative for chest pain, palpitations and leg swelling.  Gastrointestinal: Negative for abdominal pain, constipation, diarrhea, nausea and vomiting.  Endocrine: Negative.   Genitourinary: Negative.  Negative for difficulty urinating.  Musculoskeletal: Negative.  Negative for arthralgias and myalgias.  Skin: Negative.   Neurological: Negative for dizziness, weakness and light-headedness.  Hematological: Negative for adenopathy. Does not bruise/bleed easily.  Psychiatric/Behavioral: Negative.     Objective:  BP 124/84 (BP Location: Left Arm, Patient Position: Sitting, Cuff Size: Normal)   Pulse 71   Temp 98 F (36.7 C) (Oral)   Resp 16   Ht 5\' 8"  (1.727 m)   Wt 155 lb (70.3 kg)   SpO2 99%   BMI 23.57 kg/m   BP Readings from Last 3 Encounters:  06/22/19 124/84   03/31/19 136/80  10/09/18 116/76    Wt Readings from Last 3 Encounters:  06/22/19 155 lb (70.3 kg)  03/31/19 158 lb (71.7 kg)  10/09/18 166 lb (75.3 kg)    Physical Exam Vitals signs reviewed.  Constitutional:      Appearance: She is not ill-appearing or diaphoretic.  HENT:     Nose: Nose normal.     Mouth/Throat:     Pharynx: No oropharyngeal exudate.  Eyes:     General: No scleral icterus.    Conjunctiva/sclera: Conjunctivae normal.  Neck:     Musculoskeletal: Normal range of motion. No neck rigidity or muscular tenderness.  Cardiovascular:     Rate and Rhythm: Normal rate and regular rhythm.     Heart sounds: No murmur.  Pulmonary:     Effort: Pulmonary effort is normal.     Breath sounds: No stridor. No wheezing, rhonchi or rales.  Abdominal:     General: Abdomen is flat. There is no distension.     Palpations: There is no hepatomegaly, splenomegaly or mass.     Tenderness: There is no abdominal tenderness.  Musculoskeletal: Normal range of motion.  Lymphadenopathy:     Cervical: No cervical adenopathy.  Skin:    General: Skin is warm.  Neurological:     General: No focal deficit present.     Mental Status: She is alert and oriented to person, place, and time. Mental status is at baseline.     Lab Results  Component Value Date   WBC 7.9 03/31/2019   HGB 13.5 03/31/2019   HCT 40.2 03/31/2019   PLT 346.0 03/31/2019   GLUCOSE 111 (H) 03/31/2019   CHOL 175 03/31/2019   TRIG 82.0 03/31/2019   HDL 63.30 03/31/2019   LDLCALC 95 03/31/2019   ALT 16 03/31/2019   AST 15 03/31/2019   NA 139 03/31/2019   K 3.5 03/31/2019   CL 104 03/31/2019   CREATININE 0.71 03/31/2019   BUN 10 03/31/2019   CO2 26 03/31/2019   TSH 1.58 03/31/2019   HGBA1C 5.4 01/16/2018    Ct Chest Lung Ca Screen Low Dose W/o Cm  Result Date: 06/04/2019 CLINICAL DATA:  58 year old asymptomatic female current smoker with 37 pack-year smoking history. EXAM: CT CHEST WITHOUT CONTRAST LOW-DOSE  FOR LUNG CANCER SCREENING TECHNIQUE: Multidetector CT imaging of the chest was performed following the standard protocol without IV contrast. COMPARISON:  03/14/2018 screening chest CT. FINDINGS: Cardiovascular: Normal heart size. No significant pericardial effusion/thickening. Mildly atherosclerotic nonaneurysmal thoracic aorta. Normal caliber pulmonary arteries. Mediastinum/Nodes: No discrete thyroid nodules. Unremarkable esophagus. No pathologically enlarged axillary, mediastinal or hilar lymph nodes, noting limited sensitivity for the detection of hilar adenopathy on this noncontrast study. Lungs/Pleura: No pneumothorax. No pleural effusion. Mild centrilobular emphysema. No acute consolidative airspace disease or lung masses. No significant growth of previously visualized tiny pulmonary nodule. No new significant pulmonary nodules. Upper abdomen: Cholecystectomy. Musculoskeletal: No aggressive appearing focal osseous lesions. Surgical hardware from [employer] is partially visualized in the lower cervical spine. IMPRESSION: Lung-RADS 2, benign appearance or behavior. Continue annual screening with low-dose chest CT without contrast in 12 months. Aortic Atherosclerosis (ICD10-I70.0) and Emphysema (ICD10-J43.9). Electronically Signed   By: Ilona Sorrel M.D.   On: 06/04/2019 08:59    Assessment & Plan:   [name] was seen today for hyperlipidemia.  Diagnoses and all orders for this visit:  Need for influenza vaccination -     Flu Vaccine QUAD 36+ mos IM  Atherosclerosis of aorta ([place])- She will start taking a statin for CV risk reduction.  She also agrees to stop smoking. -     atorvastatin (LIPITOR) 10 MG tablet; Take 1 tablet (10 mg total) by mouth daily.  GAD (generalized anxiety disorder)- She will take Xanax as needed.  She is not willing to take an SSRI. -     ALPRAZolam (XANAX) 0.5 MG tablet; Take 1 tablet (0.5 mg total) by mouth at bedtime as needed for anxiety.  Dyslipidemia, goal LDL below 100 -      atorvastatin (LIPITOR) 10 MG tablet; Take 1 tablet (10 mg total) by mouth daily.   I am having [name] start on ALPRAZolam and atorvastatin. I am also having her maintain her fluticasone, levocetirizine, and Linzess.  Meds ordered this encounter  Medications  . ALPRAZolam (XANAX) 0.5 MG tablet    Sig: Take 1 tablet (0.5 mg total) by mouth at bedtime as needed for anxiety.    Dispense:  30 tablet    Refill:  2  . atorvastatin (LIPITOR) 10 MG tablet    Sig: Take 1 tablet (10 mg total) by mouth daily.    Dispense:  90 tablet    Refill:  1     Follow-up: No follow-ups on file.  Laura Calico, MD

## 2019-06-22 NOTE — Patient Instructions (Signed)

## 2019-09-07 ENCOUNTER — Ambulatory Visit: Payer: Self-pay

## 2019-09-07 NOTE — Telephone Encounter (Signed)
Per Stef, patient would need to be seen virtually or at urgent care.  Called patient and informed. She will go to urgent care to be seen.

## 2019-09-07 NOTE — Telephone Encounter (Signed)
Patient called stating that she has been dizzy off and on for about 1 week.  She states that her sinuses are stuffy.  She is on Xyzal for her sinus issues. She states  That her rt side of her nose is very painful to touch just under the corner of her eye. She has nasal drainage that is yellow to clear.  She also states that she is having yeast symptoms in her vaginal area. She denies fever and other symptoms.  She states she has not been exposed to COVID-19. She states she has onging sinus issues that have been Dx as allergies. Care advice read to patient. She verbalized understanding. Call transferred to office for scheduling.   Reason for Disposition . Face pain present > 24 hours  Answer Assessment - Initial Assessment Questions 1. DESCRIPTION: "Describe your dizziness."     lighheaded 2. LIGHTHEADED: "Do you feel lightheaded?" (e.g., somewhat faint, woozy, weak upon standing)     no 3. VERTIGO: "Do you feel like either you or the room is spinning or tilting?" (i.e. vertigo)     no 4. SEVERITY: "How bad is it?"  "Do you feel like you are going to faint?" "Can you stand and walk?"   - MILD - walking normally   - MODERATE - interferes with normal activities (e.g., work, school)    - SEVERE - unable to stand, requires support to walk, feels like passing out now.     mild 5. ONSET:  "When did the dizziness begin?"     1 week 6. AGGRAVATING FACTORS: "Does anything make it worse?" (e.g., standing, change in head position)     unsure 7. HEART RATE: "Can you tell me your heart rate?" "How many beats in 15 seconds?"  (Note: not all patients can do this)      unsure 8. CAUSE: "What do you think is causing the dizziness?"     Unsure possible sinus 9. RECURRENT SYMPTOM: "Have you had dizziness before?" If so, ask: "When was the last time?" "What happened that time?"    yes 10. OTHER SYMPTOMS: "Do you have any other symptoms?" (e.g., fever, chest pain, vomiting, diarrhea, bleeding)      no 11.  PREGNANCY: "Is there any chance you are pregnant?" "When was your last menstrual period?"      N/A  Answer Assessment - Initial Assessment Questions 1. ONSET: "When did the pain start?" (e.g., minutes, hours, days)     1 week 2. ONSET: "Does the pain come and go, or has it been constant since it started?" (e.g., constant, intermittent, fleeting)    Getting worse 3. SEVERITY: "How bad is the pain?"   (Scale 1-10; mild, moderate or severe)   - MILD (1-3): doesn't interfere with normal activities    - MODERATE (4-7): interferes with normal activities or awakens from sleep    - SEVERE (8-10): excruciating pain, unable to do any normal activities     6-7 4. LOCATION: "Where does it hurt?"     Below eye at nose mostly right 5. RASH: "Is there any redness, rash, or swelling of the face?"     no 6. FEVER: "Do you have a fever?" If so, ask: "What is it, how was it measured, and when did it start?"      no 7. OTHER SYMPTOMS: "Do you have any other symptoms?" (e.g., fever, toothache, nasal discharge, nasal congestion, clicking sensation in jaw joint)    Nasal congestion and drainage yellow tint 8. PREGNANCY: "  Is there any chance you are pregnant?" "When was your last menstrual period?"     N/A  Protocols used: FACE PAIN-A-AH, [place]

## 2019-09-08 ENCOUNTER — Ambulatory Visit: Payer: Medicare Other | Admitting: Internal Medicine

## 2019-10-06 ENCOUNTER — Other Ambulatory Visit: Payer: Self-pay

## 2019-10-06 DIAGNOSIS — Z20822 Contact with and (suspected) exposure to covid-19: Secondary | ICD-10-CM

## 2019-10-08 LAB — NOVEL CORONAVIRUS, NAA: SARS-CoV-2, NAA: NOT DETECTED

## 2019-10-22 IMAGING — DX DG ABDOMEN 2V
2 series · 2 of 2 positions shown · non-contrast
Comparison: Abdominal x-ray dated February 21, 2017.

CLINICAL DATA: Lower abdominal pain and constipation.

EXAM:
ABDOMEN - 2 VIEW

[abdomen erect]
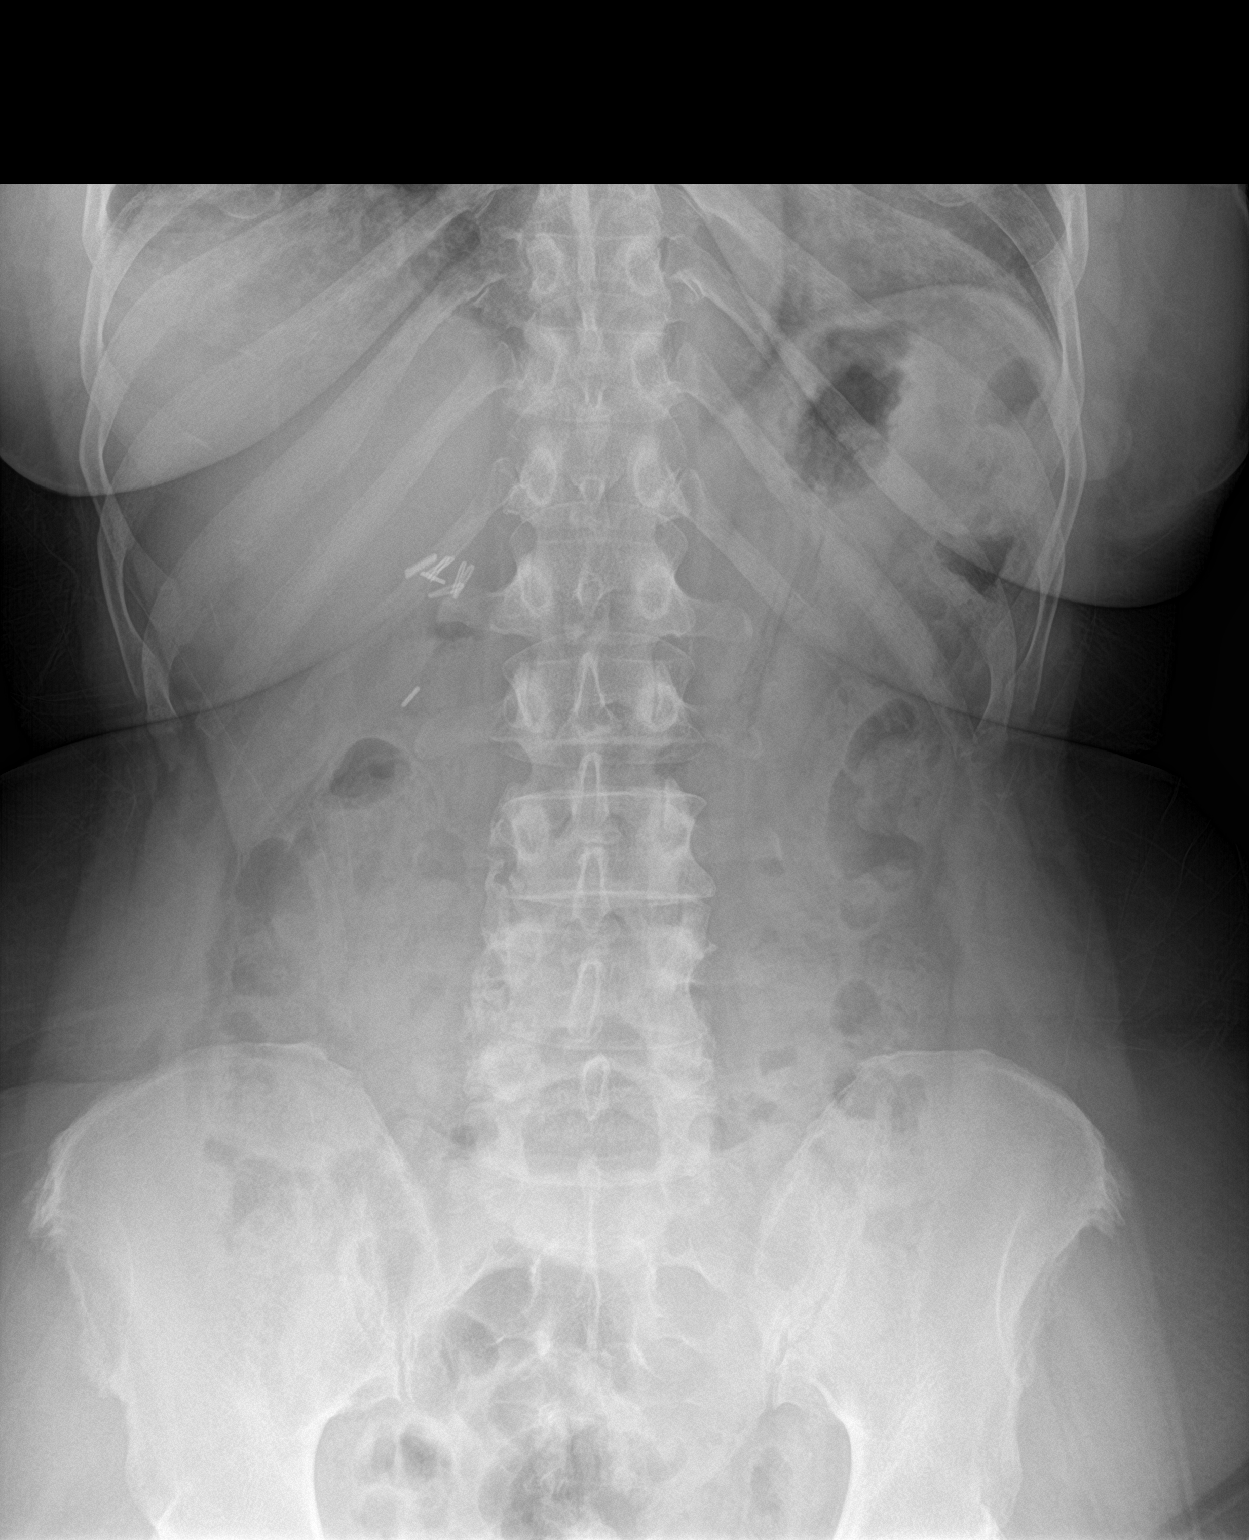

[abdomen supine]
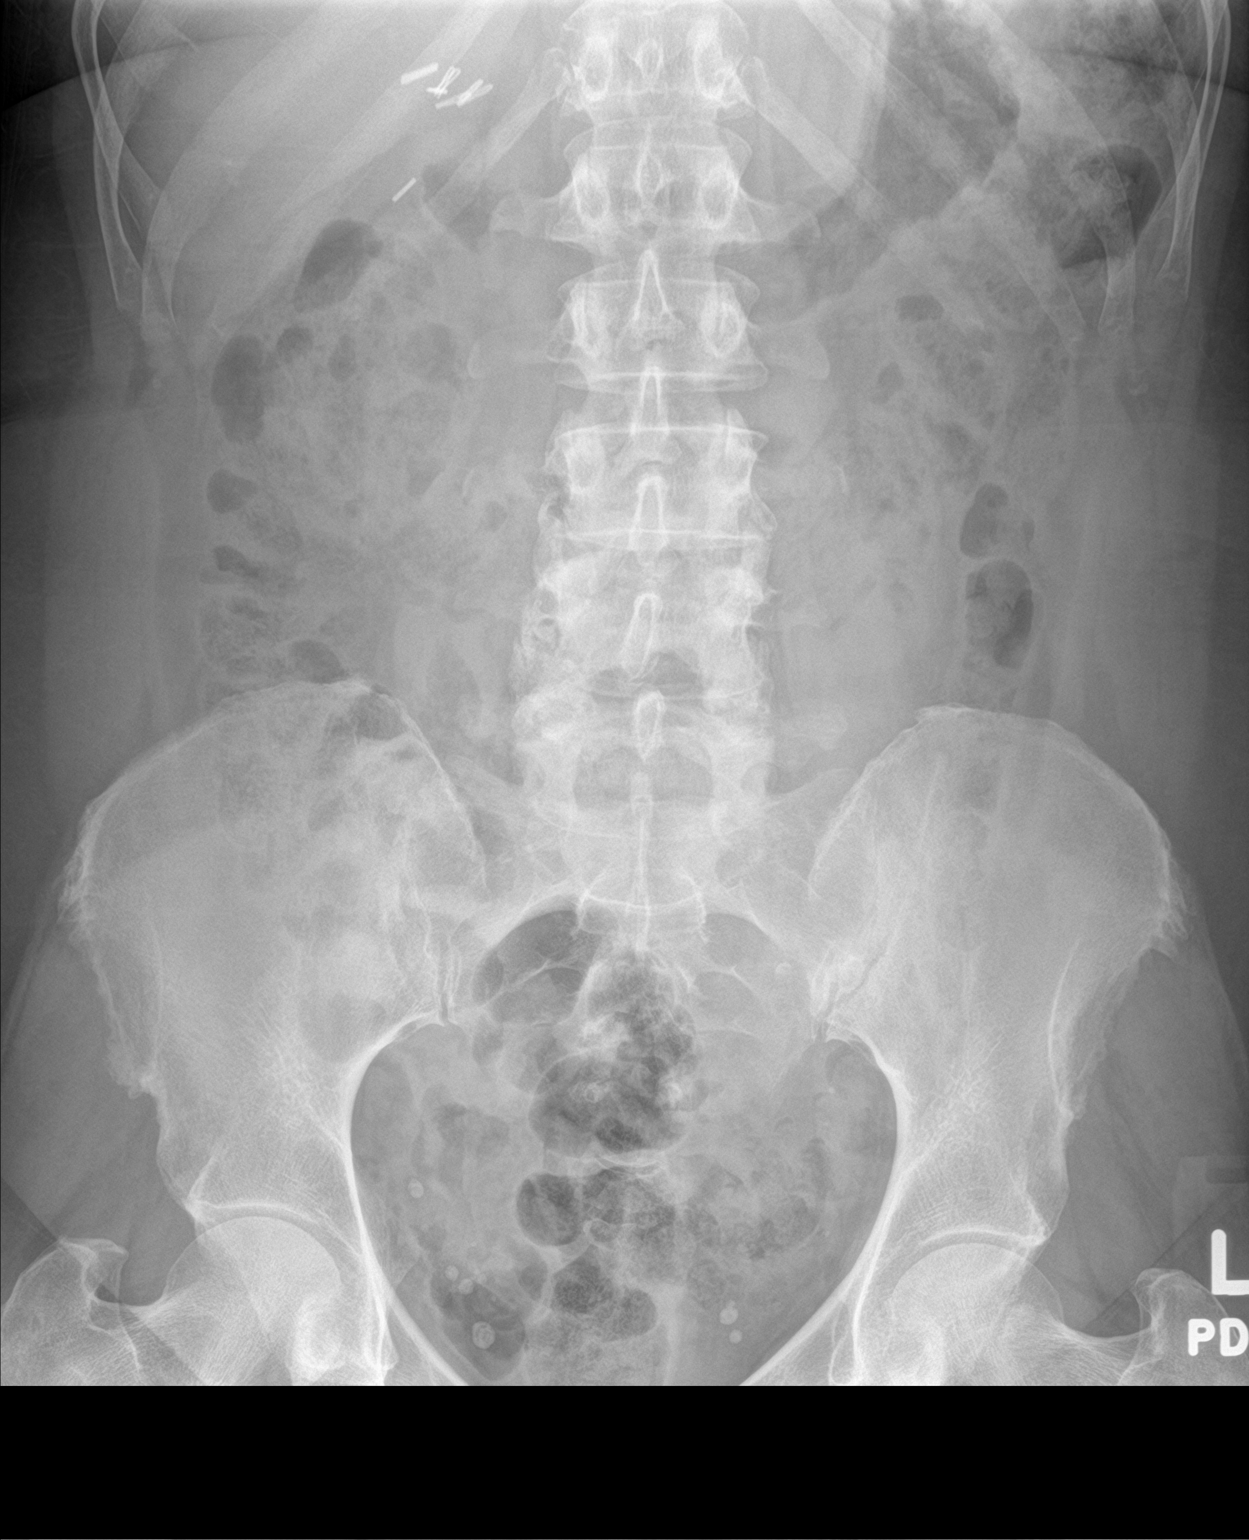

[2 of 2 positions shown; findings below may reference images not displayed]

FINDINGS: The bowel gas pattern is normal. Moderate stool burden throughout
the colon. There is no evidence of free air. No radio-opaque calculi
or other significant radiographic abnormality is seen. Prior
cholecystectomy. Unchanged phleboliths in the pelvis.
IMPRESSION: Moderate colonic stool burden.  No obstruction.

## 2019-11-06 ENCOUNTER — Other Ambulatory Visit: Payer: Self-pay | Admitting: Internal Medicine

## 2019-11-06 DIAGNOSIS — J309 Allergic rhinitis, unspecified: Secondary | ICD-10-CM

## 2020-01-04 NOTE — Progress Notes (Signed)
[facility]    Provider:  Dr Jaynee Eagles Referring Provider: Philbert Riser, MD Primary Care Physician:  Janith Lima, MD  CC:  Pituitary lesion and headaches  HPI 01/04/2020: [name] is a 59 year old female who I saw in the past for neck pain and headaches.  Today she is being requested that we evaluate her for trigeminal nerve disorder.  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 Krista Blue for injections.  At that time she was seen for migraines.  Today she has a new chief complaint trigeminal nerve disorder as requested by Philbert Riser, MD from [facility]. Patient is here alone, it starts I the back of the head she point to the occipital area, it crawls to the right side of the face, It feels like a thousand needles, shooting, searing, brief. It started a year ago. It is getting worse, happening, she was sent to a neurosurgeon here. She continues to have neck pain, I sent her to Dr. Krista Blue for botox but she didn't go because she is afraid of needles. It is severe. Pain medication doesn't help. Happens multiple times a day. Doesn't know what triggers it. Started a year ago. Worsening over the year. Severe, burning, searing. Starts at the back of there head not in the face. Sometimes she feels numb.   I reviewed requesting provider notes: patient is there for chronic neck, low back right upper and right lower extremity pain, associated with muscle spasms and paresthesias in the hand, the pain is constant, aching stabbing, aggravated by prolonged standing and improved by heat, intermittent numbness of the right side of her face for about 6 months, the symptoms have been more frequent over the last few weeks, she is on multiple medications for pain.  Bilateral upper extremity EMGs nerve conduction study no evidence of ulnar neuropathy or cervical radiculopathy, I reviewed examination which showed 5 out of 5 strength in the  upper extremities, sensation symmetrical in the upper extremities, cranial nerves grossly intact except for decreased sensation over the right side of the face.  This was diagnosed as a trigeminal nerve issue. She has gabapentin 300mg  three times a day. She declines PT.   A review of Epic chart showed MRI in the past was unremarkable, as below.  Review of her labs show that she had a CMP in June 2020 which was unremarkable except for slightly elevated glucose, essentially normal CBC, TSH 1.58.   Personally reviewed images and agree with the following MRi brain 05/04/2017:    This MRI of the brain with and without contrast with additional postcontrast pituitary views shows the following: 1.   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   It is not clearly present on the standard brain MRI from 2008 and appears unchanged when compared to the T2-weighted sagittal images from the 2014 cervical spine MRI. 2.   Single small T2/flair hyperintense focus in the external capsule on the left that was also noted on the 2008 MRI.  Though nonspecific it could represent a small focus of chronic microvascular ischemic change. 3.   There are no acute findings and there is a normal enhancement pattern.    HP 6/2018I:  [name] is a  female here as a referral from Dr. Ronnald Ramp for neck pain and headaches. 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 She was seeing Doreene Nest with [facility] had been following her for white spots on her brain that never changes (likely microvascular ischemia). She is not aware of ever having a pituitary lesion (MRI report in 09/2011 made no mention). She is post menopause, no leakage from breasts, no hyperactivity or fatigue. She has  vision changes with migraines but she also has vision changes of the left eye  Peripheral and laterally sometimes when she is looking at something for a period of time trying to focus but brief and not continuous and more when focusing and turning. She is having worsening headaches, from the neck radiating into the head from the neck. Never had a headache with vomiting or nausea but she does get some light sensitivity. Migraines are once a month for 15 minutes and may be more sinus related she has a lot of sinus problems. No other focal neurologic deficits, associated symptoms, inciting events or modifiable factors. No unexplained weight changes, she feels tired and has temperature regulation issues due to menopause currently.   Reviewed notes, labs and imaging from outside physicians, which showed:   Patient has Flexeril, hydrocodone acetaminophen, alprazolam, and ranitidine on her med list. Reviewed notes from [facility]. Patient presented there with neck pain. The patient reported symptoms involving the entire neck left side and left arm being bothered by 2 which began years ago in 1999. Symptoms began without any known injury. It involves neck stiffness, muscle spasm, crepitus, tenderness, shoulder pain, tingling, arm numbness, upper extremity weakness and headache. Pain radiates to the left shoulder left arm and forearm left hand right trapezius. Pain is aching. Moderate in severity of pain. Rates it at 5 out of 10. Symptoms are exacerbated by neck flexion and neck extension. Not exacerbated by turning the head to the left or the right or use of arms. Arms are relieved by rest. She's tried Flexeril, opioids, she has a past medical history of spine surgery. Evaluation has included cervical spine MRI and nerve conduction velocity studies. And spinal surgery twice.She has had C7-T1 foraminal stenosis when she was originally seen in 2006 and previous surgical interventions anteriorly C4-C7,  posteriorly C3-C5 with solid posterior and anterior fusions. No residual significant canal or foraminal stenosis at the surgical levels. There is a moderate right base disc at C2-C3 with dense the anterior thecal sac but only mild central canal stenosis and mild foraminal stenosis. At C7-T1 she has a moderate broad-based posterior disc bone spur, plus with bilateral neural foraminal narrowing more severe on the left. Clinically she has mostly left CVA type symptoms with ulnar 2 digits being numb and dysesthetic. She has some lateral sided forearm numbness and dysesthesia. No motor deficits. Her gait pattern is a little ataxic but she does not have a history of falls. She has chronic low back pain but her neck pain far exceeds her back issues. Also on the MRI there is noted be a 6 mm pituitary lesion. She was sent here for evaluation of the pituitary lesion. On the also recommended a left C8 selective nerve root block.  Reviewed MRI of the cervical spine report (I do not have images): Previous posterior decompressive surgery and posterior surgical fusion at C3-C4 and C4-C5. Anterior surgical fusion at C4-C5, C5-C6 and C6-C7. At C2-C3 degenerative changes have resulted in mild central canal stenosis with minimal deformity  of the posterior surface of the cord. At C7-T1, advanced degenerative disc disease with a posterior disc osteophyte complex. No cord deformity or central canal stenosis. Moderate to severe left neural foraminal narrowing with probable encroachment on the left C8 dorsal root ganglion. Mild diffuse myelomalacia at C3-C4, C4-C5, and C5-C6. Effusion of the left atlantooccipital joint probably degenerative in origin. Mild focal kyphosis at C7-T1. A 6 mm round lesion in the posterior aspect of the pituitary gland. Recommend MRI with pituitary protocol for further evaluation.   Reviewed MRI of the brain report from 10/17/2011 which does not make note of any pituitary abnormalities. In fact no  intracranial abnormalities noted and no areas of abnormal enhancement.  Review of Systems: Patient complains of symptoms per HPI as well as the following symptoms: headache, numbness, weakness, dizziness, joint pain, . Pertinent negatives and positives per HPI. All others negative.   Social History   Socioeconomic History  . Marital status: Married    Spouse name: Not on file  . Number of children: 2  . Years of education: 75  . Highest education level: Not on file  Occupational History  . Occupation: Retired/disabled  Tobacco Use  . Smoking status: Former Smoker    Packs/day: 1.00    Years: 36.00    Pack years: 36.00    Types: Cigarettes    Quit date: 09/2019    Years since quitting: 0.2  . Smokeless tobacco: Never Used  Substance and Sexual Activity  . Alcohol use: No  . Drug use: No  . Sexual activity: Yes  Other Topics Concern  . Not on file  Social History Narrative   Occupation: retired [employer]   Married   Alcohol no   Drug use no, no caffiene   Tobacco use- yes quit Dec '08   Regular Exercise no   4 children; reports increased stress in due to caring for grandchildren   UNEMPLOYED      Lives at home w/ her husband   Right-handed   Caffeine: 1 cup of coffee    Social Determinants of Health   Financial Resource Strain:   . Difficulty of Paying Living Expenses: Not on file  Food Insecurity:   . Worried About Charity fundraiser in the Last Year: Not on file  . Ran Out of Food in the Last Year: Not on file  Transportation Needs:   . Lack of Transportation (Medical): Not on file  . Lack of Transportation (Non-Medical): Not on file  Physical Activity:   . Days of Exercise per Week: Not on file  . Minutes of Exercise per Session: Not on file  Stress:   . Feeling of Stress : Not on file  Social Connections:   . Frequency of Communication with Friends and Family: Not on file  . Frequency of Social Gatherings with Friends and  Family: Not on file  . Attends Religious Services: Not on file  . Active Member of Clubs or Organizations: Not on file  . Attends Archivist Meetings: Not on file  . Marital Status: Not on file  Intimate Partner Violence:   . Fear of Current or Ex-Partner: Not on file  . Emotionally Abused: Not on file  . Physically Abused: Not on file  . Sexually Abused: Not on file    Family History  Problem Relation Age of Onset  . Diabetes Mother   . Multiple sclerosis Mother   . Heart disease Father   . Hypertension Sister   .  Kidney disease Sister   . Colon cancer Neg Hx     Past Medical History:  Diagnosis Date  . Chest pain   . Depression   . DJD (degenerative joint disease)   . GERD (gastroesophageal reflux disease)   . History of palpitations   . Migraine headache   . PVC's (premature ventricular contractions)   . 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 BACK SURGERY  10/23/12  . CERVICAL FUSION    . CHOLECYSTECTOMY    . LAMINECTOMY    . MRI     every 3 mths /due to menigitis  . TOTAL ABDOMINAL HYSTERECTOMY    . TUBAL LIGATION      Current Outpatient Medications  Medication Sig Dispense Refill  . ALPRAZolam (XANAX) 0.5 MG tablet Take 1 tablet (0.5 mg total) by mouth at bedtime as needed for anxiety. 30 tablet 2  . fluticasone (FLONASE) 50 MCG/ACT nasal spray Place 2 sprays into both nostrils daily. 16 g 6  . HYDROcodone-acetaminophen (NORCO) 7.5-325 MG tablet Take 1 tablet by mouth every 8 (eight) hours as needed.    Marland Kitchen levocetirizine (XYZAL) 5 MG tablet TAKE 1 TABLET(5 MG) BY MOUTH EVERY EVENING 90 tablet 1  . LINZESS 145 MCG CAPS capsule As needed.    Marland Kitchen atorvastatin (LIPITOR) 10 MG tablet Take 1 tablet (10 mg total) by mouth daily. (Patient not taking: Reported on 01/05/2020) 90 tablet 1   No current facility-administered medications for this visit.    Allergies as of 01/05/2020 - Review Complete 01/05/2020   Allergen Reaction Noted  . Anesthesia s-i-40 [propofol]  04/17/2017  . Augmentin [amoxicillin-pot clavulanate] Nausea And Vomiting 10/03/2016    Vitals: BP 139/83 (BP Location: Left Arm, Patient Position: Sitting)   Pulse 69   Temp (!) 97.5 F (36.4 C) Comment: taken at front  Ht 5\' 7"  (1.702 m)   Wt 186 lb (84.4 kg)   BMI 29.13 kg/m  Last Weight:  Wt Readings from Last 1 Encounters:  01/05/20 186 lb (84.4 kg)   Last Height:   Ht Readings from Last 1 Encounters:  01/05/20 5\' 7"  (1.702 m)   Physical exam: Exam: Gen: NAD, conversant, well nourised, obese, well groomed                     CV: RRR, no MRG. No Carotid Bruits. No peripheral edema, warm, nontender Eyes: Conjunctivae clear without exudates or hemorrhage MSK: hypertrophied cervical muscles, elevated right shoulder, decreased ROM of neck, left laterocollis and right torticollis. Decreased range of motion of the neck.  Neuro: Detailed Neurologic Exam  Speech:    Speech is normal; fluent and spontaneous with normal comprehension.  Cognition:    The patient is oriented to person, place, and time;     recent and remote memory intact;     language fluent;     normal attention, concentration,     fund of knowledge Cranial Nerves:    The pupils are equal, round, and reactive to light.  Visual fields are full to finger confrontation. Extraocular movements are intact. Trigeminal sensation is intact and the muscles of mastication are normal. The face is symmetric. The palate elevates in the midline. Hearing intact. Voice is normal. Shoulder shrug is normal. The tongue has normal motion without fasciculations.    Gait:    Normal native gait  Motor Observation:    No asymmetry, no atrophy, and no involuntary movements noted.  Tone:    Normal muscle tone.    Posture:    Posture is normal. normal erect    Strength: No focal weakness .      Sensation: intact to LT      Assessment/Plan:  59 y.o. female here as a  referral from Dr. Greta Doom for new onset paresthesias She was seen in the past for neck pain and headaches. 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 Todat here with a new symptom, radiating severe pain starting the the right occipital nerve area shooting into the head and right side of face. Sounds more like occipital neuralgia but not sure why this would make the right side of her face feel numb(trigeminal area). We will perform occipital nerve blocks today and repeat MRI brain. She has gabapentin and I encouraged her to start it (she was just given 300mg  ex a day) and slowly increase.   Occipital neuralgia of the right side. Possibly referred pain to the trigeminal nerve as well because clearly the pain starts at the emergence of the greater occipital nerve: Need MRI of the brain w/wo contrast. Nerve blocks today significantly improved with occipital nerve block 10/10 pain to 0/10 so much be occipital neuralgia with referred pain to the face but need MRI brain due to multiple nerve involvement.  Return to Dr. Greta Doom who can also perform occipital nerve blocks and titrate up her gabapentin.   Cervical Dystonia: Hypertrophied cervical muscles, elevated right shoulder, decreased ROM of neck, left laterocollis and right torticollis, chronic progressive neck pain. The neck pain is slowly progressive over years causing dysfunction and difficulty walking and headaches, pain, decreased mobility of the neck. Failed muscle relaxers, had physical therapy, tried Flexeril, hydrocodone, baclofen, amitriptyline, OTC analgesics and NSAIDs and failed them all over the last many years. She declines botox injections, she does not like needles.   Performed by Dr. Jaynee Eagles M.D. All procedures a documented blood were medically  necessary, reasonable and appropriate based on the patient's history, medical diagnosis and physician opinion. Verbal informed consent was obtained from the patient, patient was informed of potential risk of procedure, including bruising, bleeding, hematoma formation, infection, muscle weakness, muscle pain, numbness, transient hypertension, transient hyperglycemia and transient insomnia among others. All areas injected were topically clean with isopropyl rubbing alcohol. Nonsterile nonlatex gloves were worn during the procedure.  1. Greater occipital nerve block [phone]). The greater occipital nerve site was identified at the nuchal line medial to the occipital artery. Medication was injected into the left and right occipital nerve areas and suboccipital areas. Patient's condition is associated with inflammation of the greater occipital nerve and associated multiple groups. Injection was deemed medically necessary, reasonable and appropriate. Injection represents a separate and unique surgical service.  I spent 45 minutes of face-to-face and non-face-to-face time with patient on the  1. Occipital neuralgia of right side   2. Trigeminal neuralgia of right side of face   3. Right facial numbness   4. Unilateral occipital headache    diagnosis.  This included previsit chart review, lab review, study review, order entry, electronic health record documentation, patient education on the different diagnostic and therapeutic options, counseling and coordination of care, risks and benefits of management, compliance, or risk factor reduction. This does not include time spent on occipital nerve block.  Sarina Ill, MD  [facility] [address] [place], [place] [address]  Phone [phone] Fax [phone]

## 2020-01-05 ENCOUNTER — Ambulatory Visit (INDEPENDENT_AMBULATORY_CARE_PROVIDER_SITE_OTHER): Payer: Medicare Other | Admitting: Neurology

## 2020-01-05 ENCOUNTER — Encounter: Payer: Self-pay | Admitting: Neurology

## 2020-01-05 ENCOUNTER — Other Ambulatory Visit: Payer: Self-pay

## 2020-01-05 VITALS — BP 139/83 | HR 69 | Temp 97.5°F | Ht 67.0 in | Wt 186.0 lb

## 2020-01-05 DIAGNOSIS — M5481 Occipital neuralgia: Secondary | ICD-10-CM | POA: Insufficient documentation

## 2020-01-05 DIAGNOSIS — G500 Trigeminal neuralgia: Secondary | ICD-10-CM

## 2020-01-05 DIAGNOSIS — R519 Headache, unspecified: Secondary | ICD-10-CM

## 2020-01-05 DIAGNOSIS — R200 Anesthesia of skin: Secondary | ICD-10-CM

## 2020-01-05 NOTE — Patient Instructions (Signed)
Start gabapentin  Occipital Nerve Block Patient Information  Description: The occipital nerves originate in the cervical (neck) spinal cord and travel upward through muscle and tissue to supply sensation to the back of the head and top of the scalp.  In addition, the nerves control some of the muscles of the scalp.  Occipital neuralgia is an irritation of these nerves which can cause headaches, numbness of the scalp, and neck discomfort.     The occipital nerve block will interrupt nerve transmission through these nerves and can relieve pain and spasm.  The block consists of insertion of a small needle under the skin in the back of the head to deposit local anesthetic (numbing medicine) and/or steroids around the nerve.  The entire block usually lasts less than 5 minutes.  Conditions which may be treated by occipital blocks:   Muscular pain and spasm of the scalp  Nerve irritation, back of the head  Headaches  Upper neck pain  Preparation for the injection:  1. Do not eat any solid food or dairy products within 8 hours of your appointment. 2. You may drink clear liquids up to 3 hours before appointment.  Clear liquids include water, black coffee, juice or soda.  No milk or cream please. 3. You may take your regular medication, including pain medications, with a sip of water before you appointment.  Diabetics should hold regular insulin (if taken separately) and take 1/2 normal NPH dose the morning of the procedure.  Carry some sugar containing items with you to your appointment. 4. A driver must accompany you and be prepared to drive you home after your procedure. 5. Bring all your current medications with you. 6. An IV may be inserted and sedation may be given at the discretion of the physician. 7. A blood pressure cuff, EKG, and other monitors will often be applied during the procedure.  Some patients may need to have extra oxygen administered for a short period. 8. You will be asked to  provide medical information, including your allergies and medications, prior to the procedure.  We must know immediately if you are taking blood thinners (like Coumadin/Warfarin) or if you are allergic to IV iodine contrast (dye).  We must know if you could possible be pregnant.  9. Do not wear a high collared shirt or turtleneck.  Tie long hair up in the back if possible.  Possible side-effects:   Bleeding from needle site  Infection (rare, may require surgery)  Nerve injury (rare)  Hair on back of neck can be tinged with iodine scrub (this will wash out)  Light-headedness (temporary)  Pain at injection site (several days)  Decreased blood pressure (rare, temporary)  Seizure (very rare)  Call if you experience:   Hives or difficulty breathing ( go to the emergency room)  Inflammation or drainage at the injection site(s)  Please note:  Although the local anesthetic injected can often make your painful muscles or headache feel good for several hours after the injection, the pain may return.  It takes 3-7 days for steroids to work.  You may not notice any pain relief for at least one week.  If effective, we will often do a series of injections spaced 3-6 weeks apart to maximally decrease your pain.  If you have any questions, please call [phone] [facility]   Occipital Neuralgia  Occipital neuralgia is a type of headache that causes brief episodes of very bad pain in the back of  your head. Pain from occipital neuralgia may spread (radiate) to other parts of your head. These headaches may be caused by irritation of the nerves that leave your spinal cord high up in your neck, just below the base of your skull (occipital nerves). Your occipital nerves transmit sensations from the back of your head, the top of your head, and the areas behind your ears. What are the causes? This condition can occur without any known cause (primary headache  syndrome). In other cases, this condition is caused by pressure on or irritation of one of the two occipital nerves. Pressure and irritation may be due to:  Muscle spasm in the neck.  Neck injury.  Wear and tear of the vertebrae in the neck (osteoarthritis).  Disease of the disks that separate the vertebrae.  Swollen blood vessels that put pressure on the occipital nerves.  Infections.  Tumors.  Diabetes. What are the signs or symptoms? This condition causes brief burning, stabbing, electric, shocking, or shooting pain which can radiate to the top of the head. It can happen on one side or both sides of the head. It can also cause:  Pain behind the eye.  Pain triggered by neck movement or hair brushing.  Scalp tenderness.  Aching in the back of the head between episodes of very bad pain.  Pain gets worse with exposure to bright lights. How is this diagnosed? There is no test that diagnoses this condition. Your health care provider may diagnose this condition based on a physical exam and your symptoms. Other tests may be done, such as:  Imaging studies of the brain and neck (cervical spine), such as an MRI or CT scan. These look for causes of pinched nerves.  Applying pressure to the nerves in the neck to try to re-create the pain.  Injection of numbing medicine into the occipital nerve areas to see if pain goes away (diagnostic nerve block). How is this treated? Treatment for this condition may begin with simple measures, such as:  Rest.  Massage.  Applying heat or cold on the area.  Over-the-counter pain relievers. If these measures do not work, you may need other treatments, including:  Medicines, such as: ? Prescription-strength anti-inflammatory medicines. ? Muscle relaxants. ? Anti-seizure medicines, which can relieve pain. ? Antidepressants, which can relieve pain. ? Injected medicines, such as medicines that numb the area (local anesthetic) and  steroids.  Pulsed radiofrequency ablation. This is when wires are implanted to deliver electrical impulses that block pain signals from the occipital nerve.  Surgery to relieve nerve pressure.  Physical therapy. Follow these instructions at home: Pain management      Avoid any activities that cause pain.  Rest when you have an attack of pain.  Try gentle massage to relieve pain.  Try a different pillow or sleeping position.  If directed, apply heat to the affected area as told by your health care provider. Use the heat source that your health care provider recommends, such as a moist heat pack or a heating pad. ? Place a towel between your skin and the heat source. ? Leave the heat on for 20-30 minutes. ? Remove the heat if your skin turns bright red. This is especially important if you are unable to feel pain, heat, or cold. You may have a greater risk of getting burned.  If directed, apply ice to the back of the head and neck area as told by your health care provider. ? Put ice in a plastic bag. ?  Place a towel between your skin and the bag. ? Leave the ice on for 20 minutes, 2-3 times per day. General instructions  Take over-the-counter and prescription medicines only as told by your health care provider.  Avoid things that make your symptoms worse, such as bright lights.  Try to stay active. Get regular exercise that does not cause pain. Ask your health care provider to suggest safe exercises for you.  Work with a physical therapist to learn stretching exercises you can do at home.  Practice good posture.  Keep all follow-up visits as told by your health care provider. This is important. Contact a health care provider if:  Your medicine is not working.  You have new or worsening symptoms. Get help right away if:  You have very bad head pain that does not go away.  You have a sudden change in vision, balance, or speech. Summary  Occipital neuralgia is a type  of headache that causes brief episodes of very bad pain in the back of your head.  Pain from occipital neuralgia may spread (radiate) to other parts of your head.  Treatment for this condition includes rest, massage, and medicines. This information is not intended to replace advice given to you by your health care provider. Make sure you discuss any questions you have with your health care provider. Document Revised: 10/01/2017 Document Reviewed: 12/20/2016 Elsevier Patient Education  2020 [place].   Trigeminal Neuralgia  Trigeminal neuralgia is a nerve disorder that causes severe pain on one side of the face. The pain may last from a few seconds to several minutes. The pain is usually only on one side of the face. Symptoms may occur for days, weeks, or months and then go away for months or years. The pain may return and be worse than before. What are the causes? This condition is caused by damage or pressure to a nerve in the head that is called the trigeminal nerve. An attack can be triggered by:  Talking.  Chewing.  Putting on makeup.  Washing your face.  Shaving your face.  Brushing your teeth.  Touching your face. What increases the risk? You are more likely to develop this condition if you:  Are 37 years of age or older.  Are female. What are the signs or symptoms? The main symptom of this condition is severe pain in the:  Jaw.  Lips.  Eyes.  Nose.  Scalp.  Forehead.  Face. The pain may be:  Intense.  Stabbing.  Electric.  Shock-like. How is this diagnosed? This condition is diagnosed with a physical exam. A CT scan or an MRI may be done to rule out other conditions that can cause facial pain. How is this treated? This condition may be treated with:  Avoiding the things that trigger your symptoms.  Taking prescription medicines (anticonvulsants).  Having surgery. This may be done in severe cases if other medical treatment does not  provide relief.  Having procedures such as ablation, thermal, or radiation therapy. It may take up to one month for treatment to start relieving the pain. Follow these instructions at home: Managing pain  Learn as much as you can about how to manage your pain. Ask your health care provider if a pain specialist would be helpful.  Consider talking with a mental health care provider (psychologist) about how to cope with the pain.  Consider joining a pain support group. General instructions  Take over-the-counter and prescription medicines only as told by your  health care provider.  Avoid the things that trigger your symptoms. It may help to: ? Chew on the unaffected side of your mouth. ? Avoid touching your face. ? Avoid blasts of hot or cold air.  Follow your treatment plan as told by your health care provider. This may include: ? Cognitive or behavioral therapy. ? Gentle, regular exercise. ? Meditation or yoga. ? Aromatherapy.  Keep all follow-up visits as told by your health care provider. You may need to be monitored closely to make sure treatment is working well for you. Where to find more information  Facial Pain Association: fpa-support.org Contact a health care provider if:  Your medicine is not helping your symptoms.  You have side effects from the medicine used for treatment.  You develop new, unexplained symptoms, such as: ? Double vision. ? Facial weakness. ? Facial numbness. ? Changes in hearing or balance.  You feel depressed. Get help right away if:  Your pain is severe and is not getting better.  You develop suicidal thoughts. If you ever feel like you may hurt yourself or others, or have thoughts about taking your own life, get help right away. You can go to your nearest emergency department or call:  Your local emergency services (911 in the [place]  A suicide crisis helpline, such as the [place] at 781-601-2402. This  is open 24 hours a day. Summary  Trigeminal neuralgia is a nerve disorder that causes severe pain on one side of the face. The pain may last from a few seconds to several minutes.  This condition is caused by damage or pressure to a nerve in the head that is called the trigeminal nerve.  Treatment may include avoiding the things that trigger your symptoms, taking medicines, or having surgery or procedures. It may take up to one month for treatment to start relieving the pain.  Avoid the things that trigger your symptoms.  Keep all follow-up visits as told by your health care provider. You may need to be monitored closely to make sure treatment is working well for you. This information is not intended to replace advice given to you by your health care provider. Make sure you discuss any questions you have with your health care provider. Document Revised: 09/01/2018 Document Reviewed: 09/01/2018 Elsevier Patient Education  [place].

## 2020-01-05 NOTE — Progress Notes (Signed)
Nerve block w/o steroid: Prepared by physician Pt signed consent  0.5% Bupivocaine 1.5 mL LOT: [id number] EXP: 11/21 NDC: [id number]  2% Lidocaine 1.5 mL LOT: [id number] EXP: 03/29/2020 NDC: [id number]

## 2020-01-06 ENCOUNTER — Telehealth: Payer: Self-pay | Admitting: Neurology

## 2020-01-06 NOTE — Telephone Encounter (Signed)
[employer] medicare order sent to [facility]. No auth they will reach out to the patient to schedule.  

## 2020-02-04 ENCOUNTER — Other Ambulatory Visit: Payer: Medicare Other

## 2020-02-09 ENCOUNTER — Other Ambulatory Visit: Payer: Medicare Other

## 2020-03-01 ENCOUNTER — Ambulatory Visit
Admission: RE | Admit: 2020-03-01 | Discharge: 2020-03-01 | Disposition: A | Discharge status: Home / Self Care | Payer: Medicare Other | Source: Ambulatory Visit | Attending: Neurology | Admitting: Neurology

## 2020-03-01 ENCOUNTER — Other Ambulatory Visit: Payer: Self-pay

## 2020-03-01 ENCOUNTER — Encounter: Payer: Self-pay | Admitting: Internal Medicine

## 2020-03-01 ENCOUNTER — Ambulatory Visit (INDEPENDENT_AMBULATORY_CARE_PROVIDER_SITE_OTHER): Payer: Medicare Other | Admitting: Internal Medicine

## 2020-03-01 VITALS — BP 152/106 | HR 61 | Temp 98.1°F | Ht 67.0 in | Wt 188.0 lb

## 2020-03-01 DIAGNOSIS — I10 Essential (primary) hypertension: Secondary | ICD-10-CM | POA: Diagnosis not present

## 2020-03-01 DIAGNOSIS — R200 Anesthesia of skin: Secondary | ICD-10-CM

## 2020-03-01 DIAGNOSIS — R519 Headache, unspecified: Secondary | ICD-10-CM

## 2020-03-01 DIAGNOSIS — G500 Trigeminal neuralgia: Secondary | ICD-10-CM

## 2020-03-01 LAB — URINALYSIS, ROUTINE W REFLEX MICROSCOPIC
Bilirubin Urine: NEGATIVE
Hgb urine dipstick: NEGATIVE
Ketones, ur: NEGATIVE
Leukocytes,Ua: NEGATIVE
Nitrite: NEGATIVE
Specific Gravity, Urine: 1.01 (ref 1.000–1.030)
Total Protein, Urine: NEGATIVE
Urine Glucose: NEGATIVE
Urobilinogen, UA: 0.2 (ref 0.0–1.0)
pH: 6 (ref 5.0–8.0)

## 2020-03-01 LAB — BASIC METABOLIC PANEL
BUN: 10 mg/dL (ref 6–23)
CO2: 28 mEq/L (ref 19–32)
Calcium: 9.8 mg/dL (ref 8.4–10.5)
Chloride: 103 mEq/L (ref 96–112)
Creatinine, Ser: 0.7 mg/dL (ref 0.40–1.20)
GFR: 103.67 mL/min (ref >60.00)
Glucose, Bld: 85 mg/dL (ref 70–99)
Potassium: 4.2 mEq/L (ref 3.5–5.1)
Sodium: 137 mEq/L (ref 135–145)

## 2020-03-01 LAB — CBC WITH DIFFERENTIAL/PLATELET
Basophils Absolute: 0.1 10*3/uL (ref 0.0–0.1)
Basophils Relative: 0.8 % (ref 0.0–3.0)
Eosinophils Absolute: 0.2 10*3/uL (ref 0.0–0.7)
Eosinophils Relative: 2.1 % (ref 0.0–5.0)
HCT: 42.1 % (ref 36.0–46.0)
Hemoglobin: 13.6 g/dL (ref 12.0–15.0)
Lymphocytes Relative: 53.2 % — ABNORMAL HIGH (ref 12.0–46.0)
Lymphs Abs: 3.8 10*3/uL (ref 0.7–4.0)
MCHC: 32.4 g/dL (ref 30.0–36.0)
MCV: 89.7 fl (ref 78.0–100.0)
Monocytes Absolute: 0.4 10*3/uL (ref 0.1–1.0)
Monocytes Relative: 5.8 % (ref 3.0–12.0)
Neutro Abs: 2.8 10*3/uL (ref 1.4–7.7)
Neutrophils Relative %: 38.1 % — ABNORMAL LOW (ref 43.0–77.0)
Platelets: 362 10*3/uL (ref 150.0–400.0)
RBC: 4.69 Mil/uL (ref 3.87–5.11)
RDW: 13.1 % (ref 11.5–15.5)
WBC: 7.2 10*3/uL (ref 4.0–10.5)

## 2020-03-01 LAB — TSH: TSH: 3.95 u[IU]/mL (ref 0.35–4.50)

## 2020-03-01 MED ORDER — INDAPAMIDE 1.25 MG PO TABS: 1.2500 mg | ORAL_TABLET | Freq: Every day | ORAL | 0 refills | Status: DC

## 2020-03-01 MED ORDER — GADOBENATE DIMEGLUMINE 529 MG/ML IV SOLN: 17.0000 mL | Freq: Once | INTRAVENOUS | Status: DC | PRN

## 2020-03-01 NOTE — Progress Notes (Signed)
Subjective:  Patient ID: [name], female    DOB: Feb 18, 1961  Age: 59 y.o. MRN: [id number]  CC: Hypertension  This visit occurred during the SARS-CoV-2 public health emergency.  Safety protocols were in place, including screening questions prior to the visit, additional usage of staff PPE, and extensive cleaning of exam room while observing appropriate contact time as indicated for disinfecting solutions.   HPI [name] presents for concerns about elevated BP.  She tells me her blood pressure has been high over the last few weeks.  She has had a headache and neck pain and tells me her neurologist has her scheduled for an MRI of her brain and cervical spine later today.  She describes the headache as being in the temples and the forehead area.  She has nausea but no vomiting.  She is not taking decongestants, she has quit smoking, and she is not taking anti-inflammatories.  She complains of weight gain.  Outpatient Medications Prior to Visit  Medication Sig Dispense Refill  . ALPRAZolam (XANAX) 0.5 MG tablet Take 1 tablet (0.5 mg total) by mouth at bedtime as needed for anxiety. 30 tablet 2  . fluticasone (FLONASE) 50 MCG/ACT nasal spray Place 2 sprays into both nostrils daily. 16 g 6  . HYDROcodone-acetaminophen (NORCO) 7.5-325 MG tablet Take 1 tablet by mouth every 8 (eight) hours as needed.    Marland Kitchen levocetirizine (XYZAL) 5 MG tablet TAKE 1 TABLET(5 MG) BY MOUTH EVERY EVENING 90 tablet 1  . LINZESS 145 MCG CAPS capsule As needed.     No facility-administered medications prior to visit.    ROS Review of Systems  Constitutional: Positive for unexpected weight change. Negative for appetite change, diaphoresis and fatigue.  HENT: Negative.   Eyes: Negative.   Respiratory: Negative for cough, chest tightness, shortness of breath and wheezing.   Cardiovascular: Negative for chest pain, palpitations and leg swelling.  Gastrointestinal: Positive for nausea. Negative for abdominal  pain and vomiting.  Endocrine: Negative.   Genitourinary: Negative.  Negative for difficulty urinating, dysuria, hematuria and urgency.  Musculoskeletal: Positive for neck pain. Negative for back pain and myalgias.  Skin: Negative.  Negative for color change and rash.  Neurological: Positive for headaches. Negative for dizziness, weakness, light-headedness and numbness.  Hematological: Negative for adenopathy. Does not bruise/bleed easily.  Psychiatric/Behavioral: Negative.     Objective:  BP (!) 152/106 (BP Location: Left Arm, Patient Position: Sitting, Cuff Size: Normal)   Pulse 61   Temp 98.1 F (36.7 C) (Oral)   Ht 5\' 7"  (1.702 m)   Wt 188 lb (85.3 kg)   SpO2 99%   BMI 29.44 kg/m   BP Readings from Last 3 Encounters:  03/01/20 (!) 152/106  01/05/20 139/83  06/22/19 124/84    Wt Readings from Last 3 Encounters:  03/01/20 188 lb (85.3 kg)  01/05/20 186 lb (84.4 kg)  06/22/19 155 lb (70.3 kg)    Physical Exam Vitals reviewed.  Constitutional:      Appearance: Normal appearance.  HENT:     Nose: Nose normal.     Mouth/Throat:     Mouth: Mucous membranes are moist.  Eyes:     General: No scleral icterus.    Conjunctiva/sclera: Conjunctivae normal.  Cardiovascular:     Rate and Rhythm: Regular rhythm. Bradycardia present.     Heart sounds: No murmur.     Comments: EKG - Sinus bradycardia, 59 bpm No LVH Otherwise normal EKG Pulmonary:     Effort: Pulmonary  effort is normal.     Breath sounds: No stridor. No wheezing, rhonchi or rales.  Abdominal:     General: Abdomen is flat. Bowel sounds are normal. There is no distension.     Palpations: Abdomen is soft. There is no hepatomegaly, splenomegaly or mass.  Musculoskeletal:     Cervical back: Neck supple.     Right lower leg: No edema.     Left lower leg: No edema.  Lymphadenopathy:     Cervical: No cervical adenopathy.  Skin:    General: Skin is warm and dry.  Neurological:     General: No focal deficit  present.     Mental Status: She is alert.  Psychiatric:        Mood and Affect: Mood normal.        Behavior: Behavior normal.     Lab Results  Component Value Date   WBC 7.2 03/01/2020   HGB 13.6 03/01/2020   HCT 42.1 03/01/2020   PLT 362.0 03/01/2020   GLUCOSE 85 03/01/2020   CHOL 175 03/31/2019   TRIG 82.0 03/31/2019   HDL 63.30 03/31/2019   LDLCALC 95 03/31/2019   ALT 16 03/31/2019   AST 15 03/31/2019   NA 137 03/01/2020   K 4.2 03/01/2020   CL 103 03/01/2020   CREATININE 0.70 03/01/2020   BUN 10 03/01/2020   CO2 28 03/01/2020   TSH 3.95 03/01/2020   HGBA1C 5.4 01/16/2018    CT CHEST LUNG CA SCREEN LOW DOSE W/O CM  Result Date: 06/04/2019 CLINICAL DATA:  59 year old asymptomatic female current smoker with 37 pack-year smoking history. EXAM: CT CHEST WITHOUT CONTRAST LOW-DOSE FOR LUNG CANCER SCREENING TECHNIQUE: Multidetector CT imaging of the chest was performed following the standard protocol without IV contrast. COMPARISON:  03/14/2018 screening chest CT. FINDINGS: Cardiovascular: Normal heart size. No significant pericardial effusion/thickening. Mildly atherosclerotic nonaneurysmal thoracic aorta. Normal caliber pulmonary arteries. Mediastinum/Nodes: No discrete thyroid nodules. Unremarkable esophagus. No pathologically enlarged axillary, mediastinal or hilar lymph nodes, noting limited sensitivity for the detection of hilar adenopathy on this noncontrast study. Lungs/Pleura: No pneumothorax. No pleural effusion. Mild centrilobular emphysema. No acute consolidative airspace disease or lung masses. No significant growth of previously visualized tiny pulmonary nodule. No new significant pulmonary nodules. Upper abdomen: Cholecystectomy. Musculoskeletal: No aggressive appearing focal osseous lesions. Surgical hardware from ACD[facility] is partially visualized in the lower cervical spine. IMPRESSION: Lung-RADS 2, benign appearance or behavior. Continue annual screening with low-dose chest  CT without contrast in 12 months. Aortic Atherosclerosis (ICD10-I70.0) and Emphysema (ICD10-J43.9). Electronically Signed   By: Ilona Sorrel M.D.   On: 06/04/2019 08:59     Assessment & Plan:   [name] was seen today for hypertension.  Diagnoses and all orders for this visit:  Essential hypertension- She has developed stage II hypertension and she is symptomatic.  Labs to screen for secondary causes are negative.  I await the results of her MRI.  I have asked her to start taking a thiazide diuretic to try to achieve her blood pressure goal of 130/80. -     Basic metabolic panel; Future -     CBC with Differential/Platelet; Future -     TSH; Future -     Urinalysis, Routine w reflex microscopic; Future -     indapamide (LOZOL) 1.25 MG tablet; Take 1 tablet (1.25 mg total) by mouth daily. -     EKG 12-Lead -     Urinalysis, Routine w reflex microscopic -  TSH -     CBC with Differential/Platelet -     Basic metabolic panel   I am having [name] start on indapamide. I am also having her maintain her fluticasone, Linzess, ALPRAZolam, levocetirizine, and HYDROcodone-acetaminophen.  Meds ordered this encounter  Medications  . indapamide (LOZOL) 1.25 MG tablet    Sig: Take 1 tablet (1.25 mg total) by mouth daily.    Dispense:  90 tablet    Refill:  0     Follow-up: Return in about 6 weeks (around 04/12/2020).  Scarlette Calico, MD

## 2020-03-01 NOTE — Patient Instructions (Signed)

## 2020-03-03 ENCOUNTER — Other Ambulatory Visit: Payer: Self-pay | Admitting: Neurology

## 2020-03-03 ENCOUNTER — Telehealth: Payer: Self-pay | Admitting: Neurology

## 2020-03-03 MED ORDER — ALPRAZOLAM 0.25 MG PO TABS: ORAL_TABLET | ORAL | 0 refills | Status: DC

## 2020-03-03 NOTE — Telephone Encounter (Signed)
Sent in 4 xanax for the MRI

## 2020-03-03 NOTE — Telephone Encounter (Signed)
Per [place] registry, pt is on Hydrocodone which is noted on MAR. She filled a 30 day supply of Alprazolam 0.5 mg on 11/27/19.

## 2020-03-03 NOTE — Telephone Encounter (Signed)
Patient called to inform she had to reschedule her MRI apt as she was unable to do it and would like to know if she could be called in something to help calm her  Pt next apt: may 23rd @9 :40am

## 2020-03-03 NOTE — Telephone Encounter (Signed)
Spoke with pt and discussed that Xanax was sent to her pharmacy. She said she takes Xanax as needed. She understands the directions for this small prescription Dr. Jaynee Eagles provided.

## 2020-03-04 ENCOUNTER — Telehealth (INDEPENDENT_AMBULATORY_CARE_PROVIDER_SITE_OTHER): Payer: Medicare Other | Admitting: Family

## 2020-03-04 DIAGNOSIS — R030 Elevated blood-pressure reading, without diagnosis of hypertension: Secondary | ICD-10-CM | POA: Diagnosis not present

## 2020-03-04 DIAGNOSIS — J019 Acute sinusitis, unspecified: Secondary | ICD-10-CM | POA: Diagnosis not present

## 2020-03-04 MED ORDER — AZITHROMYCIN 250 MG PO TABS: ORAL_TABLET | ORAL | 0 refills | Status: DC

## 2020-03-04 MED ORDER — METHYLPREDNISOLONE 4 MG PO TBPK: ORAL_TABLET | ORAL | 0 refills | Status: DC

## 2020-03-04 NOTE — Progress Notes (Signed)
[name] is a 59 y.o. female with the following history as recorded in Epic[employer]:  Patient Active Problem List   Diagnosis Date Noted  . Essential hypertension 03/01/2020  . Occipital neuralgia of right side 01/05/2020  . Atherosclerosis of aorta ([place]) 03/31/2019  . Dyslipidemia, goal LDL below 100 03/31/2019  . Visit for screening mammogram 03/31/2019  . Allergic rhinitis 02/22/2017  . Overweight (BMI 25.0-29.9) 03/22/2016  . Preventative health care 09/09/2014  . Tobacco abuse 11/25/2012  . GAD (generalized anxiety disorder) 04/13/2010  . PREMATURE VENTRICULAR CONTRACTIONS 02/06/2010  . ELECTROCARDIOGRAM, ABNORMAL 02/06/2010  . MIGRAINE HEADACHE 07/24/2007  . GASTROESOPHAGEAL REFLUX DISEASE 07/24/2007    Current Outpatient Medications  Medication Sig Dispense Refill  . ALPRAZolam (XANAX) 0.25 MG tablet Take 1-2 tabs (0.25mg -0.50mg ) 30-60 minutes before procedure. May repeat if needed.Do not drive. 4 tablet 0  . ALPRAZolam (XANAX) 0.5 MG tablet Take 1 tablet (0.5 mg total) by mouth at bedtime as needed for anxiety. 30 tablet 2  . azithromycin (ZITHROMAX) 250 MG tablet 2 tabs po qd x 1 day; 1 tablet per day x 4 days; 6 tablet 0  . fluticasone (FLONASE) 50 MCG/ACT nasal spray Place 2 sprays into both nostrils daily. 16 g 6  . HYDROcodone-acetaminophen (NORCO) 7.5-325 MG tablet Take 1 tablet by mouth every 8 (eight) hours as needed.    . indapamide (LOZOL) 1.25 MG tablet Take 1 tablet (1.25 mg total) by mouth daily. 90 tablet 0  . levocetirizine (XYZAL) 5 MG tablet TAKE 1 TABLET(5 MG) BY MOUTH EVERY EVENING 90 tablet 1  . LINZESS 145 MCG CAPS capsule As needed.    . methylPREDNISolone (MEDROL DOSEPAK) 4 MG TBPK tablet Taper as directed 21 tablet 0   No current facility-administered medications for this visit.    Allergies: Anesthesia s-i-40 [propofol], Augmentin [amoxicillin-pot clavulanate], and Doxycycline  Past Medical History:  Diagnosis Date  . Chest pain   .  Depression   . DJD (degenerative joint disease)   . GERD (gastroesophageal reflux disease)   . History of palpitations   . Migraine headache   . PVC's (premature ventricular contractions)   . 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 BACK SURGERY  10/23/12  . CERVICAL FUSION    . CHOLECYSTECTOMY    . LAMINECTOMY    . MRI     every 3 mths /due to menigitis  . TOTAL ABDOMINAL HYSTERECTOMY    . TUBAL LIGATION      Family History  Problem Relation Age of Onset  . Diabetes Mother   . Multiple sclerosis Mother   . Heart disease Father   . Hypertension Sister   . Kidney disease Sister   . Colon cancer Neg Hx     Social History   Tobacco Use  . Smoking status: Former Smoker    Packs/day: 1.00    Years: 36.00    Pack years: 36.00    Types: Cigarettes    Quit date: 09/2019    Years since quitting: 0.4  . Smokeless tobacco: Never Used  Substance Use Topics  . Alcohol use: No    Subjective:   I connected with [name] on 03/04/20 at 10:00 AM EDT by a video enabled telemedicine application and verified that I am speaking with the correct person using two identifiers.   I discussed the limitations of evaluation and management by telemedicine and the availability of in person appointments. The patient expressed  understanding and agreed to proceed. Provider in office/ patient is at home; provider and patient are only 2 people on video call.   She did see her PCP earlier this week with concerns for blood pressure elevation. She was prescribed Indapamide and notes she only took one tablet on Tuesday pm. Has not taken anymore since that time and blood pressure has normalized; she had to re-schedule her MRIs that were scheduled earlier this week because she felt so bad; Now having sinus pain/ pressure- notes that left ear feels very full/ "lots of drainage." Feels mildly dizzy/ off balance; Notes that she does well with Z-pak-  asks about this prescription.     Objective:  There were no vitals filed for this visit.  General: Well developed, well nourished, in no acute distress  Skin : Warm and dry.  Head: Normocephalic and atraumatic  Lungs: Respirations unlabored;  Neurologic: Alert and oriented; speech intact; face symmetrical; moves all extremities well; CNII-XII intact without focal deficit   Assessment:  1. Acute sinusitis, recurrence not specified, unspecified location   2. Elevated blood pressure reading     Plan:  1. Rx for Z-pak, Medrol Dose pak; has done well on this combination in the past; increase fluids, rest and follow-up worse, no better. 2. Per patient, her blood pressure has now normalized and she is not currently taking the Indapamide; asked her to continue to monitor her blood pressure regularly and keep planned appointment for MRIs later this month; she will also need to see her PCP as scheduled and they can can decide if she needs the medication.   No follow-ups on file.  No orders of the defined types were placed in this encounter.   Requested Prescriptions   Signed Prescriptions Disp Refills  . azithromycin (ZITHROMAX) 250 MG tablet 6 tablet 0    Sig: 2 tabs po qd x 1 day; 1 tablet per day x 4 days;  . methylPREDNISolone (MEDROL DOSEPAK) 4 MG TBPK tablet 21 tablet 0    Sig: Taper as directed

## 2020-03-20 ENCOUNTER — Ambulatory Visit
Admission: RE | Admit: 2020-03-20 | Discharge: 2020-03-20 | Disposition: A | Discharge status: Home / Self Care | Payer: Medicare Other | Source: Ambulatory Visit | Attending: Neurology | Admitting: Neurology

## 2020-03-20 ENCOUNTER — Other Ambulatory Visit: Payer: Self-pay | Admitting: Internal Medicine

## 2020-03-20 DIAGNOSIS — R200 Anesthesia of skin: Secondary | ICD-10-CM | POA: Diagnosis not present

## 2020-03-20 DIAGNOSIS — G500 Trigeminal neuralgia: Secondary | ICD-10-CM | POA: Diagnosis not present

## 2020-03-20 DIAGNOSIS — F411 Generalized anxiety disorder: Secondary | ICD-10-CM

## 2020-03-20 DIAGNOSIS — R519 Headache, unspecified: Secondary | ICD-10-CM | POA: Diagnosis not present

## 2020-03-20 MED ORDER — GADOBENATE DIMEGLUMINE 529 MG/ML IV SOLN
17.0000 mL | Freq: Once | INTRAVENOUS | Status: AC | PRN
  Administered 2020-03-20: 17 mL via INTRAVENOUS

## 2020-05-25 ENCOUNTER — Encounter: Payer: Self-pay | Admitting: Family

## 2020-05-25 ENCOUNTER — Telehealth (INDEPENDENT_AMBULATORY_CARE_PROVIDER_SITE_OTHER): Payer: Medicare Other | Admitting: Family

## 2020-05-25 DIAGNOSIS — J019 Acute sinusitis, unspecified: Secondary | ICD-10-CM

## 2020-05-25 MED ORDER — AZITHROMYCIN 250 MG PO TABS: ORAL_TABLET | ORAL | 0 refills | Status: DC

## 2020-05-25 MED ORDER — METHYLPREDNISOLONE 4 MG PO TBPK: ORAL_TABLET | ORAL | 0 refills | Status: DC

## 2020-05-25 NOTE — Progress Notes (Signed)
[name] is a 59 y.o. female with the following history as recorded in EpicCare:  Patient Active Problem List   Diagnosis Date Noted  . Essential hypertension 03/01/2020  . Occipital neuralgia of right side 01/05/2020  . Atherosclerosis of aorta ([place]) 03/31/2019  . Dyslipidemia, goal LDL below 100 03/31/2019  . Visit for screening mammogram 03/31/2019  . Allergic rhinitis 02/22/2017  . Overweight (BMI 25.0-29.9) 03/22/2016  . Preventative health care 09/09/2014  . Tobacco abuse 11/25/2012  . GAD (generalized anxiety disorder) 04/13/2010  . PREMATURE VENTRICULAR CONTRACTIONS 02/06/2010  . ELECTROCARDIOGRAM, ABNORMAL 02/06/2010  . MIGRAINE HEADACHE 07/24/2007  . GASTROESOPHAGEAL REFLUX DISEASE 07/24/2007    Current Outpatient Medications  Medication Sig Dispense Refill  . ALPRAZolam (XANAX) 0.25 MG tablet Take 1-2 tabs (0.25mg -0.50mg ) 30-60 minutes before procedure. May repeat if needed.Do not drive. 4 tablet 0  . ALPRAZolam (XANAX) 0.5 MG tablet TAKE 1 TABLET BY MOUTH AT BEDTIME AS NEEDED FOR ANXIETY 30 tablet 2  . azithromycin (ZITHROMAX) 250 MG tablet 2 tabs po qd x 1 day; 1 tablet per day x 4 days; 6 tablet 0  . fluticasone (FLONASE) 50 MCG/ACT nasal spray Place 2 sprays into both nostrils daily. 16 g 6  . HYDROcodone-acetaminophen (NORCO) 7.5-325 MG tablet Take 1 tablet by mouth every 8 (eight) hours as needed.    . indapamide (LOZOL) 1.25 MG tablet Take 1 tablet (1.25 mg total) by mouth daily. (Patient not taking: Reported on 03/04/2020) 90 tablet 0  . levocetirizine (XYZAL) 5 MG tablet TAKE 1 TABLET(5 MG) BY MOUTH EVERY EVENING 90 tablet 1  . LINZESS 145 MCG CAPS capsule As needed.    . methylPREDNISolone (MEDROL DOSEPAK) 4 MG TBPK tablet Taper as directed 21 tablet 0   No current facility-administered medications for this visit.    Allergies: Anesthesia s-i-40 [propofol], Augmentin [amoxicillin-pot clavulanate], and Doxycycline  Past Medical History:  Diagnosis Date   . Chest pain   . Depression   . DJD (degenerative joint disease)   . GERD (gastroesophageal reflux disease)   . History of palpitations   . Migraine headache   . PVC's (premature ventricular contractions)   . 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 BACK SURGERY  10/23/12  . CERVICAL FUSION    . CHOLECYSTECTOMY    . LAMINECTOMY    . MRI     every 3 mths /due to menigitis  . TOTAL ABDOMINAL HYSTERECTOMY    . TUBAL LIGATION      Family History  Problem Relation Age of Onset  . Diabetes Mother   . Multiple sclerosis Mother   . Heart disease Father   . Hypertension Sister   . Kidney disease Sister   . Colon cancer Neg Hx     Social History   Tobacco Use  . Smoking status: Former Smoker    Packs/day: 1.00    Years: 36.00    Pack years: 36.00    Types: Cigarettes    Quit date: 09/2019    Years since quitting: 0.6  . Smokeless tobacco: Never Used  Substance Use Topics  . Alcohol use: No    Subjective:   I connected with [name] on 05/25/20 at 10:00 AM EDT by a video enabled telemedicine application and verified that I am speaking with the correct person using two identifiers.   I discussed the limitations of evaluation and management by telemedicine and the availability of in person appointments.  The patient expressed understanding and agreed to proceed. Provider in office/ patient is at home; provider and patient are only 2 people on video call.   Concern for possible sinus infection; no smell or taste x 5 days; is fully vaccinated against COVID; decreased appetite; + sinus pressure/ pain; prone to recurrent sinus infections; did feel that infection from May cleared completely and would like to use same medications again.    Objective:  There were no vitals filed for this visit.  General: Well developed, well nourished, in no acute distress  Skin : Warm and dry.  Head: Normocephalic and atraumatic  Lungs:  Respirations unlabored;  Neurologic: Alert and oriented; speech intact; face symmetrical;  Assessment:  1. Acute sinusitis, recurrence not specified, unspecified location     Plan:  Will treat with combination of Z-pak and Prednisone; due to loss of taste and smell, she is encouraged to get a COVID test today and she agrees; follow-up to be determined.  No follow-ups on file.  No orders of the defined types were placed in this encounter.   Requested Prescriptions   Signed Prescriptions Disp Refills  . azithromycin (ZITHROMAX) 250 MG tablet 6 tablet 0    Sig: 2 tabs po qd x 1 day; 1 tablet per day x 4 days;  . methylPREDNISolone (MEDROL DOSEPAK) 4 MG TBPK tablet 21 tablet 0    Sig: Taper as directed

## 2020-06-05 ENCOUNTER — Other Ambulatory Visit: Payer: Self-pay | Admitting: Internal Medicine

## 2020-06-05 DIAGNOSIS — I10 Essential (primary) hypertension: Secondary | ICD-10-CM

## 2020-06-20 ENCOUNTER — Other Ambulatory Visit: Payer: Self-pay

## 2020-06-20 ENCOUNTER — Ambulatory Visit (INDEPENDENT_AMBULATORY_CARE_PROVIDER_SITE_OTHER)
Admission: RE | Admit: 2020-06-20 | Discharge: 2020-06-20 | Disposition: A | Discharge status: Home / Self Care | Payer: Medicare Other | Source: Ambulatory Visit | Attending: Acute Care | Admitting: Acute Care

## 2020-06-20 ENCOUNTER — Other Ambulatory Visit: Payer: Self-pay | Admitting: Internal Medicine

## 2020-06-20 DIAGNOSIS — Z1231 Encounter for screening mammogram for malignant neoplasm of breast: Secondary | ICD-10-CM

## 2020-06-20 DIAGNOSIS — Z122 Encounter for screening for malignant neoplasm of respiratory organs: Secondary | ICD-10-CM

## 2020-06-20 DIAGNOSIS — F17210 Nicotine dependence, cigarettes, uncomplicated: Secondary | ICD-10-CM

## 2020-06-20 DIAGNOSIS — Z87891 Personal history of nicotine dependence: Secondary | ICD-10-CM | POA: Diagnosis not present

## 2020-06-23 NOTE — Progress Notes (Signed)
Please call patient and let them  know their  low dose Ct was read as a  Lung RADS 2: nodules that are benign in appearance and behavior with a very low likelihood of becoming a clinically active cancer due to size or lack of growth. Recommendation per radiology is for a repeat LDCT in 12 months. Please let them  know we will order and schedule their  annual screening scan for 05/2021. Please let them  know there was notation of CAD on their  scan.  Please remind the patient  that this is a non-gated exam therefore degree or severity of disease  cannot be determined. Please have them  follow up with their PCP regarding potential risk factor modification, dietary therapy or pharmacologic therapy if clinically indicated. Pt.  is  not currently on statin therapy. Please place order for annual  screening scan for  05/2021 and fax results to PCP. Thanks so much.

## 2020-06-29 ENCOUNTER — Other Ambulatory Visit: Payer: Self-pay | Admitting: *Deleted

## 2020-06-29 DIAGNOSIS — Z87891 Personal history of nicotine dependence: Secondary | ICD-10-CM

## 2020-07-01 ENCOUNTER — Other Ambulatory Visit: Payer: Self-pay

## 2020-07-01 ENCOUNTER — Ambulatory Visit
Admission: RE | Admit: 2020-07-01 | Discharge: 2020-07-01 | Disposition: A | Discharge status: Home / Self Care | Payer: Medicare Other | Source: Ambulatory Visit | Attending: Internal Medicine | Admitting: Internal Medicine

## 2020-07-01 DIAGNOSIS — Z1231 Encounter for screening mammogram for malignant neoplasm of breast: Secondary | ICD-10-CM

## 2020-07-05 LAB — HM MAMMOGRAPHY

## 2020-07-07 ENCOUNTER — Ambulatory Visit (INDEPENDENT_AMBULATORY_CARE_PROVIDER_SITE_OTHER): Payer: Medicare Other | Admitting: Internal Medicine

## 2020-07-07 ENCOUNTER — Encounter: Payer: Self-pay | Admitting: Internal Medicine

## 2020-07-07 ENCOUNTER — Other Ambulatory Visit: Payer: Self-pay

## 2020-07-07 VITALS — BP 142/78 | HR 78 | Temp 98.5°F | Resp 16 | Ht 67.0 in | Wt 195.0 lb

## 2020-07-07 DIAGNOSIS — Z23 Encounter for immunization: Secondary | ICD-10-CM | POA: Diagnosis not present

## 2020-07-07 DIAGNOSIS — E785 Hyperlipidemia, unspecified: Secondary | ICD-10-CM

## 2020-07-07 DIAGNOSIS — I10 Essential (primary) hypertension: Secondary | ICD-10-CM

## 2020-07-07 DIAGNOSIS — I700 Atherosclerosis of aorta: Secondary | ICD-10-CM | POA: Diagnosis not present

## 2020-07-07 DIAGNOSIS — J309 Allergic rhinitis, unspecified: Secondary | ICD-10-CM

## 2020-07-07 DIAGNOSIS — Z124 Encounter for screening for malignant neoplasm of cervix: Secondary | ICD-10-CM | POA: Insufficient documentation

## 2020-07-07 DIAGNOSIS — Z0000 Encounter for general adult medical examination without abnormal findings: Secondary | ICD-10-CM | POA: Diagnosis not present

## 2020-07-07 LAB — BASIC METABOLIC PANEL WITH GFR
BUN: 14 mg/dL (ref 7–25)
CO2: 26 mmol/L (ref 20–32)
Calcium: 9.9 mg/dL (ref 8.6–10.4)
Chloride: 103 mmol/L (ref 98–110)
Creat: 0.76 mg/dL (ref 0.50–1.05)
GFR, Est African American: 100 mL/min/{1.73_m2} (ref >=60)
GFR, Est Non African American: 86 mL/min/{1.73_m2} (ref >=60)
Glucose, Bld: 89 mg/dL (ref 65–99)
Potassium: 4.6 mmol/L (ref 3.5–5.3)
Sodium: 138 mmol/L (ref 135–146)

## 2020-07-07 LAB — LIPID PANEL
Cholesterol: 196 mg/dL (ref <200)
HDL: 57 mg/dL (ref >=50)
LDL Cholesterol (Calc): 118 mg/dL (calc) — ABNORMAL HIGH
Non-HDL Cholesterol (Calc): 139 mg/dL (calc) — ABNORMAL HIGH (ref <130)
Total CHOL/HDL Ratio: 3.4 (calc) (ref <5.0)
Triglycerides: 100 mg/dL (ref <150)

## 2020-07-07 LAB — HEPATIC FUNCTION PANEL
AG Ratio: 1.3 (calc) (ref 1.0–2.5)
ALT: 16 U/L (ref 6–29)
AST: 18 U/L (ref 10–35)
Albumin: 4.5 g/dL (ref 3.6–5.1)
Alkaline phosphatase (APISO): 78 U/L (ref 37–153)
Bilirubin, Direct: 0.2 mg/dL (ref 0.0–0.2)
Globulin: 3.4 g/dL (calc) (ref 1.9–3.7)
Indirect Bilirubin: 1.1 mg/dL (calc) (ref 0.2–1.2)
Total Bilirubin: 1.3 mg/dL — ABNORMAL HIGH (ref 0.2–1.2)
Total Protein: 7.9 g/dL (ref 6.1–8.1)

## 2020-07-07 LAB — TSH: TSH: 1.67 mIU/L (ref 0.40–4.50)

## 2020-07-07 MED ORDER — INDAPAMIDE 1.25 MG PO TABS: ORAL_TABLET | ORAL | 1 refills | Status: DC

## 2020-07-07 MED ORDER — FLUTICASONE PROPIONATE 50 MCG/ACT NA SUSP: 2.0000 | Freq: Every day | NASAL | 6 refills | Status: DC

## 2020-07-07 MED ORDER — FLUTICASONE PROPIONATE 50 MCG/ACT NA SUSP: 2.0000 | Freq: Every day | NASAL | 1 refills | Status: DC

## 2020-07-07 MED ORDER — LEVOCETIRIZINE DIHYDROCHLORIDE 5 MG PO TABS: ORAL_TABLET | ORAL | 1 refills | Status: DC

## 2020-07-07 NOTE — Progress Notes (Signed)
Subjective:  Patient ID: [name], female    DOB: 01-Mar-1961  Age: 59 y.o. MRN: [id number]  CC: Annual Exam, Hypertension, and Hyperlipidemia  This visit occurred during the SARS-CoV-2 public health emergency.  Safety protocols were in place, including screening questions prior to the visit, additional usage of staff PPE, and extensive cleaning of exam room while observing appropriate contact time as indicated for disinfecting solutions.    HPI TRANISE FORREST presents for a CPX.  She is not currently taking an antihypertensive.  She has not been working on her lifestyle modifications and complains of weight gain.  She is active and denies any recent episodes of chest pain, shortness of breath, palpitations, edema, fatigue, or headache.  She complains of mild, chronic constipation.  The symptoms are well controlled with Linzess.  Outpatient Medications Prior to Visit  Medication Sig Dispense Refill  . ALPRAZolam (XANAX) 0.5 MG tablet TAKE 1 TABLET BY MOUTH AT BEDTIME AS NEEDED FOR ANXIETY 30 tablet 2  . HYDROcodone-acetaminophen (NORCO) 7.5-325 MG tablet Take 1 tablet by mouth every 8 (eight) hours as needed.    . ALPRAZolam (XANAX) 0.25 MG tablet Take 1-2 tabs (0.25mg -0.50mg ) 30-60 minutes before procedure. May repeat if needed.Do not drive. 4 tablet 0  . fluticasone (FLONASE) 50 MCG/ACT nasal spray Place 2 sprays into both nostrils daily. 16 g 6  . indapamide (LOZOL) 1.25 MG tablet TAKE 1 TABLET(1.25 MG) BY MOUTH DAILY 90 tablet 0  . levocetirizine (XYZAL) 5 MG tablet TAKE 1 TABLET(5 MG) BY MOUTH EVERY EVENING 90 tablet 1  . LINZESS 145 MCG CAPS capsule As needed.    Marland Kitchen azithromycin (ZITHROMAX) 250 MG tablet 2 tabs po qd x 1 day; 1 tablet per day x 4 days; (Patient not taking: Reported on 07/07/2020) 6 tablet 0  . methylPREDNISolone (MEDROL DOSEPAK) 4 MG TBPK tablet Taper as directed (Patient not taking: Reported on 07/07/2020) 21 tablet 0   No facility-administered medications prior  to visit.    ROS Review of Systems  Constitutional: Positive for unexpected weight change (wt gain). Negative for chills, diaphoresis and fatigue.  HENT: Positive for congestion, postnasal drip, rhinorrhea and sneezing. Negative for facial swelling, sinus pressure and trouble swallowing.   Eyes: Negative.   Respiratory: Negative for cough, chest tightness and shortness of breath.   Cardiovascular: Negative for chest pain, palpitations and leg swelling.  Gastrointestinal: Positive for constipation. Negative for abdominal pain, blood in stool, diarrhea, nausea and vomiting.  Genitourinary: Negative.  Negative for difficulty urinating, dysuria and hematuria.  Musculoskeletal: Negative.  Negative for arthralgias and myalgias.  Skin: Negative.  Negative for color change and rash.  Neurological: Negative.  Negative for dizziness, weakness, light-headedness and headaches.  Hematological: Negative.  Negative for adenopathy. Does not bruise/bleed easily.  Psychiatric/Behavioral: Negative.     Objective:  BP (!) 142/78   Pulse 78   Temp 98.5 F (36.9 C) (Oral)   Resp 16   Ht 5\' 7"  (1.702 m)   Wt 195 lb (88.5 kg)   SpO2 98%   BMI 30.54 kg/m   BP Readings from Last 3 Encounters:  07/07/20 (!) 142/78  03/01/20 (!) 152/106  01/05/20 139/83    Wt Readings from Last 3 Encounters:  07/07/20 195 lb (88.5 kg)  03/01/20 188 lb (85.3 kg)  01/05/20 186 lb (84.4 kg)    Physical Exam Vitals reviewed.  Constitutional:      Appearance: Normal appearance.  HENT:     Nose: Nose normal.  Mouth/Throat:     Mouth: Mucous membranes are moist.  Eyes:     General: No scleral icterus.    Conjunctiva/sclera: Conjunctivae normal.  Cardiovascular:     Rate and Rhythm: Normal rate and regular rhythm.     Heart sounds: No murmur heard.   Pulmonary:     Effort: Pulmonary effort is normal.     Breath sounds: No stridor. No wheezing, rhonchi or rales.  Abdominal:     General: Abdomen is flat.       Palpations: There is no mass.     Tenderness: There is no abdominal tenderness. There is no guarding.     Hernia: No hernia is present.  Musculoskeletal:        General: Normal range of motion.     Cervical back: Neck supple.     Right lower leg: No edema.     Left lower leg: No edema.  Lymphadenopathy:     Cervical: No cervical adenopathy.  Skin:    General: Skin is warm and dry.     Coloration: Skin is not pale.  Neurological:     General: No focal deficit present.     Mental Status: She is alert.  Psychiatric:        Mood and Affect: Mood normal.        Behavior: Behavior normal.     Lab Results  Component Value Date   WBC 7.2 03/01/2020   HGB 13.6 03/01/2020   HCT 42.1 03/01/2020   PLT 362.0 03/01/2020   GLUCOSE 89 07/07/2020   CHOL 196 07/07/2020   TRIG 100 07/07/2020   HDL 57 07/07/2020   LDLCALC 118 (H) 07/07/2020   ALT 16 07/07/2020   AST 18 07/07/2020   NA 138 07/07/2020   K 4.6 07/07/2020   CL 103 07/07/2020   CREATININE 0.76 07/07/2020   BUN 14 07/07/2020   CO2 26 07/07/2020   TSH 1.67 07/07/2020   HGBA1C 5.4 01/16/2018    MM 3D SCREEN BREAST BILATERAL  Result Date: 07/05/2020 CLINICAL DATA:  Screening. EXAM: DIGITAL SCREENING BILATERAL MAMMOGRAM WITH TOMO AND CAD COMPARISON:  Previous exam(s). ACR Breast Density Category b: There are scattered areas of fibroglandular density. FINDINGS: There are no findings suspicious for malignancy. Images were processed with CAD. IMPRESSION: No mammographic evidence of malignancy. A result letter of this screening mammogram will be mailed directly to the patient. RECOMMENDATION: Screening mammogram in one year. (Code:SM-B-01Y) BI-RADS CATEGORY  1: Negative. Electronically Signed   By: Abelardo Diesel M.D.   On: 07/05/2020 16:38    Assessment & Plan:   Margurite was seen today for annual exam, hypertension and hyperlipidemia.  Diagnoses and all orders for this visit:  Essential hypertension- Her blood pressure is not  adequately well controlled.  Labs are negative for secondary causes or endorgan damage.  In addition to improve lifestyle modifications she will restart indapamide. -     TSH; Future -     indapamide (LOZOL) 1.25 MG tablet; TAKE 1 TABLET(1.25 MG) BY MOUTH DAILY -     BASIC METABOLIC PANEL WITH GFR; Future -     BASIC METABOLIC PANEL WITH GFR -     TSH  Atherosclerosis of aorta (HCC) - Will address risk factor modifications. -     Lipid panel; Future -     Lipid panel -     Pitavastatin Calcium (LIVALO) 1 MG TABS; Take 1 tablet (1 mg total) by mouth daily.  Preventative health care- Exam completed,  labs reviewed, vaccines reviewed and updated, cancer screenings were addressed, patient education material was given.  Dyslipidemia, goal LDL below 100- She has a mildly elevated ASCVD risk for.  I have asked her to take a statin for CV risk reduction. -     Lipid panel; Future -     Hepatic function panel; Future -     Hepatic function panel -     Lipid panel -     Pitavastatin Calcium (LIVALO) 1 MG TABS; Take 1 tablet (1 mg total) by mouth daily.  Allergic rhinitis, unspecified seasonality, unspecified trigger -     levocetirizine (XYZAL) 5 MG tablet; TAKE 1 TABLET(5 MG) BY MOUTH EVERY EVENING -     fluticasone (FLONASE) 50 MCG/ACT nasal spray; Place 2 sprays into both nostrils daily.  Screening for cervical cancer -     Ambulatory referral to Gynecology  Other orders -     Discontinue: fluticasone (FLONASE) 50 MCG/ACT nasal spray; Place 2 sprays into both nostrils daily. -     Flu Vaccine QUAD 6+ mos PF IM (Fluarix Quad PF)   I have discontinued Natallie C. Marhefka's Linzess, azithromycin, and methylPREDNISolone. I am also having her start on Livalo. Additionally, I am having her maintain her HYDROcodone-acetaminophen, ALPRAZolam, levocetirizine, indapamide, and fluticasone.  Meds ordered this encounter  Medications  . DISCONTD: fluticasone (FLONASE) 50 MCG/ACT nasal spray    Sig:  Place 2 sprays into both nostrils daily.    Dispense:  16 g    Refill:  6  . levocetirizine (XYZAL) 5 MG tablet    Sig: TAKE 1 TABLET(5 MG) BY MOUTH EVERY EVENING    Dispense:  90 tablet    Refill:  1  . indapamide (LOZOL) 1.25 MG tablet    Sig: TAKE 1 TABLET(1.25 MG) BY MOUTH DAILY    Dispense:  90 tablet    Refill:  1  . fluticasone (FLONASE) 50 MCG/ACT nasal spray    Sig: Place 2 sprays into both nostrils daily.    Dispense:  48 g    Refill:  1  . Pitavastatin Calcium (LIVALO) 1 MG TABS    Sig: Take 1 tablet (1 mg total) by mouth daily.    Dispense:  90 tablet    Refill:  1     Follow-up: Return in about 6 months (around 01/04/2021).  Scarlette Calico, MD

## 2020-07-07 NOTE — Patient Instructions (Signed)

## 2020-07-08 MED ORDER — LIVALO 1 MG PO TABS: 1.0000 | ORAL_TABLET | Freq: Every day | ORAL | 1 refills | Status: DC

## 2020-07-21 ENCOUNTER — Encounter: Payer: Self-pay | Admitting: Internal Medicine

## 2020-07-21 ENCOUNTER — Other Ambulatory Visit: Payer: Self-pay

## 2020-07-21 ENCOUNTER — Telehealth: Payer: Self-pay

## 2020-07-21 DIAGNOSIS — I700 Atherosclerosis of aorta: Secondary | ICD-10-CM

## 2020-07-21 DIAGNOSIS — E785 Hyperlipidemia, unspecified: Secondary | ICD-10-CM

## 2020-07-21 MED ORDER — LIVALO 1 MG PO TABS: 1.0000 | ORAL_TABLET | Freq: Every day | ORAL | 1 refills | Status: DC

## 2020-07-21 NOTE — Telephone Encounter (Signed)
Correction: Pitavastatin Calcium was sent in for patient.

## 2020-10-06 ENCOUNTER — Ambulatory Visit (INDEPENDENT_AMBULATORY_CARE_PROVIDER_SITE_OTHER): Payer: Medicare Other

## 2020-10-06 ENCOUNTER — Other Ambulatory Visit: Payer: Self-pay

## 2020-10-06 ENCOUNTER — Encounter: Payer: Self-pay | Admitting: Internal Medicine

## 2020-10-06 ENCOUNTER — Ambulatory Visit (INDEPENDENT_AMBULATORY_CARE_PROVIDER_SITE_OTHER): Payer: Medicare Other | Admitting: Internal Medicine

## 2020-10-06 VITALS — BP 136/86 | HR 84 | Temp 98.5°F | Ht 67.0 in | Wt 192.0 lb

## 2020-10-06 DIAGNOSIS — R079 Chest pain, unspecified: Secondary | ICD-10-CM

## 2020-10-06 DIAGNOSIS — M791 Myalgia, unspecified site: Secondary | ICD-10-CM

## 2020-10-06 DIAGNOSIS — I10 Essential (primary) hypertension: Secondary | ICD-10-CM | POA: Diagnosis not present

## 2020-10-06 DIAGNOSIS — T466X5A Adverse effect of antihyperlipidemic and antiarteriosclerotic drugs, initial encounter: Secondary | ICD-10-CM

## 2020-10-06 LAB — CBC WITH DIFFERENTIAL/PLATELET
Basophils Absolute: 0.1 10*3/uL (ref 0.0–0.1)
Basophils Relative: 0.8 % (ref 0.0–3.0)
Eosinophils Absolute: 0.2 10*3/uL (ref 0.0–0.7)
Eosinophils Relative: 2.8 % (ref 0.0–5.0)
HCT: 40.5 % (ref 36.0–46.0)
Hemoglobin: 13.4 g/dL (ref 12.0–15.0)
Lymphocytes Relative: 42.6 % (ref 12.0–46.0)
Lymphs Abs: 2.7 10*3/uL (ref 0.7–4.0)
MCHC: 33 g/dL (ref 30.0–36.0)
MCV: 86.4 fl (ref 78.0–100.0)
Monocytes Absolute: 0.5 10*3/uL (ref 0.1–1.0)
Monocytes Relative: 7 % (ref 3.0–12.0)
Neutro Abs: 3 10*3/uL (ref 1.4–7.7)
Neutrophils Relative %: 46.8 % (ref 43.0–77.0)
Platelets: 367 10*3/uL (ref 150.0–400.0)
RBC: 4.69 Mil/uL (ref 3.87–5.11)
RDW: 12.7 % (ref 11.5–15.5)
WBC: 6.4 10*3/uL (ref 4.0–10.5)

## 2020-10-06 LAB — BASIC METABOLIC PANEL
BUN: 12 mg/dL (ref 6–23)
CO2: 29 mEq/L (ref 19–32)
Calcium: 9.8 mg/dL (ref 8.4–10.5)
Chloride: 100 mEq/L (ref 96–112)
Creatinine, Ser: 0.82 mg/dL (ref 0.40–1.20)
GFR: 78.25 mL/min (ref >60.00)
Glucose, Bld: 103 mg/dL — ABNORMAL HIGH (ref 70–99)
Potassium: 3.5 mEq/L (ref 3.5–5.1)
Sodium: 138 mEq/L (ref 135–145)

## 2020-10-06 LAB — TROPONIN I (HIGH SENSITIVITY): High Sens Troponin I: 4 ng/L (ref 2–17)

## 2020-10-06 LAB — D-DIMER, QUANTITATIVE: D-Dimer, Quant: 0.64 mcg/mL FEU — ABNORMAL HIGH (ref <0.50)

## 2020-10-06 LAB — CK: Total CK: 85 U/L (ref 7–177)

## 2020-10-06 LAB — BRAIN NATRIURETIC PEPTIDE: Pro B Natriuretic peptide (BNP): 38 pg/mL (ref 0.0–100.0)

## 2020-10-06 LAB — SEDIMENTATION RATE: Sed Rate: 44 mm/hr — ABNORMAL HIGH (ref 0–30)

## 2020-10-06 LAB — MAGNESIUM: Magnesium: 2.1 mg/dL (ref 1.5–2.5)

## 2020-10-06 NOTE — Progress Notes (Signed)
Subjective:  Patient ID: [name], female    DOB: Apr 22, 1961  Age: 59 y.o. MRN: [id number]  CC: Hypertension and Chest Pain  This visit occurred during the SARS-CoV-2 public health emergency.  Safety protocols were in place, including screening questions prior to the visit, additional usage of staff PPE, and extensive cleaning of exam room while observing appropriate contact time as indicated for disinfecting solutions.    HPI [name] presents for f/up - She has not felt well for the last 4 weeks.  She attributes this to when she started taking pitavastatin.  She complains of worsening muscle and joint aches.  She is taking hydrocodone/acetaminophen from pain management.  She also complains of a 4-week history of right-sided anterior chest wall pain that she describes as an achy sensation with movement.  She says the pain has recently moved to the sternum.  She has had some shortness of breath but no dyspnea on exertion, diaphoresis, dizziness, or lightheadedness.  Outpatient Medications Prior to Visit  Medication Sig Dispense Refill  . ALPRAZolam (XANAX) 0.5 MG tablet TAKE 1 TABLET BY MOUTH AT BEDTIME AS NEEDED FOR ANXIETY 30 tablet 2  . fluticasone (FLONASE) 50 MCG/ACT nasal spray Place 2 sprays into both nostrils daily. 48 g 1  . HYDROcodone-acetaminophen (NORCO) 7.5-325 MG tablet Take 1 tablet by mouth every 8 (eight) hours as needed.    . indapamide (LOZOL) 1.25 MG tablet TAKE 1 TABLET(1.25 MG) BY MOUTH DAILY 90 tablet 1  . levocetirizine (XYZAL) 5 MG tablet TAKE 1 TABLET(5 MG) BY MOUTH EVERY EVENING 90 tablet 1  . Pitavastatin Calcium (LIVALO) 1 MG TABS Take 1 tablet (1 mg total) by mouth daily. 90 tablet 1   No facility-administered medications prior to visit.    ROS Review of Systems  Constitutional: Negative for appetite change, chills, diaphoresis, fatigue and fever.  HENT: Negative.  Negative for sore throat and trouble swallowing.   Eyes: Negative.    Respiratory: Positive for shortness of breath. Negative for cough, chest tightness and wheezing.   Cardiovascular: Positive for chest pain. Negative for palpitations and leg swelling.  Gastrointestinal: Negative for abdominal pain, constipation, diarrhea, nausea and vomiting.  Endocrine: Negative.   Genitourinary: Negative.  Negative for difficulty urinating, dysuria and hematuria.  Musculoskeletal: Positive for arthralgias, myalgias and neck pain. Negative for back pain.  Skin: Negative.  Negative for color change, pallor and rash.  Neurological: Negative.  Negative for dizziness, weakness, light-headedness and headaches.  Hematological: Negative for adenopathy. Does not bruise/bleed easily.  Psychiatric/Behavioral: Negative.     Objective:  BP 136/86   Pulse 84   Temp 98.5 F (36.9 C) (Oral)   Ht 5\' 7"  (1.702 m)   Wt 192 lb (87.1 kg)   SpO2 97%   BMI 30.07 kg/m   BP Readings from Last 3 Encounters:  10/06/20 136/86  07/07/20 (!) 142/78  03/01/20 (!) 152/106    Wt Readings from Last 3 Encounters:  10/06/20 192 lb (87.1 kg)  07/07/20 195 lb (88.5 kg)  03/01/20 188 lb (85.3 kg)    Physical Exam Vitals reviewed.  Constitutional:      General: She is not in acute distress.    Appearance: She is well-developed. She is not ill-appearing, toxic-appearing or diaphoretic.  HENT:     Nose: Nose normal.     Mouth/Throat:     Mouth: Mucous membranes are moist.  Eyes:     General: No scleral icterus.    Conjunctiva/sclera: Conjunctivae normal.  Cardiovascular:     Rate and Rhythm: Normal rate and regular rhythm.     Heart sounds: Normal heart sounds. No murmur heard. No friction rub. No gallop.      Comments: EKG- NSR, 72 bpm Normal EKG Pulmonary:     Effort: Pulmonary effort is normal.     Breath sounds: No stridor. No wheezing, rhonchi or rales.  Abdominal:     General: Abdomen is flat. Bowel sounds are normal. There is no distension.     Palpations: Abdomen is  soft. There is no hepatomegaly, splenomegaly or mass.     Tenderness: There is no abdominal tenderness.  Musculoskeletal:     Cervical back: Neck supple.     Right lower leg: No edema.     Left lower leg: No edema.  Lymphadenopathy:     Cervical: No cervical adenopathy.  Skin:    General: Skin is warm and dry.     Findings: No rash.  Neurological:     General: No focal deficit present.     Mental Status: She is alert and oriented to person, place, and time. Mental status is at baseline.  Psychiatric:        Mood and Affect: Mood normal.        Behavior: Behavior normal.     Lab Results  Component Value Date   WBC 6.4 10/06/2020   HGB 13.4 10/06/2020   HCT 40.5 10/06/2020   PLT 367.0 10/06/2020   GLUCOSE 103 (H) 10/06/2020   CHOL 196 07/07/2020   TRIG 100 07/07/2020   HDL 57 07/07/2020   LDLCALC 118 (H) 07/07/2020   ALT 16 07/07/2020   AST 18 07/07/2020   NA 138 10/06/2020   K 3.5 10/06/2020   CL 100 10/06/2020   CREATININE 0.82 10/06/2020   BUN 12 10/06/2020   CO2 29 10/06/2020   TSH 1.67 07/07/2020   HGBA1C 5.4 01/16/2018    MM 3D SCREEN BREAST BILATERAL  Result Date: 07/05/2020 CLINICAL DATA:  Screening. EXAM: DIGITAL SCREENING BILATERAL MAMMOGRAM WITH TOMO AND CAD COMPARISON:  Previous exam(s). ACR Breast Density Category b: There are scattered areas of fibroglandular density. FINDINGS: There are no findings suspicious for malignancy. Images were processed with CAD. IMPRESSION: No mammographic evidence of malignancy. A result letter of this screening mammogram will be mailed directly to the patient. RECOMMENDATION: Screening mammogram in one year. (Code:[id number]-B-01Y) BI-RADS CATEGORY  1: Negative. Electronically Signed   By: Abelardo Diesel M.D.   On: 07/05/2020 16:38   DG Chest 2 View  Result Date: 10/06/2020 CLINICAL DATA:  RIGHT-side chest pain intermittently over past month, shortness of breath, former smoker, hypertension EXAM: CHEST - 2 VIEW COMPARISON:  05/28/2010  FINDINGS: Normal heart size, mediastinal contours, and pulmonary vascularity. Lungs clear. No infiltrate, pleural effusion, or pneumothorax. Prior cervical spine fusion. IMPRESSION: No acute abnormalities. Electronically Signed   By: Lavonia Dana M.D.   On: 10/06/2020 09:38    Assessment & Plan:   Marveline was seen today for hypertension and chest pain.  Diagnoses and all orders for this visit:  Essential hypertension- Her blood pressure is adequately well controlled. -     CBC with Differential/Platelet; Future -     Basic metabolic panel; Future -     Magnesium; Future -     Magnesium -     Basic metabolic panel -     CBC with Differential/Platelet  Chest pain at rest- Based on her symptoms, exam, chest x-ray, EKG, and labs I  do not think she has a significant cardiopulmonary process at play here.  I think this is musculoskeletal pain.  She will let me know if she develops any new or worsening symptoms. -     Troponin I (High Sensitivity); Future -     D-dimer, quantitative (not at [facility]); Future -     Brain natriuretic peptide; Future -     DG Chest 2 View; Future -     EKG 12-Lead -     Brain natriuretic peptide -     D-dimer, quantitative (not at [facility]) -     Troponin I (High Sensitivity)  Myalgia due to statin- Her CPK and sed rate are normal.  Will discontinue the statin. -     CK; Future -     Sedimentation rate; Future -     Sedimentation rate -     CK   I have discontinued [employer] Livalo. I am also having her maintain her HYDROcodone-acetaminophen, ALPRAZolam, levocetirizine, indapamide, and fluticasone.  No orders of the defined types were placed in this encounter.    Follow-up: No follow-ups on file.  Scarlette Calico, MD

## 2020-10-09 ENCOUNTER — Encounter: Payer: Self-pay | Admitting: Internal Medicine

## 2020-12-15 ENCOUNTER — Ambulatory Visit (INDEPENDENT_AMBULATORY_CARE_PROVIDER_SITE_OTHER): Payer: Medicare Other

## 2020-12-15 ENCOUNTER — Other Ambulatory Visit: Payer: Self-pay

## 2020-12-15 VITALS — BP 120/78 | HR 81 | Temp 98.4°F | Ht 67.0 in | Wt 194.6 lb

## 2020-12-15 DIAGNOSIS — Z0000 Encounter for general adult medical examination without abnormal findings: Secondary | ICD-10-CM

## 2020-12-15 NOTE — Patient Instructions (Signed)
[name] , Thank you for taking time to come for your Medicare Wellness Visit. I appreciate your ongoing commitment to your health goals. Please review the following plan we discussed and let me know if I can assist you in the future.   Screening recommendations/referrals: Colonoscopy: 09/02/2017; due every 5 years (08/2022) Mammogram: 07/05/2020; due every year Bone Density: never done Recommended yearly ophthalmology/optometry visit for glaucoma screening and checkup Recommended yearly dental visit for hygiene and checkup  Vaccinations: Influenza vaccine: 07/07/2020 Pneumococcal vaccine: declined Tdap vaccine: 09/09/2014; due every 10 years (08/2024) Shingles vaccine: never done  Covid-19: 01/02/2020, 02/02/2020, 10/10/2020  Advanced directives: Please bring a copy of your health care power of attorney and living will to the office at your convenience.  Conditions/risks identified: Yes; Reviewed health maintenance screenings with patient today and relevant education, vaccines, and/or referrals were provided. Please continue to do your personal lifestyle choices by: daily care of teeth and gums, regular physical activity (goal should be 5 days a week for 30 minutes), eat a healthy diet, avoid tobacco and drug use, limiting any alcohol intake, taking a low-dose aspirin (if not allergic or have been advised by your provider otherwise) and taking vitamins and minerals as recommended by your provider. Continue doing brain stimulating activities (puzzles, reading, adult coloring books, staying active) to keep memory sharp. Continue to eat heart healthy diet (full of fruits, vegetables, whole grains, lean protein, water--limit salt, fat, and sugar intake) and increase physical activity as tolerated.   Next appointment: Please schedule your next Medicare Wellness Visit with your Nurse Health Advisor in 1 year by calling [phone].  Preventive Care 40-64 Years, Female Preventive care refers to  lifestyle choices and visits with your health care provider that can promote health and wellness. What does preventive care include?  A yearly physical exam. This is also called an annual well check.  Dental exams once or twice a year.  Routine eye exams. Ask your health care provider how often you should have your eyes checked.  Personal lifestyle choices, including:  Daily care of your teeth and gums.  Regular physical activity.  Eating a healthy diet.  Avoiding tobacco and drug use.  Limiting alcohol use.  Practicing safe sex.  Taking low-dose aspirin daily starting at age 20.  Taking vitamin and mineral supplements as recommended by your health care provider. What happens during an annual well check? The services and screenings done by your health care provider during your annual well check will depend on your age, overall health, lifestyle risk factors, and family history of disease. Counseling  Your health care provider may ask you questions about your:  Alcohol use.  Tobacco use.  Drug use.  Emotional well-being.  Home and relationship well-being.  Sexual activity.  Eating habits.  Work and work Statistician.  Method of birth control.  Menstrual cycle.  Pregnancy history. Screening  You may have the following tests or measurements:  Height, weight, and BMI.  Blood pressure.  Lipid and cholesterol levels. These may be checked every 5 years, or more frequently if you are over 45 years old.  Skin check.  Lung cancer screening. You may have this screening every year starting at age 33 if you have a 30-pack-year history of smoking and currently smoke or have quit within the past 15 years.  Fecal occult blood test (FOBT) of the stool. You may have this test every year starting at age 1.  Flexible sigmoidoscopy or colonoscopy. You may have a sigmoidoscopy every  5 years or a colonoscopy every 10 years starting at age 55.  Hepatitis C blood  test.  Hepatitis B blood test.  Sexually transmitted disease (STD) testing.  Diabetes screening. This is done by checking your blood sugar (glucose) after you have not eaten for a while (fasting). You may have this done every 1-3 years.  Mammogram. This may be done every 1-2 years. Talk to your health care provider about when you should start having regular mammograms. This may depend on whether you have a family history of breast cancer.  BRCA-related cancer screening. This may be done if you have a family history of breast, ovarian, tubal, or peritoneal cancers.  Pelvic exam and Pap test. This may be done every 3 years starting at age 27. Starting at age 47, this may be done every 5 years if you have a Pap test in combination with an HPV test.  Bone density scan. This is done to screen for osteoporosis. You may have this scan if you are at high risk for osteoporosis. Discuss your test results, treatment options, and if necessary, the need for more tests with your health care provider. Vaccines  Your health care provider may recommend certain vaccines, such as:  Influenza vaccine. This is recommended every year.  Tetanus, diphtheria, and acellular pertussis (Tdap, Td) vaccine. You may need a Td booster every 10 years.  Zoster vaccine. You may need this after age 58.  Pneumococcal 13-valent conjugate (PCV13) vaccine. You may need this if you have certain conditions and were not previously vaccinated.  Pneumococcal polysaccharide (PPSV23) vaccine. You may need one or two doses if you smoke cigarettes or if you have certain conditions. Talk to your health care provider about which screenings and vaccines you need and how often you need them. This information is not intended to replace advice given to you by your health care provider. Make sure you discuss any questions you have with your health care provider. Document Released: 11/11/2015 Document Revised: 07/04/2016 Document Reviewed:  08/16/2015 Elsevier Interactive Patient Education  2017 [place] Prevention in the Home Falls can cause injuries. They can happen to people of all ages. There are many things you can do to make your home safe and to help prevent falls. What can I do on the outside of my home?  Regularly fix the edges of walkways and driveways and fix any cracks.  Remove anything that might make you trip as you walk through a door, such as a raised step or threshold.  Trim any bushes or trees on the path to your home.  Use bright outdoor lighting.  Clear any walking paths of anything that might make someone trip, such as rocks or tools.  Regularly check to see if handrails are loose or broken. Make sure that both sides of any steps have handrails.  Any raised decks and porches should have guardrails on the edges.  Have any leaves, snow, or ice cleared regularly.  Use sand or salt on walking paths during winter.  Clean up any spills in your garage right away. This includes oil or grease spills. What can I do in the bathroom?  Use night lights.  Install grab bars by the toilet and in the tub and shower. Do not use towel bars as grab bars.  Use non-skid mats or decals in the tub or shower.  If you need to sit down in the shower, use a plastic, non-slip stool.  Keep the floor dry.  Clean up any water that spills on the floor as soon as it happens.  Remove soap buildup in the tub or shower regularly.  Attach bath mats securely with double-sided non-slip rug tape.  Do not have throw rugs and other things on the floor that can make you trip. What can I do in the bedroom?  Use night lights.  Make sure that you have a light by your bed that is easy to reach.  Do not use any sheets or blankets that are too big for your bed. They should not hang down onto the floor.  Have a firm chair that has side arms. You can use this for support while you get dressed.  Do not have throw  rugs and other things on the floor that can make you trip. What can I do in the kitchen?  Clean up any spills right away.  Avoid walking on wet floors.  Keep items that you use a lot in easy-to-reach places.  If you need to reach something above you, use a strong step stool that has a grab bar.  Keep electrical cords out of the way.  Do not use floor polish or wax that makes floors slippery. If you must use wax, use non-skid floor wax.  Do not have throw rugs and other things on the floor that can make you trip. What can I do with my stairs?  Do not leave any items on the stairs.  Make sure that there are handrails on both sides of the stairs and use them. Fix handrails that are broken or loose. Make sure that handrails are as long as the stairways.  Check any carpeting to make sure that it is firmly attached to the stairs. Fix any carpet that is loose or worn.  Avoid having throw rugs at the top or bottom of the stairs. If you do have throw rugs, attach them to the floor with carpet tape.  Make sure that you have a light switch at the top of the stairs and the bottom of the stairs. If you do not have them, ask someone to add them for you. What else can I do to help prevent falls?  Wear shoes that:  Do not have high heels.  Have rubber bottoms.  Are comfortable and fit you well.  Are closed at the toe. Do not wear sandals.  If you use a stepladder:  Make sure that it is fully opened. Do not climb a closed stepladder.  Make sure that both sides of the stepladder are locked into place.  Ask someone to hold it for you, if possible.  Clearly mark and make sure that you can see:  Any grab bars or handrails.  First and last steps.  Where the edge of each step is.  Use tools that help you move around (mobility aids) if they are needed. These include:  Canes.  Walkers.  Scooters.  Crutches.  Turn on the lights when you go into a dark area. Replace any light  bulbs as soon as they burn out.  Set up your furniture so you have a clear path. Avoid moving your furniture around.  If any of your floors are uneven, fix them.  If there are any pets around you, be aware of where they are.  Review your medicines with your doctor. Some medicines can make you feel dizzy. This can increase your chance of falling. Ask your doctor what other things that you can do to help prevent  falls. This information is not intended to replace advice given to you by your health care provider. Make sure you discuss any questions you have with your health care provider. Document Released: 08/11/2009 Document Revised: 03/22/2016 Document Reviewed: 11/19/2014 Elsevier Interactive Patient Education  2017 [employer].

## 2020-12-15 NOTE — Progress Notes (Addendum)
Subjective:   [name] is a 60 y.o. female who presents for Medicare Annual (Subsequent) preventive examination.  Review of Systems    No ROS. Medicare Wellness Visit. Cardiac Risk Factors include: family history of premature cardiovascular disease;hypertension;obesity (BMI >30kg/m2)     Objective:    Today's Vitals   12/15/20 1032 12/15/20 1033  BP: 120/78   Pulse: 81   Temp: 98.4 F (36.9 C)   SpO2: 98%   Weight: 194 lb 9.6 oz (88.3 kg)   Height: 5\' 7"  (1.702 m)   PainSc: 0-No pain 0-No pain   Body mass index is 30.48 kg/m.  Advanced Directives 12/15/2020 08/20/2017 02/21/2017 07/26/2016 07/12/2016 01/19/2015  Does Patient Have a Medical Advance Directive? No No No No No No  Would patient like information on creating a medical advance directive? Yes (MAU/Ambulatory/Procedural Areas - Information given) - - No - patient declined information No - patient declined information No - patient declined information    Current Medications (verified) Outpatient Encounter Medications as of 12/15/2020  Medication Sig   ALPRAZolam (XANAX) 0.5 MG tablet TAKE 1 TABLET BY MOUTH AT BEDTIME AS NEEDED FOR ANXIETY   fluticasone (FLONASE) 50 MCG/ACT nasal spray Place 2 sprays into both nostrils daily.   HYDROcodone-acetaminophen (NORCO) 7.5-325 MG tablet Take 1 tablet by mouth every 8 (eight) hours as needed.   indapamide (LOZOL) 1.25 MG tablet TAKE 1 TABLET(1.25 MG) BY MOUTH DAILY   levocetirizine (XYZAL) 5 MG tablet TAKE 1 TABLET(5 MG) BY MOUTH EVERY EVENING   No facility-administered encounter medications on file as of 12/15/2020.    Allergies (verified) Livalo [pitavastatin], Anesthesia s-i-40 [propofol], Augmentin [amoxicillin-pot clavulanate], and Doxycycline   History: Past Medical History:  Diagnosis Date   Chest pain    Depression    DJD (degenerative joint disease)    GERD (gastroesophageal reflux disease)    History of palpitations    Migraine headache    PVC's  (premature ventricular contractions)    RVOT ventricular tachycardia (HCC)    Past Surgical History:  Procedure Laterality Date   BACK SURGERY  10/23/12   CERVICAL FUSION     CHOLECYSTECTOMY     LAMINECTOMY     MRI     every 3 mths /due to menigitis   TOTAL ABDOMINAL HYSTERECTOMY     TUBAL LIGATION     Family History  Problem Relation Age of Onset   Diabetes Mother    Multiple sclerosis Mother    Heart disease Father    Hypertension Sister    Kidney disease Sister    Colon cancer Neg Hx    Social History   Socioeconomic History   Marital status: Married    Spouse name: Not on file   Number of children: 2   Years of education: 12   Highest education level: Not on file  Occupational History   Occupation: Retired/disabled  Tobacco Use   Smoking status: Former Smoker    Packs/day: 1.00    Years: 36.00    Pack years: 36.00    Types: Cigarettes    Quit date: 09/2019    Years since quitting: 1.2   Smokeless tobacco: Never Used  Vaping Use   Vaping Use: Never used  Substance and Sexual Activity   Alcohol use: No   Drug use: No   Sexual activity: Yes  Other Topics Concern   Not on file  Social History Narrative   Occupation: retired [employer]   Married  Alcohol no   Drug use no, no caffiene   Tobacco use- yes quit Dec '08   Regular Exercise no   4 children; reports increased stress in due to caring for grandchildren   UNEMPLOYED      Lives at home w/ her husband   Right-handed   Caffeine: 1 cup of coffee    Social Determinants of Health   Financial Resource Strain: Low Risk    Difficulty of Paying Living Expenses: Not hard at all  Food Insecurity: No Food Insecurity   Worried About Charity fundraiser in the Last Year: Never true   [place] in the Last Year: Never true  Transportation Needs: No Transportation Needs   Lack of Transportation (Medical): No   Lack of Transportation (Non-Medical): No  Physical Activity:  Inactive   Days of Exercise per Week: 0 days   Minutes of Exercise per Session: 0 min  Stress: No Stress Concern Present   Feeling of Stress : Not at all  Social Connections: Socially Integrated   Frequency of Communication with Friends and Family: More than three times a week   Frequency of Social Gatherings with Friends and Family: Once a week   Attends Religious Services: More than 4 times per year   Active Member of [employer] or Organizations: Yes   Attends Music therapist: More than 4 times per year   Marital Status: Married    Tobacco Counseling Counseling given: Not Answered   Clinical Intake:  Pre-visit preparation completed: Yes  Pain : No/denies pain Pain Score: 0-No pain     BMI - recorded: 30.48 Nutritional Status: BMI > 30  Obese Nutritional Risks: None Diabetes: No  How often do you need to have someone help you when you read instructions, pamphlets, or other written materials from your doctor or pharmacy?: 1 - Never What is the last grade level you completed in school?: [place]; 2 years of college; Retired for [employer].  Diabetic? no  Interpreter Needed?: No  Information entered by :: Lisette Abu, LPN.   Activities of Daily Living In your present state of health, do you have any difficulty performing the following activities: 12/15/2020  Hearing? N  Vision? N  Difficulty concentrating or making decisions? N  Walking or climbing stairs? N  Dressing or bathing? N  Doing errands, shopping? N  Preparing Food and eating ? N  Using the Toilet? N  In the past six months, have you accidently leaked urine? N  Do you have problems with loss of bowel control? N  Managing your Medications? N  Managing your Finances? N  Housekeeping or managing your Housekeeping? N  Some recent data might be hidden    Patient Care Team: Janith Lima, MD as PCP - General (Internal Medicine)  Indicate any recent Medical  Services you may have received from other than [facility] providers in the past year (date may be approximate).     Assessment:   This is a routine wellness examination for [facility].  Hearing/Vision screen No exam data present  Dietary issues and exercise activities discussed: Current Exercise Habits: The patient does not participate in regular exercise at present, Exercise limited by: None identified  Goals      Patient Stated     My goal is to lose weight and get more active.  My ideal weight goal is 170 lbs.       Depression Screen PHQ 2/9 Scores 12/15/2020 10/06/2020  06/22/2019 03/22/2016  PHQ - 2 Score 0 0 0 0    Fall Risk Fall Risk  12/15/2020 06/22/2019 03/22/2016  Falls in the past year? 0 0 No  Number falls in past yr: 0 0 -  Injury with Fall? 0 0 -  Risk for fall due to : No Fall Risks - -  Follow up - Falls evaluation completed -    FALL RISK PREVENTION PERTAINING TO THE HOME:  Any stairs in or around the home? No  If so, are there any without handrails? No  Home free of loose throw rugs in walkways, pet beds, electrical cords, etc? Yes  Adequate lighting in your home to reduce risk of falls? Yes   ASSISTIVE DEVICES UTILIZED TO PREVENT FALLS:  Life alert? Yes  Use of a cane, walker or w/c? No  Grab bars in the bathroom? No  Shower chair or bench in shower? Yes  Elevated toilet seat or a handicapped toilet? Yes   TIMED UP AND GO:  Was the test performed? No .  Length of time to ambulate 10 feet: 0 sec.   Gait steady and fast without use of assistive device  Cognitive Function: Normal cognitive status assessed by direct observation by this Nurse Health Advisor. No abnormalities found.          Immunizations Immunization History  Administered Date(s) Administered   Influenza Split 11/24/2012   Influenza,inj,Quad PF,6+ Mos 08/20/2014, 12/02/2015, 08/20/2017, 06/22/2019, 07/07/2020   PFIZER(Purple Top)SARS-COV-2 Vaccination 01/02/2020, 02/02/2020, 10/10/2020    Tdap 09/09/2014    TDAP status: Up to date  Flu Vaccine status: Up to date  Pneumococcal vaccine status: Declined,  Education has been provided regarding the importance of this vaccine but patient still declined. Advised may receive this vaccine at local pharmacy or Health Dept. Aware to provide a copy of the vaccination record if obtained from local pharmacy or Health Dept. Verbalized acceptance and understanding.   Covid-19 vaccine status: Completed vaccines  Qualifies for Shingles Vaccine? Yes   Zostavax completed No   Shingrix Completed?: No.    Education has been provided regarding the importance of this vaccine. Patient has been advised to call insurance company to determine out of pocket expense if they have not yet received this vaccine. Advised may also receive vaccine at local pharmacy or Health Dept. Verbalized acceptance and understanding.  Screening Tests Health Maintenance  Topic Date Due   MAMMOGRAM  07/05/2022   COLONOSCOPY (Pts 45-58yrs Insurance coverage will need to be confirmed)  09/02/2022   TETANUS/TDAP  09/09/2024   INFLUENZA VACCINE  Completed   COVID-19 Vaccine  Completed   Hepatitis C Screening  Completed   HIV Screening  Completed    Health Maintenance  There are no preventive care reminders to display for this patient.  Colorectal cancer screening: Type of screening: Colonoscopy. Completed 09/02/2017. Repeat every 5 years  Mammogram status: Completed 07/05/2020. Repeat every year  Bone density: never done  Lung Cancer Screening: (Low Dose CT Chest recommended if Age 32-80 years, 30 pack-year currently smoking OR have quit w/in 15years.) does qualify.   Lung Cancer Screening Referral: no  Additional Screening:  Hepatitis C Screening: does qualify; Completed yes  Vision Screening: Recommended annual ophthalmology exams for early detection of glaucoma and other disorders of the eye. Is the patient up to date with their annual eye exam?  Yes  Who is  the provider or what is the name of the office in which the patient attends annual eye exams?  [facility], [place] If pt is not established with a provider, would they like to be referred to a provider to establish care? No .   Dental Screening: Recommended annual dental exams for proper oral hygiene  Community Resource Referral / Chronic Care Management: CRR required this visit?  No   CCM required this visit?  No      Plan:     I have personally reviewed and noted the following in the patient's chart:   Medical and social history Use of alcohol, tobacco or illicit drugs  Current medications and supplements Functional ability and status Nutritional status Physical activity Advanced directives List of other physicians Hospitalizations, surgeries, and ER visits in previous 12 months Vitals Screenings to include cognitive, depression, and falls Referrals and appointments  In addition, I have reviewed and discussed with patient certain preventive protocols, quality metrics, and best practice recommendations. A written personalized care plan for preventive services as well as general preventive health recommendations were provided to patient.     Sheral Flow, LPN   0/[id number]/[id number]   Nurse Notes:  Medications reviewed with patient; patient is using opioids for pain management.   Medical screening examination/treatment/procedure(s) were performed by non-physician practitioner and as supervising physician I was immediately available for consultation/collaboration.  I agree with above. Lew Dawes, MD

## 2020-12-20 ENCOUNTER — Other Ambulatory Visit: Payer: Self-pay | Admitting: Internal Medicine

## 2020-12-20 DIAGNOSIS — F411 Generalized anxiety disorder: Secondary | ICD-10-CM

## 2021-02-11 ENCOUNTER — Other Ambulatory Visit: Payer: Self-pay | Admitting: Internal Medicine

## 2021-02-11 DIAGNOSIS — J309 Allergic rhinitis, unspecified: Secondary | ICD-10-CM

## 2021-02-12 ENCOUNTER — Emergency Department (HOSPITAL_COMMUNITY)
Admission: EM | Admit: 2021-02-12 | Discharge: 2021-02-12 | Disposition: A | Discharge status: Home / Self Care | Payer: Medicare Other | Attending: Emergency Medicine | Admitting: Emergency Medicine

## 2021-02-12 ENCOUNTER — Encounter (HOSPITAL_COMMUNITY): Payer: Self-pay | Admitting: *Deleted

## 2021-02-12 ENCOUNTER — Other Ambulatory Visit: Payer: Self-pay

## 2021-02-12 DIAGNOSIS — I10 Essential (primary) hypertension: Secondary | ICD-10-CM | POA: Diagnosis not present

## 2021-02-12 DIAGNOSIS — Z87891 Personal history of nicotine dependence: Secondary | ICD-10-CM | POA: Insufficient documentation

## 2021-02-12 DIAGNOSIS — R42 Dizziness and giddiness: Secondary | ICD-10-CM | POA: Diagnosis not present

## 2021-02-12 DIAGNOSIS — Z79899 Other long term (current) drug therapy: Secondary | ICD-10-CM | POA: Diagnosis not present

## 2021-02-12 LAB — BASIC METABOLIC PANEL
Anion gap: 8 (ref 5–15)
BUN: 10 mg/dL (ref 6–20)
CO2: 24 mmol/L (ref 22–32)
Calcium: 9.4 mg/dL (ref 8.9–10.3)
Chloride: 105 mmol/L (ref 98–111)
Creatinine, Ser: 1.02 mg/dL — ABNORMAL HIGH (ref 0.44–1.00)
GFR, Estimated: >60 mL/min (ref >60)
Glucose, Bld: 117 mg/dL — ABNORMAL HIGH (ref 70–99)
Potassium: 3.8 mmol/L (ref 3.5–5.1)
Sodium: 137 mmol/L (ref 135–145)

## 2021-02-12 LAB — CBC
HCT: 42.1 % (ref 36.0–46.0)
Hemoglobin: 13.4 g/dL (ref 12.0–15.0)
MCH: 28.5 pg (ref 26.0–34.0)
MCHC: 31.8 g/dL (ref 30.0–36.0)
MCV: 89.4 fL (ref 80.0–100.0)
Platelets: 385 10*3/uL (ref 150–400)
RBC: 4.71 MIL/uL (ref 3.87–5.11)
RDW: 13.3 % (ref 11.5–15.5)
WBC: 8.8 10*3/uL (ref 4.0–10.5)
nRBC: 0 % (ref 0.0–0.2)

## 2021-02-12 NOTE — ED Triage Notes (Signed)
Pt with dizziness since yesterday around lunch time.  Pt checked BP at home and was high per pt.  Also c/o sinus pressure.

## 2021-02-12 NOTE — Discharge Instructions (Addendum)
You were evaluated in the Emergency Department and after careful evaluation, we did not find any emergent condition requiring admission or further testing in the hospital.  Please make sure to drink plenty of water, take your blood pressure medicines as directed.  Please return to the ER for any new or worsening symptoms.  Please return to the Emergency Department if you experience any worsening of your condition.  We encourage you to follow up with a primary care provider.  Thank you for allowing [place] to be a part of your care.

## 2021-02-12 NOTE — ED Provider Notes (Signed)
[facility] Provider Note   CSN: [id number] Arrival date & time: 02/12/21  1752     History Chief Complaint  Patient presents with  . Hypertension    Dizziness     [name] is a 60 y.o. female.  HPI 60 year old female with a history of chest pain, depression, GERD, migraines presents to the ER with complaints of 1 day of dizziness, and high blood pressure readings.  States that she started to experience some dizziness at around lunchtime, checked her blood pressure which was about 140/80.  She denies any history of high blood pressure, however per chart review patient is followed by low by her primary care and she does have hypertension on her problem list.  It was adequately controlled.  In September she was prescribed Lozol, which the patient states that she has.  She has not been taking it regularly, but did take 1 pill after she started to feel dizzy.  States dizziness is worse when she stands up.  Presently not dizzy when she is sitting.  She denies any facial droop, blurry vision, mouth droop, word slurring.  She states that she does have some left-sided arm weakness, but this is chronic as she has a cervical spinal fusion.  She denies any chest pain, shortness of breath, cough, syncope.  No prior history of stroke.  Not on anticoagulation.  She reports eating and drinking well.  She is also states that she suffers from chronic sinus issues which she is not sure is contributing to her symptoms.    Past Medical History:  Diagnosis Date  . Chest pain   . Depression   . DJD (degenerative joint disease)   . GERD (gastroesophageal reflux disease)   . History of palpitations   . Migraine headache   . PVC's (premature ventricular contractions)   . RVOT ventricular tachycardia [facility])     Patient Active Problem List   Diagnosis Date Noted  . Chest pain at rest 10/06/2020  . Screening for cervical cancer 07/07/2020  . Essential hypertension 03/01/2020  .  Occipital neuralgia of right side 01/05/2020  . Atherosclerosis of aorta ([place]) 03/31/2019  . Dyslipidemia, goal LDL below 100 03/31/2019  . Visit for screening mammogram 03/31/2019  . Allergic rhinitis 02/22/2017  . Overweight (BMI 25.0-29.9) 03/22/2016  . Preventative health care 09/09/2014  . Tobacco abuse 11/25/2012  . GAD (generalized anxiety disorder) 04/13/2010  . PREMATURE VENTRICULAR CONTRACTIONS 02/06/2010  . ELECTROCARDIOGRAM, ABNORMAL 02/06/2010  . MIGRAINE HEADACHE 07/24/2007  . GASTROESOPHAGEAL REFLUX DISEASE 07/24/2007    Past Surgical History:  Procedure Laterality Date  . BACK SURGERY  10/23/12  . CERVICAL FUSION    . CHOLECYSTECTOMY    . LAMINECTOMY    . MRI     every 3 mths /due to menigitis  . TOTAL ABDOMINAL HYSTERECTOMY    . TUBAL LIGATION       OB History   No obstetric history on file.     Family History  Problem Relation Age of Onset  . Diabetes Mother   . Multiple sclerosis Mother   . Heart disease Father   . Hypertension Sister   . Kidney disease Sister   . Colon cancer Neg Hx     Social History   Tobacco Use  . Smoking status: Former Smoker    Packs/day: 1.00    Years: 36.00    Pack years: 36.00    Types: Cigarettes    Quit date: 09/2019    Years  since quitting: 1.3  . Smokeless tobacco: Never Used  Vaping Use  . Vaping Use: Never used  Substance Use Topics  . Alcohol use: No  . Drug use: No    Home Medications Prior to Admission medications   Medication Sig Start Date End Date Taking? Authorizing Provider  ALPRAZolam (XANAX) 0.5 MG tablet TAKE 1 TABLET BY MOUTH AT BEDTIME AS NEEDED FOR ANXIETY Patient taking differently: Take 0.5 mg by mouth. 12/21/20  Yes Janith Lima, MD  fluticasone (FLONASE) 50 MCG/ACT nasal spray Place 2 sprays into both nostrils daily. 07/07/20  Yes Janith Lima, MD  HYDROcodone-acetaminophen (NORCO) 7.5-325 MG tablet Take 1 tablet by mouth every 8 (eight) hours as needed. 11/26/19  Yes [provider]  indapamide (LOZOL) 1.25 MG tablet TAKE 1 TABLET(1.25 MG) BY MOUTH DAILY 07/07/20  Yes Janith Lima, MD  LINZESS 145 MCG CAPS capsule Take 145 mcg by mouth daily. 01/12/21  Yes [provider]  tiZANidine (ZANAFLEX) 2 MG tablet Take 2 mg by mouth 3 (three) times daily as needed. 01/11/21  Yes [provider]  levocetirizine (XYZAL) 5 MG tablet TAKE 1 TABLET(5 MG) BY MOUTH EVERY EVENING Patient taking differently: Take 5 mg by mouth every evening. 02/11/21   Janith Lima, MD    Allergies    Livalo [pitavastatin], Anesthesia s-i-40 [propofol], Augmentin [amoxicillin-pot clavulanate], and Doxycycline  Review of Systems   Review of Systems  Constitutional: Negative for chills and fever.  HENT: Negative for ear pain and sore throat.   Eyes: Negative for pain and visual disturbance.  Respiratory: Negative for cough and shortness of breath.   Cardiovascular: Negative for chest pain and palpitations.  Gastrointestinal: Negative for abdominal pain and vomiting.  Genitourinary: Negative for dysuria and hematuria.  Musculoskeletal: Negative for arthralgias and back pain.  Skin: Negative for color change and rash.  Neurological: Positive for dizziness. Negative for seizures and syncope.  All other systems reviewed and are negative.   Physical Exam Updated Vital Signs BP (!) 139/92 (BP Location: Left Arm)   Pulse 94   Temp 98.5 F (36.9 C) (Oral)   Resp 17   Ht 5\' 7"  (1.702 m)   Wt 89.8 kg   SpO2 100%   BMI 31.01 kg/m   Physical Exam Vitals and nursing note reviewed.  Constitutional:      General: She is not in acute distress.    Appearance: She is well-developed.  HENT:     Head: Normocephalic and atraumatic.  Eyes:     Conjunctiva/sclera: Conjunctivae normal.  Cardiovascular:     Rate and Rhythm: Normal rate and regular rhythm.     Pulses: Normal pulses.     Heart sounds: Normal heart sounds. No murmur heard.   Pulmonary:     Effort:  Pulmonary effort is normal. No respiratory distress.     Breath sounds: Normal breath sounds.  Abdominal:     General: Abdomen is flat.     Palpations: Abdomen is soft.     Tenderness: There is no abdominal tenderness.  Musculoskeletal:     Cervical back: Neck supple.  Skin:    General: Skin is warm and dry.  Neurological:     General: No focal deficit present.     Mental Status: She is alert and oriented to person, place, and time.     Sensory: No sensory deficit.     Motor: No weakness.     Comments: Mental Status:  Alert, thought content appropriate,  able to give a coherent history. Speech fluent without evidence of aphasia. Able to follow 2 step commands without difficulty.  Cranial Nerves:  II: Peripheral visual fields grossly normal, pupils equal, round, reactive to light III,IV, VI: ptosis not present, extra-ocular motions intact bilaterally  V,VII: smile symmetric, facial light touch sensation equal VIII: hearing grossly normal to voice  X: uvula elevates symmetrically  XI: bilateral shoulder shrug symmetric and strong XII: midline tongue extension without fassiculations Motor:  Normal tone. 5/5 strength of BUE and BLE major muscle groups including strong and equal grip strength and dorsiflexion/plantar flexion Sensory: light touch normal in all extremities. Cerebellar: normal finger-to-nose with bilateral upper extremities, Romberg sign absent Gait:      ED Results / Procedures / Treatments   Labs (all labs ordered are listed, but only abnormal results are displayed) Labs Reviewed  BASIC METABOLIC PANEL - Abnormal; Notable for the following components:      Result Value   Glucose, Bld 117 (*)    Creatinine, Ser 1.02 (*)    All other components within normal limits  CBC    EKG EKG Interpretation  Date/Time:  Sunday February 12 2021 18:24:50 EDT Ventricular Rate:  94 PR Interval:  162 QRS Duration: 80 QT Interval:  347 QTC Calculation: 434 R Axis:   51 Text  Interpretation: Sinus rhythm Right atrial enlargement Borderline T wave abnormalities Confirmed by Zammit, Joseph ([id number]) on 02/12/2021 8:44:38 PM   Radiology No results found.  Procedures Procedures   Medications Ordered in ED Medications - No data to display  ED Course  I have reviewed the triage vital signs and the nursing notes.  Pertinent labs & imaging results that were available during my care of the patient were reviewed by me and considered in my medical decision making (see chart for details).    MDM Rules/Calculators/A&P                          59  year old female who presents with to the ER with complaints of dizziness since around lunchtime.  Worse with standing up.  On arrival, vitals are overall reassuring, her blood pressure is 144/98, no evidence of tachycardia, hypoxia, afebrile.  Her physical exam is overall reassuring, no focal neuro deficits noted.  She does not endorse any dizziness presently.  No chest pain or shortness of breath.  DDx includes dehydration, orthostatic hypotension, stroke, ACS, hypertensive urgency/emergency, vertigo, anemia, arrhythmia  Labs and imaging ordered, reviewed and interpreted by me.  CBC unremarkable, MP with a very mildly elevated creatinine 1.02, likely consistent with dehydration.  No other significant abnormalities noted.  Her EKG normal sinus rhythm.  Orthostatic vitals negative.  Overall, low suspicion for stroke, anemia, arrhythmia, hypertensive urgency/emergency.  Suspect partial dehydration, also could be due to taking her diuretic though she states onset of dizziness was prior to her taking her diuretic.  Stressed taking her medicines as directed, and making sure she follow-up with her PCP.  We discussed return precautions.  Overall she is reassured.  All of her questions have been answered to her satisfaction, she voices understanding and is agreeable to the plan.  Stable for discharge at this time.   Final Clinical  Impression(s) / ED Diagnoses Final diagnoses:  Dizziness    Rx / DC Orders ED Discharge Orders    None       Lyndel Safe 02/12/21 2049    Milton Ferguson, MD 02/14/21 [phone]

## 2021-02-14 ENCOUNTER — Encounter: Payer: Self-pay | Admitting: Family

## 2021-02-14 ENCOUNTER — Ambulatory Visit (INDEPENDENT_AMBULATORY_CARE_PROVIDER_SITE_OTHER): Payer: Medicare Other | Admitting: Family

## 2021-02-14 ENCOUNTER — Other Ambulatory Visit: Payer: Self-pay

## 2021-02-14 VITALS — BP 138/88 | HR 76 | Temp 98.2°F | Ht 70.0 in | Wt 193.2 lb

## 2021-02-14 DIAGNOSIS — R42 Dizziness and giddiness: Secondary | ICD-10-CM

## 2021-02-14 MED ORDER — AZITHROMYCIN 250 MG PO TABS: ORAL_TABLET | ORAL | 0 refills | Status: DC

## 2021-02-14 MED ORDER — METHYLPREDNISOLONE 4 MG PO TBPK: ORAL_TABLET | ORAL | 0 refills | Status: DC

## 2021-02-14 NOTE — Progress Notes (Signed)
[name] is a 60 y.o. female with the following history as recorded in EpicCare:  Patient Active Problem List   Diagnosis Date Noted  . Chest pain at rest 10/06/2020  . Screening for cervical cancer 07/07/2020  . Essential hypertension 03/01/2020  . Occipital neuralgia of right side 01/05/2020  . Atherosclerosis of aorta ([place]) 03/31/2019  . Dyslipidemia, goal LDL below 100 03/31/2019  . Visit for screening mammogram 03/31/2019  . Allergic rhinitis 02/22/2017  . Overweight (BMI 25.0-29.9) 03/22/2016  . Preventative health care 09/09/2014  . Tobacco abuse 11/25/2012  . GAD (generalized anxiety disorder) 04/13/2010  . PREMATURE VENTRICULAR CONTRACTIONS 02/06/2010  . ELECTROCARDIOGRAM, ABNORMAL 02/06/2010  . MIGRAINE HEADACHE 07/24/2007  . GASTROESOPHAGEAL REFLUX DISEASE 07/24/2007    Current Outpatient Medications  Medication Sig Dispense Refill  . ALPRAZolam (XANAX) 0.5 MG tablet TAKE 1 TABLET BY MOUTH AT BEDTIME AS NEEDED FOR ANXIETY (Patient taking differently: Take 0.5 mg by mouth.) 30 tablet 5  . azithromycin (ZITHROMAX) 250 MG tablet 2 tabs po qd x 1 day; 1 tablet per day x 4 days; 6 tablet 0  . fluticasone (FLONASE) 50 MCG/ACT nasal spray Place 2 sprays into both nostrils daily. 48 g 1  . HYDROcodone-acetaminophen (NORCO) 7.5-325 MG tablet Take 1 tablet by mouth every 8 (eight) hours as needed.    . indapamide (LOZOL) 1.25 MG tablet TAKE 1 TABLET(1.25 MG) BY MOUTH DAILY 90 tablet 1  . levocetirizine (XYZAL) 5 MG tablet TAKE 1 TABLET(5 MG) BY MOUTH EVERY EVENING (Patient taking differently: Take 5 mg by mouth every evening.) 90 tablet 1  . LINZESS 145 MCG CAPS capsule Take 145 mcg by mouth daily.    . methylPREDNISolone (MEDROL DOSEPAK) 4 MG TBPK tablet Taper as directed 21 tablet 0  . tiZANidine (ZANAFLEX) 2 MG tablet Take 2 mg by mouth 3 (three) times daily as needed.     No current facility-administered medications for this visit.    Allergies: Livalo [pitavastatin],  Anesthesia s-i-40 [propofol], Augmentin [amoxicillin-pot clavulanate], and Doxycycline  Past Medical History:  Diagnosis Date  . Chest pain   . Depression   . DJD (degenerative joint disease)   . GERD (gastroesophageal reflux disease)   . History of palpitations   . Migraine headache   . PVC's (premature ventricular contractions)   . 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 BACK SURGERY  10/23/12  . CERVICAL FUSION    . CHOLECYSTECTOMY    . LAMINECTOMY    . MRI     every 3 mths /due to menigitis  . TOTAL ABDOMINAL HYSTERECTOMY    . TUBAL LIGATION      Family History  Problem Relation Age of Onset  . Diabetes Mother   . Multiple sclerosis Mother   . Heart disease Father   . Hypertension Sister   . Kidney disease Sister   . Colon cancer Neg Hx     Social History   Tobacco Use  . Smoking status: Former Smoker    Packs/day: 1.00    Years: 36.00    Pack years: 36.00    Types: Cigarettes    Quit date: 09/2019    Years since quitting: 1.3  . Smokeless tobacco: Never Used  Substance Use Topics  . Alcohol use: No    Subjective:  Patient went to the ER on 02/12/21 with episodes of dizziness; was thought to be sinus related- + sinus pressure;  Had not been taking  her blood pressure medication regularly prior; has since started but is concerned about the intensity of the dizziness; Had brain MRI in 02/2020 which did not show any acute changes;    Objective:  Vitals:   02/14/21 1018  BP: 138/88  Pulse: 76  Temp: 98.2 F (36.8 C)  TempSrc: Oral  SpO2: 98%  Weight: 193 lb 3.2 oz (87.6 kg)  Height: 5\' 10"  (1.778 m)    General: Well developed, well nourished, in no acute distress  Skin : Warm and dry.  Head: Normocephalic and atraumatic  Eyes: Sclera and conjunctiva clear; pupils round and reactive to light; extraocular movements intact  Ears: External normal; canals clear; tympanic membranes normal  Oropharynx:  Pink, supple. No suspicious lesions  Neck: Supple without thyromegaly, adenopathy  Lungs: Respirations unlabored; clear to auscultation bilaterally without wheeze, rales, rhonchi  CVS exam: normal rate and regular rhythm.  Neurologic: Alert and oriented; speech intact; face symmetrical; moves all extremities well; CNII-XII intact without focal deficit   Assessment:  1. Dizziness     Plan:  Suspect underlying sinus source; however, due to risk factors and history of smoking, need to consider cardiac source as well; Will treat with Z-pak and Medrol Dose pak which have worked well for sinus issues in the past; reviewed CBC and CMP that were done at ER on Saturday- essentially unremarkable; Will also refer to cardiology to consider holter monitor and schedule for carotid dopplers;  She is to keep her planned follow-up with her PCP for 3 weeks; stressed to take blood pressure medication daily as prescribed;  This visit occurred during the SARS-CoV-2 public health emergency.  Safety protocols were in place, including screening questions prior to the visit, additional usage of staff PPE, and extensive cleaning of exam room while observing appropriate contact time as indicated for disinfecting solutions.     No follow-ups on file.  Orders Placed This Encounter  Procedures  . Ambulatory referral to Cardiology    Referral Priority:   Routine    Referral Type:   Consultation    Referral Reason:   Specialty Services Required    Referred to Provider:   Deboraha Sprang, MD    Requested Specialty:   Cardiology    Number of Visits Requested:   1    Requested Prescriptions   Signed Prescriptions Disp Refills  . azithromycin (ZITHROMAX) 250 MG tablet 6 tablet 0    Sig: 2 tabs po qd x 1 day; 1 tablet per day x 4 days;  . methylPREDNISolone (MEDROL DOSEPAK) 4 MG TBPK tablet 21 tablet 0    Sig: Taper as directed

## 2021-02-23 ENCOUNTER — Other Ambulatory Visit: Payer: Self-pay

## 2021-02-23 ENCOUNTER — Ambulatory Visit (HOSPITAL_COMMUNITY)
Admission: RE | Admit: 2021-02-23 | Discharge: 2021-02-23 | Disposition: A | Discharge status: Home / Self Care | Payer: Medicare Other | Source: Ambulatory Visit | Attending: Cardiology | Admitting: Cardiology

## 2021-02-23 DIAGNOSIS — R42 Dizziness and giddiness: Secondary | ICD-10-CM | POA: Diagnosis not present

## 2021-03-07 ENCOUNTER — Encounter: Payer: Self-pay | Admitting: Internal Medicine

## 2021-03-07 ENCOUNTER — Ambulatory Visit (INDEPENDENT_AMBULATORY_CARE_PROVIDER_SITE_OTHER): Payer: Medicare Other | Admitting: Internal Medicine

## 2021-03-07 ENCOUNTER — Other Ambulatory Visit: Payer: Self-pay

## 2021-03-07 VITALS — BP 148/96 | HR 63 | Temp 98.3°F | Resp 16 | Ht 70.0 in | Wt 200.0 lb

## 2021-03-07 DIAGNOSIS — I10 Essential (primary) hypertension: Secondary | ICD-10-CM

## 2021-03-07 DIAGNOSIS — M5136 Other intervertebral disc degeneration, lumbar region: Secondary | ICD-10-CM | POA: Diagnosis not present

## 2021-03-07 DIAGNOSIS — M51369 Other intervertebral disc degeneration, lumbar region without mention of lumbar back pain or lower extremity pain: Secondary | ICD-10-CM | POA: Insufficient documentation

## 2021-03-07 MED ORDER — INDAPAMIDE 2.5 MG PO TABS: 2.5000 mg | ORAL_TABLET | Freq: Every day | ORAL | 1 refills | Status: DC

## 2021-03-07 NOTE — Patient Instructions (Signed)

## 2021-03-07 NOTE — Progress Notes (Signed)
Subjective:  Patient ID: [name], female    DOB: 1961-04-18  Age: 60 y.o. MRN: [id number]  CC: Hypertension and Back Pain  This visit occurred during the SARS-CoV-2 public health emergency.  Safety protocols were in place, including screening questions prior to the visit, additional usage of staff PPE, and extensive cleaning of exam room while observing appropriate contact time as indicated for disinfecting solutions.    HPI [name] presents for f/up -  She sees a pain specialist for chronic low back pain that radiates into her lower extremity.  She does not think her blood pressure is well controlled.  She said she recently ran out of indapamide.  She complains of weight gain.  She is active and denies any recent episodes of headache, blurred vision, chest pain, shortness of breath, palpitations, edema, or fatigue.  Outpatient Medications Prior to Visit  Medication Sig Dispense Refill  . ALPRAZolam (XANAX) 0.5 MG tablet TAKE 1 TABLET BY MOUTH AT BEDTIME AS NEEDED FOR ANXIETY (Patient taking differently: Take 0.5 mg by mouth.) 30 tablet 5  . fluticasone (FLONASE) 50 MCG/ACT nasal spray Place 2 sprays into both nostrils daily. 48 g 1  . HYDROcodone-acetaminophen (NORCO) 7.5-325 MG tablet Take 1 tablet by mouth every 8 (eight) hours as needed.    Marland Kitchen levocetirizine (XYZAL) 5 MG tablet TAKE 1 TABLET(5 MG) BY MOUTH EVERY EVENING (Patient taking differently: Take 5 mg by mouth every evening.) 90 tablet 1  . LINZESS 145 MCG CAPS capsule Take 145 mcg by mouth daily.    Marland Kitchen tiZANidine (ZANAFLEX) 2 MG tablet Take 2 mg by mouth 3 (three) times daily as needed.    Marland Kitchen azithromycin (ZITHROMAX) 250 MG tablet 2 tabs po qd x 1 day; 1 tablet per day x 4 days; 6 tablet 0  . indapamide (LOZOL) 1.25 MG tablet TAKE 1 TABLET(1.25 MG) BY MOUTH DAILY 90 tablet 1  . methylPREDNISolone (MEDROL DOSEPAK) 4 MG TBPK tablet Taper as directed 21 tablet 0   No facility-administered medications prior to visit.     ROS Review of Systems  Constitutional: Positive for unexpected weight change. Negative for appetite change, diaphoresis and fatigue.  HENT: Negative.   Eyes: Negative.   Respiratory: Negative for cough, chest tightness, shortness of breath and wheezing.   Cardiovascular: Negative for chest pain and palpitations.  Gastrointestinal: Negative for abdominal pain, constipation, diarrhea and vomiting.  Endocrine: Negative.   Genitourinary: Negative.   Musculoskeletal: Positive for back pain. Negative for arthralgias and myalgias.  Skin: Negative.  Negative for color change and pallor.  Neurological: Negative.  Negative for dizziness and weakness.  Hematological: Negative for adenopathy. Does not bruise/bleed easily.  Psychiatric/Behavioral: Negative.     Objective:  BP (!) 148/96 (BP Location: Right Arm, Patient Position: Sitting, Cuff Size: Large)   Pulse 63   Temp 98.3 F (36.8 C) (Oral)   Resp 16   Ht 5\' 10"  (1.778 m)   Wt 200 lb (90.7 kg)   SpO2 98%   BMI 28.70 kg/m   BP Readings from Last 3 Encounters:  03/07/21 (!) 148/96  02/14/21 138/88  02/12/21 (!) 151/88    Wt Readings from Last 3 Encounters:  03/07/21 200 lb (90.7 kg)  02/14/21 193 lb 3.2 oz (87.6 kg)  02/12/21 198 lb (89.8 kg)    Physical Exam Vitals reviewed.  HENT:     Nose: Nose normal.     Mouth/Throat:     Mouth: Mucous membranes are moist.  Eyes:  General: No scleral icterus.    Conjunctiva/sclera: Conjunctivae normal.  Cardiovascular:     Rate and Rhythm: Normal rate and regular rhythm.     Heart sounds: No murmur heard.   Pulmonary:     Effort: Pulmonary effort is normal.     Breath sounds: No stridor. No wheezing, rhonchi or rales.  Abdominal:     General: Abdomen is flat. Bowel sounds are normal. There is no distension.     Palpations: Abdomen is soft.  Musculoskeletal:        General: Normal range of motion.     Cervical back: Neck supple.     Right lower leg: No edema.      Left lower leg: No edema.  Lymphadenopathy:     Cervical: No cervical adenopathy.  Skin:    General: Skin is warm and dry.  Neurological:     General: No focal deficit present.     Mental Status: She is alert.  Psychiatric:        Mood and Affect: Mood normal.        Behavior: Behavior normal.     Lab Results  Component Value Date   WBC 8.8 02/12/2021   HGB 13.4 02/12/2021   HCT 42.1 02/12/2021   PLT 385 02/12/2021   GLUCOSE 117 (H) 02/12/2021   CHOL 196 07/07/2020   TRIG 100 07/07/2020   HDL 57 07/07/2020   LDLCALC 118 (H) 07/07/2020   ALT 16 07/07/2020   AST 18 07/07/2020   NA 137 02/12/2021   K 3.8 02/12/2021   CL 105 02/12/2021   CREATININE 1.02 (H) 02/12/2021   BUN 10 02/12/2021   CO2 24 02/12/2021   TSH 1.67 07/07/2020   HGBA1C 5.4 01/16/2018    VAS US CAROTID  Result Date: 02/23/2021 Carotid Arterial Duplex Study Patient Name:  CHENNEL OLIVOS  Date of Exam:   02/23/2021 Medical Rec #: [id number]         Accession #:    [id number] Date of Birth: 11/27/1960          Patient Gender: F Patient Age:   14Y Exam Location:  [facility] Procedure:      VAS US CAROTID Referring Phys: [place] --------------------------------------------------------------------------------  Indications:  Patient was recently in the ER on 02/12/21 for intense episodes of               dizziness. Thought to be from sinus pressure and was treated with               steroids and antibiotic. Still has some lingering dizziness today               but no other cerebrovascular symptoms. Risk Factors: Hypertension, past history of smoking. Performing Technologist: Mariane Masters RVT  Examination Guidelines: A complete evaluation includes B-mode imaging, spectral Doppler, color Doppler, and power Doppler as needed of all accessible portions of each vessel. Bilateral testing is considered an integral part of a complete examination. Limited examinations for reoccurring indications may be  performed as noted.  Right Carotid Findings: +----------+--------+--------+--------+------------------+--------+           PSV cm/sEDV cm/sStenosisPlaque DescriptionComments +----------+--------+--------+--------+------------------+--------+ CCA Prox  84      22                                         +----------+--------+--------+--------+------------------+--------+ CCA Distal77  24                                         +----------+--------+--------+--------+------------------+--------+ ICA Prox  84      16      Normal                             +----------+--------+--------+--------+------------------+--------+ ICA Mid   72      34                                         +----------+--------+--------+--------+------------------+--------+ ICA Distal76      36                                         +----------+--------+--------+--------+------------------+--------+ ECA       122     30                                         +----------+--------+--------+--------+------------------+--------+ +----------+--------+-------+----------------+-------------------+           PSV cm/sEDV cmsDescribe        Arm Pressure (mmHG) +----------+--------+-------+----------------+-------------------+ [id number]            Multiphasic, [id number]                 +----------+--------+-------+----------------+-------------------+ +---------+--------+--+--------+--+ VertebralPSV cm/s45EDV cm/s14 +---------+--------+--+--------+--+  Left Carotid Findings: +----------+--------+--------+--------+------------------+--------------+           PSV cm/sEDV cm/sStenosisPlaque DescriptionComments       +----------+--------+--------+--------+------------------+--------------+ CCA Prox  84      21                                               +----------+--------+--------+--------+------------------+--------------+ CCA Distal70      24                                                +----------+--------+--------+--------+------------------+--------------+ ICA Prox  92      25      Normal  focal             minimal plaque +----------+--------+--------+--------+------------------+--------------+ ICA Mid   86      33                                               +----------+--------+--------+--------+------------------+--------------+ ICA Distal69      26                                               +----------+--------+--------+--------+------------------+--------------+ ECA       73  19                                               +----------+--------+--------+--------+------------------+--------------+ +----------+--------+--------+----------------+-------------------+           PSV cm/sEDV cm/sDescribe        Arm Pressure (mmHG) +----------+--------+--------+----------------+-------------------+ [id number]              Multiphasic, [id number]                 +----------+--------+--------+----------------+-------------------+ +---------+--------+--+--------+--+---------+ VertebralPSV cm/s73EDV cm/s17Antegrade +---------+--------+--+--------+--+---------+   Summary: Right Carotid: There is no evidence of stenosis in the right ICA. Left Carotid: There is no evidence of stenosis in the left ICA. The extracranial               vessels were near-normal with only minimal wall thickening or               plaque. Vertebrals:  Bilateral vertebral arteries demonstrate antegrade flow. Subclavians: Normal flow hemodynamics were seen in bilateral subclavian              arteries. *See table(s) above for measurements and observations.  Electronically signed by Jenkins Rouge MD on 02/23/2021 at 4:01:06 PM.    Final     Assessment & Plan:   Patia was seen today for hypertension and back pain.  Diagnoses and all orders for this visit:  Essential hypertension- Her blood pressure is not adequately well controlled.  Will restart  indapamide.  She agrees to work on her lifestyle modifications. -     indapamide (LOZOL) 2.5 MG tablet; Take 1 tablet (2.5 mg total) by mouth daily.  DDD (degenerative disc disease), lumbar- This is treated by pain management.   I have discontinued Niala C. Marsolek's indapamide, azithromycin, and methylPREDNISolone. I am also having her start on indapamide. Additionally, I am having her maintain her HYDROcodone-acetaminophen, fluticasone, ALPRAZolam, levocetirizine, Linzess, and tiZANidine.  Meds ordered this encounter  Medications  . indapamide (LOZOL) 2.5 MG tablet    Sig: Take 1 tablet (2.5 mg total) by mouth daily.    Dispense:  90 tablet    Refill:  1     Follow-up: Return in about 3 months (around 06/07/2021).  Scarlette Calico, MD

## 2021-03-22 ENCOUNTER — Encounter: Payer: Self-pay | Admitting: Cardiology

## 2021-03-22 NOTE — Progress Notes (Signed)
Cardiology Office Note  Date: 03/23/2021   ID: [name], DOB 1960/12/24, MRN [id number]  PCP:  Janith Lima, MD  Cardiologist:  Rozann Lesches, MD Electrophysiologist:  None   Chief Complaint  Patient presents with  . Intermittent palpitations    History of Present Illness: [name] is a 60 y.o. female referred for cardiology consultation by Ms. Valere Dross NP for the evaluation of dizziness.  I reviewed the office note from April.  She tells me in retrospect that she feels like her lightheadedness was related to an episode of sinusitis that was successfully treated with antibiotics and steroids.  She has been using Flonase most recently and is back to baseline.  Unrelated to that event, she has had intermittent sense of palpitations, nothing prolonged, and no association of lightheadedness with palpitations.  She has had no syncope.  Cardiac history includes RVOT PVCs, treated with Sectral and then pindolol in the past and followed by Dr. Caryl Comes, last seen in 2012.  She has not required any medications for several years.  She tells that she quit smoking 2 years ago but has pain with heat, has not been walking as much for exercise but she plans to get back to this.  Past Medical History:  Diagnosis Date  . Depression   . DJD (degenerative joint disease)   . GERD (gastroesophageal reflux disease)   . History of palpitations   . Migraine headache   . 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 BACK SURGERY  10/23/12  . CERVICAL FUSION    . CHOLECYSTECTOMY    . LAMINECTOMY    . MRI     every 3 mths /due to menigitis  . TOTAL ABDOMINAL HYSTERECTOMY    . TUBAL LIGATION      Current Outpatient Medications  Medication Sig Dispense Refill  . ALPRAZolam (XANAX) 0.5 MG tablet TAKE 1 TABLET BY MOUTH AT BEDTIME AS NEEDED FOR ANXIETY (Patient taking differently: Take 0.5 mg by mouth.) 30  tablet 5  . fluticasone (FLONASE) 50 MCG/ACT nasal spray Place 2 sprays into both nostrils daily. 48 g 1  . HYDROcodone-acetaminophen (NORCO) 7.5-325 MG tablet Take 1 tablet by mouth every 8 (eight) hours as needed.    . indapamide (LOZOL) 2.5 MG tablet Take 1 tablet (2.5 mg total) by mouth daily. 90 tablet 1  . LINZESS 145 MCG CAPS capsule Take 145 mcg by mouth daily.    Marland Kitchen tiZANidine (ZANAFLEX) 2 MG tablet Take 2 mg by mouth 3 (three) times daily as needed.    Marland Kitchen levocetirizine (XYZAL) 5 MG tablet TAKE 1 TABLET(5 MG) BY MOUTH EVERY EVENING (Patient not taking: Reported on 03/23/2021) 90 tablet 1   No current facility-administered medications for this visit.   Allergies:  Livalo [pitavastatin], Anesthesia s-i-40 [propofol], Augmentin [amoxicillin-pot clavulanate], and Doxycycline   Social History: The patient  reports that she quit smoking about 17 months ago. Her smoking use included cigarettes. She has a 36.00 pack-year smoking history. She has never used smokeless tobacco. She reports that she does not drink alcohol and does not use drugs.   Family History: The patient's family history includes Diabetes in her mother; Heart disease in her father; Hypertension in her sister; Kidney disease in her sister; Multiple sclerosis in her mother.   ROS: No orthopnea or PND.  Physical Exam: VS:  BP 130/90 (BP Location: Left Arm)   Pulse  90   Ht 5' 10.5" (1.791 m)   Wt 192 lb (87.1 kg)   SpO2 97%   BMI 27.16 kg/m , BMI Body mass index is 27.16 kg/m.  Wt Readings from Last 3 Encounters:  03/23/21 192 lb (87.1 kg)  03/07/21 200 lb (90.7 kg)  02/14/21 193 lb 3.2 oz (87.6 kg)    General: Patient appears comfortable at rest. HEENT: Conjunctiva and lids normal, wearing a mask. Neck: Supple, no elevated JVP or carotid bruits, no thyromegaly. Lungs: Clear to auscultation, nonlabored breathing at rest. Cardiac: Regular rate and rhythm, no S3, 1/6 systolic murmur, no pericardial rub. Abdomen: Soft,  nontender, bowel sounds present. Extremities: No pitting edema, distal pulses 2+. Skin: Warm and dry. Musculoskeletal: No kyphosis. Neuropsychiatric: Alert and oriented x3, affect grossly appropriate.  ECG:  An ECG dated 02/12/2021 was personally reviewed today and demonstrated:  Sinus rhythm with nonspecific T wave changes, rule out right atrial enlargement.  Recent Labwork: 07/07/2020: ALT 16; AST 18; TSH 1.67 10/06/2020: Magnesium 2.1; Pro B Natriuretic peptide (BNP) 38.0 02/12/2021: BUN 10; Creatinine, Ser 1.02; Hemoglobin 13.4; Platelets 385; Potassium 3.8; Sodium 137     Component Value Date/Time   CHOL 196 07/07/2020 1130   TRIG 100 07/07/2020 1130   HDL 57 07/07/2020 1130   CHOLHDL 3.4 07/07/2020 1130   VLDL 16.4 03/31/2019 1519   LDLCALC 118 (H) 07/07/2020 1130    Other Studies Reviewed Today:  Carotid Dopplers 02/23/2021: Summary:  Right Carotid: There is no evidence of stenosis in the right ICA.   Left Carotid: There is no evidence of stenosis in the left ICA. The  extracranial        vessels were near-normal with only minimal wall thickening  or        plaque.   Vertebrals: Bilateral vertebral arteries demonstrate antegrade flow.  Subclavians: Normal flow hemodynamics were seen in bilateral subclavian        arteries.   Assessment and Plan:  History of RVOT PVCs, no longer on medical therapy (in the past had been on Sectral and then pindolol).  She reports intermittent palpitations, nothing progressive or prolonged, and no syncope.  We will obtain a 72-hour Zio patch to quantify PVC burden and determine if any re-addition of beta-blocker is necessary.  Obtain follow-up echocardiogram as well.   Medication Adjustments/Labs and Tests Ordered: Current medicines are reviewed at length with the patient today.  Concerns regarding medicines are outlined above.   Tests Ordered: Orders Placed This Encounter  Procedures  . ECHOCARDIOGRAM COMPLETE     Medication Changes: No orders of the defined types were placed in this encounter.   Disposition:  Follow up test results.  Signed, Satira Sark, MD, [facility] 03/23/2021 9:27 AM    [facility]. [address], [place], [place] [address] Phone: [phone]; Fax: [phone]

## 2021-03-23 ENCOUNTER — Other Ambulatory Visit: Payer: Self-pay | Admitting: Cardiology

## 2021-03-23 ENCOUNTER — Other Ambulatory Visit: Payer: Self-pay

## 2021-03-23 ENCOUNTER — Encounter: Payer: Self-pay | Admitting: Cardiology

## 2021-03-23 ENCOUNTER — Ambulatory Visit (INDEPENDENT_AMBULATORY_CARE_PROVIDER_SITE_OTHER): Payer: Medicare Other

## 2021-03-23 ENCOUNTER — Ambulatory Visit (INDEPENDENT_AMBULATORY_CARE_PROVIDER_SITE_OTHER): Payer: Medicare Other | Admitting: Cardiology

## 2021-03-23 VITALS — BP 130/90 | HR 90 | Ht 70.5 in | Wt 192.0 lb

## 2021-03-23 DIAGNOSIS — I4729 Other ventricular tachycardia: Secondary | ICD-10-CM

## 2021-03-23 DIAGNOSIS — I493 Ventricular premature depolarization: Secondary | ICD-10-CM | POA: Diagnosis not present

## 2021-03-23 DIAGNOSIS — R42 Dizziness and giddiness: Secondary | ICD-10-CM

## 2021-03-23 DIAGNOSIS — I472 Ventricular tachycardia: Secondary | ICD-10-CM

## 2021-03-23 NOTE — Patient Instructions (Signed)
Medication Instructions:  Your physician recommends that you continue on your current medications as directed. Please refer to the Current Medication list given to you today.  *If you need a refill on your cardiac medications before your next appointment, please call your pharmacy*   Lab Work: None today   If you have labs (blood work) drawn today and your tests are completely normal, you will receive your results only by: Marland Kitchen MyChart Message (if you have MyChart) OR . A paper copy in the mail If you have any lab test that is abnormal or we need to change your treatment, we will call you to review the results.   Testing/Procedures: Your physician has requested that you have an echocardiogram. Echocardiography is a painless test that uses sound waves to create images of your heart. It provides your doctor with information about the size and shape of your heart and how well your heart's chambers and valves are working. This procedure takes approximately one hour. There are no restrictions for this procedure.  ZIO XT- Long Term Monitor Instructions   Your physician has requested you wear your ZIO patch monitor____3___days.   This is a single patch monitor.  [employer] supplies one patch monitor per enrollment.  Additional stickers are not available.  Do not shower for the first 24 hours.  You may shower after the first 24 hours.   Press button if you feel a symptom. You will hear a small click.  Record Date, Time and Symptom in the Patient Log Book.   When you are ready to remove patch, follow instructions on last 2 pages of Patient Log Book.  Stick patch monitor onto last page of Patient Log Book.   Place Patient Log Book in [place] box.  Use locking tab on box and tape box closed securely.  The [employer] has [employer] on it.  Please place in mailbox as soon as possible.  Your physician should have your test results approximately 7 days after the monitor has been mailed back to  [facility].   Call [place] at [phone] if you have questions regarding your ZIO XT patch monitor.  Call them immediately if you see an orange light blinking on your monitor.   If your monitor falls off in less than 4 days contact our Monitor department at [phone].  If your monitor becomes loose or falls off after 4 days call [employer] at [phone] for suggestions on securing your monitor.    Follow-Up: At [facility], you and your health needs are our priority.  As part of our continuing mission to provide you with exceptional heart care, we have created designated Provider Care Teams.  These Care Teams include your primary Cardiologist (physician) and Advanced Practice Providers (APPs -  Physician Assistants and Nurse Practitioners) who all work together to provide you with the care you need, when you need it.  We recommend signing up for the patient portal called "MyChart".  Sign up information is provided on this After Visit Summary.  MyChart is used to connect with patients for Virtual Visits (Telemedicine).  Patients are able to view lab/test results, encounter notes, upcoming appointments, etc.  Non-urgent messages can be sent to your provider as well.   To learn more about what you can do with MyChart, go to [email or web address].    Your next appointment:  To be determined after echo and monitor results are back. We will call you with those results.     Thank  you for choosing [facility] !

## 2021-04-07 ENCOUNTER — Ambulatory Visit (HOSPITAL_COMMUNITY): Admission: RE | Admit: 2021-04-07 | Payer: Medicare Other | Source: Ambulatory Visit

## 2021-04-25 ENCOUNTER — Other Ambulatory Visit: Payer: Self-pay

## 2021-04-25 ENCOUNTER — Ambulatory Visit (HOSPITAL_COMMUNITY)
Admission: RE | Admit: 2021-04-25 | Discharge: 2021-04-25 | Disposition: A | Discharge status: Home / Self Care | Payer: Medicare Other | Source: Ambulatory Visit | Attending: Cardiology | Admitting: Cardiology

## 2021-04-25 DIAGNOSIS — I493 Ventricular premature depolarization: Secondary | ICD-10-CM | POA: Diagnosis not present

## 2021-04-25 DIAGNOSIS — Z87891 Personal history of nicotine dependence: Secondary | ICD-10-CM | POA: Insufficient documentation

## 2021-04-25 DIAGNOSIS — I10 Essential (primary) hypertension: Secondary | ICD-10-CM | POA: Diagnosis not present

## 2021-04-25 DIAGNOSIS — E785 Hyperlipidemia, unspecified: Secondary | ICD-10-CM | POA: Insufficient documentation

## 2021-04-25 LAB — ECHOCARDIOGRAM COMPLETE
AR max vel: 2.17 cm2
AV Area VTI: 2.08 cm2
AV Area mean vel: 2.05 cm2
AV Mean grad: 3.6 mmHg
AV Peak grad: 7.1 mmHg
Ao pk vel: 1.34 m/s
Area-P 1/2: 4.15 cm2
S' Lateral: 2.7 cm

## 2021-04-25 NOTE — Progress Notes (Signed)
*  PRELIMINARY RESULTS* Echocardiogram 2D Echocardiogram has been performed.  [name] 04/25/2021, 9:17 AM

## 2021-04-26 ENCOUNTER — Telehealth: Payer: Self-pay | Admitting: Internal Medicine

## 2021-04-26 ENCOUNTER — Telehealth: Payer: Self-pay

## 2021-04-26 NOTE — Telephone Encounter (Signed)
Patient notified and verbalized understanding. Pt put into recalls for 1 yr f/u visit.

## 2021-04-26 NOTE — Chronic Care Management (AMB) (Signed)
  Chronic Care Management   Outreach Note  04/26/2021 Name: [name] MRN: [id number] DOB: 1961/09/22  Referred by: Janith Lima, MD Reason for referral : No chief complaint on file.   Third unsuccessful telephone outreach was attempted today. The patient was referred to the pharmacist for assistance with care management and care coordination.   Follow Up Plan:   Lauretta Grill Upstream Scheduler

## 2021-04-26 NOTE — Telephone Encounter (Signed)
-----   Message from Satira Sark, MD sent at 04/25/2021  6:35 PM EDT ----- Results reviewed.  Cardiac function remains normal with LVEF 60 to 65% and also normal RV contraction.  Please schedule a 1 year follow-up visit.

## 2021-05-03 ENCOUNTER — Telehealth: Payer: Self-pay | Admitting: Internal Medicine

## 2021-05-03 NOTE — Progress Notes (Signed)
  Chronic Care Management   Outreach Note  05/03/2021 Name: ANGIE PIERCEY MRN: [id number] DOB: 02-20-1961  Referred by: Janith Lima, MD Reason for referral : No chief complaint on file.   Third unsuccessful telephone outreach was attempted today. The patient was referred to the pharmacist for assistance with care management and care coordination.   Follow Up Plan:   Lauretta Grill Upstream Scheduler

## 2021-05-04 ENCOUNTER — Telehealth: Payer: Self-pay | Admitting: Internal Medicine

## 2021-05-04 NOTE — Chronic Care Management (AMB) (Signed)
  Chronic Care Management   Note  05/04/2021 Name: [name] MRN: [id number] DOB: 22-Jan-1961  [name] is a 60 y.o. year old female who is a primary care patient of Janith Lima, MD. I reached out to [name] by phone today in response to a referral sent by Ms. Horace PCP, Janith Lima, MD.   Ms. Real was given information about Chronic Care Management services today including:  CCM service includes personalized support from designated clinical staff supervised by her physician, including individualized plan of care and coordination with other care providers 24/7 contact phone numbers for assistance for urgent and routine care needs. Service will only be billed when office clinical staff spend 20 minutes or more in a month to coordinate care. Only one practitioner may furnish and bill the service in a calendar month. The patient may stop CCM services at any time (effective at the end of the month) by phone call to the office staff.   Patient did not agree to enrollment in care management services and does not wish to consider at this time.  Follow up plan:   Lauretta Grill Upstream Scheduler

## 2021-05-30 ENCOUNTER — Telehealth: Payer: Self-pay

## 2021-05-30 NOTE — Chronic Care Management (AMB) (Signed)
    Chronic Care Management Pharmacy Assistant   Name: [name]  MRN: [id number] DOB: Oct 28, 1961  [name] is an 60 y.o. year old female who presents for his initial CCM visit with the clinical pharmacist.  Recent office visits:  03/07/21-Thomas Evalina Field, MD (PCP) Seen for Hypertension and back pain. Restart on  indapamide (LOZOL) 2.5 MG tablet; Take 1 tablet (2.5 mg total) by mouth daily. Follow up in 3 months. 02/14/21-Laura Scheryl Darter, FNP. Seen for Dizziness. Start on Z-pak and Medrol Dose pak which have worked well for sinus issues in the past. Ambulatory referral to cardiology. Recent consult visits:  03/23/21-Samuel G. McDowell, MD (Cardiology) Seen for Intermittent palpitations. Echocardiogram completed.  Hospital visits:  Medication Reconciliation was completed by comparing discharge summary, patient's EMR and Pharmacy list, and upon discussion with patient.  Admitted to the hospital on 02/12/21 due to Dizziness. Discharge date was 02/12/21. Discharged from [facility]?Medications Started at [facility] Discharge:?? -started none noted  Medication Changes at Hospital Discharge: -Changed none noted  Medications Discontinued at Hospital Discharge: -Stopped none noted  Medications that remain the same after Hospital Discharge:??  -All other medications will remain the same.    Medications: Outpatient Encounter Medications as of 05/30/2021  Medication Sig   ALPRAZolam (XANAX) 0.5 MG tablet TAKE 1 TABLET BY MOUTH AT BEDTIME AS NEEDED FOR ANXIETY (Patient taking differently: Take 0.5 mg by mouth.)   fluticasone (FLONASE) 50 MCG/ACT nasal spray Place 2 sprays into both nostrils daily.   HYDROcodone-acetaminophen (NORCO) 7.5-325 MG tablet Take 1 tablet by mouth every 8 (eight) hours as needed.   indapamide (LOZOL) 2.5 MG tablet Take 1 tablet (2.5 mg total) by mouth daily.   levocetirizine (XYZAL) 5 MG tablet TAKE 1 TABLET(5 MG) BY MOUTH EVERY  EVENING (Patient not taking: Reported on 03/23/2021)   LINZESS 145 MCG CAPS capsule Take 145 mcg by mouth daily.   tiZANidine (ZANAFLEX) 2 MG tablet Take 2 mg by mouth 3 (three) times daily as needed.   No facility-administered encounter medications on file as of 05/30/2021.   ALPRAZolam (XANAX) 0.5 MG tablet Last filled:05/09/21 30 DS Fluticasone (FLONASE) 50 MCG/ACT nasal spray Last filled:05/12/21 90 DS HYDROcodone-acetaminophen (NORCO) 7.5-325 MG tablet Last filled:05/09/21 30 DS Indapamide (LOZOL) 2.5 MG tablet Last filled:04/18/21 90 DS Levocetirizine (XYZAL) 5 MG tablet Last filled:02/11/21 90 DS LINZESS 145 MCG CAPS capsule Last filled:04/10/21 30 DS TiZANidine (ZANAFLEX) 2 MG tablet Last filed:05/07/21 30 DS   Star Rating Drugs: None noted  Corrie Mckusick, [place]

## 2021-06-12 ENCOUNTER — Other Ambulatory Visit: Payer: Self-pay

## 2021-06-12 ENCOUNTER — Other Ambulatory Visit: Payer: Self-pay | Admitting: Internal Medicine

## 2021-06-12 ENCOUNTER — Ambulatory Visit (INDEPENDENT_AMBULATORY_CARE_PROVIDER_SITE_OTHER): Payer: Medicare Other

## 2021-06-12 DIAGNOSIS — I10 Essential (primary) hypertension: Secondary | ICD-10-CM

## 2021-06-12 DIAGNOSIS — E785 Hyperlipidemia, unspecified: Secondary | ICD-10-CM

## 2021-06-12 DIAGNOSIS — M5136 Other intervertebral disc degeneration, lumbar region: Secondary | ICD-10-CM

## 2021-06-12 DIAGNOSIS — I700 Atherosclerosis of aorta: Secondary | ICD-10-CM

## 2021-06-12 DIAGNOSIS — F411 Generalized anxiety disorder: Secondary | ICD-10-CM

## 2021-06-12 DIAGNOSIS — J309 Allergic rhinitis, unspecified: Secondary | ICD-10-CM

## 2021-06-12 MED ORDER — PRAVASTATIN SODIUM 10 MG PO TABS: 10.0000 mg | ORAL_TABLET | Freq: Every day | ORAL | 1 refills | Status: DC

## 2021-06-12 NOTE — Progress Notes (Signed)
Chronic Care Management Pharmacy Note  06/12/2021 Name:  [name] MRN:  [id number] DOB:  1961/02/10  Summary: - Patient reports that she has been checking blood pressure since being started on indapamide, reports that blood pressures today were in range and <140/90 could not recall previous blood pressures   -Notes that her anxiety is controlled with alprazolam, only takes 1/2 tablet once weekly if that - Follows with Dr. Manon Hilding for pain, controlled through hydrocodone and tizanidine use - LDL slightly elevated from goal, patient reports that she does eat a fair amount of beef in her diet, has not been as active as she previously had been walking with husband daily  Recommendations/Changes made from today's visit: - Patient to start pravastatin 10mg  daily to help lower cholesterol to goal (<100) and to start lowering intake of high cholesterol foods  -Patient to check blood pressure 2-3 times weekly, will reach out if blood pressure is averaging >140/90  Subjective: [name] is an 60 y.o. year old female who is a primary patient of 67, MD.  The CCM team was consulted for assistance with disease management and care coordination needs.    Engaged with patient by telephone for initial visit in response to provider referral for pharmacy case management and/or care coordination services.   Consent to Services:  The patient was given the following information about Chronic Care Management services today, agreed to services, and gave verbal consent: 1. CCM service includes personalized support from designated clinical staff supervised by the primary care provider, including individualized plan of care and coordination with other care providers 2. 24/7 contact phone numbers for assistance for urgent and routine care needs. 3. Service will only be billed when office clinical staff spend 20 minutes or more in a month to coordinate care. 4. Only one practitioner may furnish and  bill the service in a calendar month. 5.The patient may stop CCM services at any time (effective at the end of the month) by phone call to the office staff. 6. The patient will be responsible for cost sharing (co-pay) of up to 20% of the service fee (after annual deductible is met). Patient agreed to services and consent obtained.  Patient Care Team: Etta Grandchild, MD as PCP - General (Internal Medicine) Etta Grandchild, MD as PCP - Cardiology (Cardiology) Jonelle Sidle Erlene Quan, [facility] as Pharmacist (Pharmacist)  03/07/21-Thomas 05/07/21, MD (PCP) Seen for Hypertension and back pain. Restart on  indapamide (LOZOL) 2.5 MG tablet; Take 1 tablet (2.5 mg total) by mouth daily. Follow up in 3 months. 02/14/21-Laura 02/16/21, FNP. Seen for Dizziness. Start on Z-pak and Medrol Dose pak which have worked well for sinus issues in the past. Ambulatory referral to cardiology.  Recent consult visits:  03/23/21-Samuel G. McDowell, MD (Cardiology) Seen for Intermittent palpitations. Echocardiogram completed - LVEF 60-65% normal RV contraction - f/u in 1 year - no changes to medications    Hospital visits:  Medication Reconciliation was completed by comparing discharge summary, patient's EMR and Pharmacy list, and upon discussion with patient.   Admitted to the hospital on 02/12/21 due to Dizziness. Discharge date was 02/12/21. Discharged from [facility].     New?Medications Started at [facility] Discharge:?? -started none noted   Medication Changes at Hospital Discharge: -Changed none noted   Medications Discontinued at Hospital Discharge: -Stopped none noted   Medications that remain the same after Hospital Discharge:??  -All other medications will remain the same.  Objective:  Lab Results  Component Value Date   CREATININE 1.02 (H) 02/12/2021   BUN 10 02/12/2021   GFR 78.25 10/06/2020   GFRNONAA >60 02/12/2021   GFRAA 100 07/07/2020   NA 137 02/12/2021   K 3.8 02/12/2021    CALCIUM 9.4 02/12/2021   CO2 24 02/12/2021   GLUCOSE 117 (H) 02/12/2021    Lab Results  Component Value Date/Time   HGBA1C 5.4 01/16/2018 11:58 AM   GFR 78.25 10/06/2020 09:30 AM   GFR 103.67 03/01/2020 02:34 PM    Last diabetic Eye exam:  No results found for: HMDIABEYEEXA  Last diabetic Foot exam:  No results found for: HMDIABFOOTEX   Lab Results  Component Value Date   CHOL 196 07/07/2020   HDL 57 07/07/2020   LDLCALC 118 (H) 07/07/2020   TRIG 100 07/07/2020   CHOLHDL 3.4 07/07/2020    Hepatic Function Latest Ref Rng & Units 07/07/2020 03/31/2019 12/16/2017  Total Protein 6.1 - 8.1 g/dL 7.9 7.9 8.5(H)  Albumin 3.5 - 5.2 g/dL - 4.1 4.2  AST 10 - 35 U/L $Remo'18 15 14  'TaOUX$ ALT 6 - 29 U/L $Remo'16 16 17  'MvTcS$ Alk Phosphatase 39 - 117 U/L - 76 86  Total Bilirubin 0.2 - 1.2 mg/dL 1.3(H) 0.9 0.7  Bilirubin, Direct 0.0 - 0.2 mg/dL 0.2 - -    Lab Results  Component Value Date/Time   TSH 1.67 07/07/2020 11:30 AM   TSH 3.95 03/01/2020 02:34 PM    CBC Latest Ref Rng & Units 02/12/2021 10/06/2020 03/01/2020  WBC 4.0 - 10.5 K/uL 8.8 6.4 7.2  Hemoglobin 12.0 - 15.0 g/dL 13.4 13.4 13.6  Hematocrit 36.0 - 46.0 % 42.1 40.5 42.1  Platelets 150 - 400 K/uL 385 367.0 362.0    Lab Results  Component Value Date/Time   VD25OH 13.19 (L) 03/22/2016 02:50 PM    Clinical ASCVD: No  The 10-year ASCVD risk score Mikey Bussing DC Jr., et al., 2013) is: 6.8%   Values used to calculate the score:     Age: 48 years     Sex: Female     Is Non-Hispanic African American: Yes     Diabetic: No     Tobacco smoker: No     Systolic Blood Pressure: 751 mmHg     Is BP treated: Yes     HDL Cholesterol: 57 mg/dL     Total Cholesterol: 196 mg/dL    Depression screen [facility] 2/9 02/14/2021 12/15/2020 10/06/2020  Decreased Interest 0 0 0  Down, Depressed, Hopeless 0 0 0  PHQ - 2 Score 0 0 0  Some recent data might be hidden    Social History   Tobacco Use  Smoking Status Former   Packs/day: 1.00   Years: 36.00   Pack years:  36.00   Types: Cigarettes   Quit date: 09/2019   Years since quitting: 1.7  Smokeless Tobacco Never   BP Readings from Last 3 Encounters:  03/23/21 130/90  03/07/21 (!) 148/96  02/14/21 138/88   Pulse Readings from Last 3 Encounters:  03/23/21 90  03/07/21 63  02/14/21 76   Wt Readings from Last 3 Encounters:  03/23/21 192 lb (87.1 kg)  03/07/21 200 lb (90.7 kg)  02/14/21 193 lb 3.2 oz (87.6 kg)   BMI Readings from Last 3 Encounters:  03/23/21 27.16 kg/m  03/07/21 28.70 kg/m  02/14/21 27.72 kg/m    Assessment/Interventions: Review of patient past medical history, allergies, medications, health status, including review of consultants reports, laboratory  and other test data, was performed as part of comprehensive evaluation and provision of chronic care management services.   SDOH:  (Social Determinants of Health) assessments and interventions performed: Yes  SDOH Screenings   Alcohol Screen: Low Risk    Last Alcohol Screening Score (AUDIT): 0  Depression (PHQ2-9): Low Risk    PHQ-2 Score: 0  Financial Resource Strain: Low Risk    Difficulty of Paying Living Expenses: Not hard at all  Food Insecurity: No Food Insecurity   Worried About Charity fundraiser in the Last Year: Never true   Ran Out of Food in the Last Year: Never true  Housing: Low Risk    Last Housing Risk Score: 0  Physical Activity: Inactive   Days of Exercise per Week: 0 days   Minutes of Exercise per Session: 0 min  Social Connections: Engineer, building services of Communication with Friends and Family: More than three times a week   Frequency of Social Gatherings with Friends and Family: Once a week   Attends Religious Services: More than 4 times per year   Active Member of [employer] or Organizations: Yes   Attends Music therapist: More than 4 times per year   Marital Status: Married  Stress: No Stress Concern Present   Feeling of Stress : Not at all  Tobacco Use: Medium Risk    Smoking Tobacco Use: Former   Smokeless Tobacco Use: Never  Transportation Needs: No Data processing manager (Medical): No   Lack of Transportation (Non-Medical): No    CCM Care Plan  Allergies  Allergen Reactions   Livalo [Pitavastatin] Other (See Comments)    Muscle aches   Anesthesia S-I-40 [Propofol]    Augmentin [Amoxicillin-Pot Clavulanate] Nausea And Vomiting   Doxycycline     Nausea    Medications Reviewed Today     Reviewed by Tomasa Blase, [facility] (Pharmacist) on 06/12/21 at 1438  Med List Status: <None>   Medication Order Taking? Sig Documenting Provider Last Dose Status Informant  ALPRAZolam (XANAX) 0.5 MG tablet [id number] Yes TAKE 1 TABLET BY MOUTH AT BEDTIME AS NEEDED FOR ANXIETY  Patient taking differently: Take 0.25 mg by mouth daily as needed.   Janith Lima, MD Taking Active Self  fluticasone [facility]) 50 MCG/ACT nasal spray [id number] Yes Place 2 sprays into both nostrils daily. Janith Lima, MD Taking Active Self  HYDROcodone-acetaminophen [facility]) 7.5-325 MG tablet [id number] Yes Take 1 tablet by mouth every 8 (eight) hours as needed. [provider] Taking Active Self  indapamide (LOZOL) 2.5 MG tablet [id number] Yes Take 1 tablet (2.5 mg total) by mouth daily. Janith Lima, MD Taking Active   levocetirizine (XYZAL) 5 MG tablet [id number] Yes TAKE 1 TABLET(5 MG) BY MOUTH EVERY Georgette Dover, MD Taking Active   LINZESS 145 MCG CAPS capsule [id number] Yes Take 145 mcg by mouth daily as needed. [provider] Taking Active Self  tiZANidine (ZANAFLEX) 2 MG tablet [id number] Yes Take 2 mg by mouth 3 (three) times daily as needed. [provider] Taking Active Self            Patient Active Problem List   Diagnosis Date Noted   DDD (degenerative disc disease), lumbar 03/07/2021   Screening for cervical cancer 07/07/2020   Essential hypertension 03/01/2020   Occipital neuralgia of right side  01/05/2020   Atherosclerosis of aorta ([place]) 03/31/2019   Dyslipidemia, goal LDL below 100 03/31/2019   Visit  for screening mammogram 03/31/2019   Allergic rhinitis 02/22/2017   Overweight (BMI 25.0-29.9) 03/22/2016   Preventative health care 09/09/2014   GAD (generalized anxiety disorder) 04/13/2010   PREMATURE VENTRICULAR CONTRACTIONS 02/06/2010   ELECTROCARDIOGRAM, ABNORMAL 02/06/2010   MIGRAINE HEADACHE 07/24/2007   GASTROESOPHAGEAL REFLUX DISEASE 07/24/2007    Immunization History  Administered Date(s) Administered   Influenza Split 11/24/2012   Influenza,inj,Quad PF,6+ Mos 08/20/2014, 12/02/2015, 08/20/2017, 06/22/2019, 07/07/2020   PFIZER(Purple Top)SARS-COV-2 Vaccination 01/02/2020, 02/02/2020, 10/10/2020   Tdap 09/09/2014    Conditions to be addressed/monitored:  Hypertension, Hyperlipidemia, Anxiety, Allergic Rhinitis, and Chronic pain  Care Plan : CCM Care Plan  Updates made by Tomasa Blase, [place] since 06/12/2021 12:00 AM     Problem: HTN, HLD, Chronic Pain, allergic rhinitis, Anxiety   Priority: High  Onset Date: 06/12/2021     Long-Range Goal: Disease Managemenet   Start Date: 06/12/2021  Expected End Date: 12/13/2021  This Visit's Progress: On track  Priority: High  Note:   Current Barriers:  Unable to independently monitor therapeutic efficacy Unable to achieve control of LDL   Pharmacist Clinical Goal(s):  Patient will achieve adherence to monitoring guidelines and medication adherence to achieve therapeutic efficacy achieve control of LDL as evidenced by next lipid panel maintain control of BP, Pain, Anxiety, and Allergies as evidenced by blood pressure log, pain/ stress levels, and allergy attacks  through collaboration with PharmD and provider.   Interventions: 1:1 collaboration with Janith Lima, MD regarding development and update of comprehensive plan of care as evidenced by provider attestation and co-signature Inter-disciplinary care team  collaboration (see longitudinal plan of care) Comprehensive medication review performed; medication list updated in electronic medical record  Hypertension (BP goal <140/90) -Controlled -Current treatment: Indapamide 2.5mg  - 1 tablet daily -Medications previously tried: pindolol  -Current home readings: 122/85 - 76  BP Readings from Last 3 Encounters:  03/23/21 130/90  03/07/21 (!) 148/96  02/14/21 138/88  -Current dietary habits: reports that she does use salt on certain foods, tries her best to follow sodium reduced diet -Current exercise habits: previously had been walking daily with husband, has not been as active recently  -Denies hypotensive/hypertensive symptoms -Educated on BP goals and benefits of medications for prevention of heart attack, stroke and kidney damage; Daily salt intake goal < 2300 mg; Exercise goal of 150 minutes per week; Importance of home blood pressure monitoring; Proper BP monitoring technique; Symptoms of hypotension and importance of maintaining adequate hydration; -Counseled to monitor BP at home 2-3 times weekly, document, and provide log at future appointments -Counseled on diet and exercise extensively Recommended to continue current medication  Hyperlipidemia: (LDL goal < 100) -Not ideally controlled Lab Results  Component Value Date   LDLCALC 118 (H) 07/07/2020  -Current treatment: N/a -Medications previously tried: atorvastatin (10mg ), rosuvastatin (10mg ), pitavastatin   -Current dietary patterns: reports that she has been eating more vegetables over summer, does not eat pork, but does eat beef fairly often  -Current exercise habits: reports she previously had been walking daily with husband, but has not been doing this recently -Educated on Cholesterol goals;  Benefits of statin for ASCVD risk reduction; Importance of limiting foods high in cholesterol; Exercise goal of 150 minutes per week; Strategies to manage statin-induced  myalgias; -Recommended for patient to start pravastatin 10mg  daily   Anxiety (Goal: prevention/ control of anxiety attacks) -Controlled -Current treatment: Alprazolam 0.5mg  - 1 tablet daily as needed - might take 1 tablet weekly  -Medications previously tried/failed: hydroxyzine -  GAD7: 3 -Educated on Benefits of medication for symptom control Benefits of cognitive-behavioral therapy with or without medication -Recommended to continue current medication  Allergic Rhinitis (Goal: Prevention/ treatment of allergies) -Controlled -Current treatment  Levocetirizine 5mg  - 1 tablet daily  - not currently taking  Fluticasone 48mcg/act - 2 sprays into each nostril daily  -Medications previously tried: n/a  -Recommended to continue current medication  Chronic Pain -Degenerative Disc Disease (Goal: Pain control) -Controlled -Follows with Dr. Greta Doom for pain management  -Current treatment  Hydrocodone-Acetaminophen 7.5/325mg  - 1/2- 1 tablet every 8 hours as needed  Tizanidine 2mg  - 1 tablet 3 times daily as needed  Linzess 126mcg - 1 capsule daily (for opioid induced constipation) -Medications previously tried: cyclobenzaprine, gabapentin, ibuprofen  -Counseled on risks of concurrent opioid, benzodiazepine, and muscle relaxer use, patient voiced understanding, uses medications sparingly and only as needed, does not take medications together    Patient Goals/Self-Care Activities Patient will:  - take medications as prescribed check blood pressure 2-3 times weekly, document, and provide at future appointments Start pravastatin 10mg  daily   Follow Up Plan: Telephone follow up appointment with care management team member scheduled for: 3 months  The patient has been provided with contact information for the care management team and has been advised to call with any health related questions or concerns.         Medication Assistance: None required.  Patient affirms current coverage meets  needs.  Patient's preferred pharmacy is:  [facility] [id number] - [place], [place] AT [place] [address] [place] [address] Phone: [phone] Fax: [phone]   Uses pill box? Yes Pt endorses 100% compliance  Care Plan and Follow Up Patient Decision:  Patient agrees to Care Plan and Follow-up.  Plan: Telephone follow up appointment with care management team member scheduled for:  3 months and The patient has been provided with contact information for the care management team and has been advised to call with any health related questions or concerns.   Tomasa Blase, PharmD Clinical Pharmacist, [place]

## 2021-06-12 NOTE — Patient Instructions (Signed)
Visit Information   PATIENT GOALS:   Goals Addressed             This Visit's Progress    Track and Manage My Blood Pressure-Hypertension       Timeframe:  Long-Range Goal Priority:  High Start Date: 06/12/2021                            Expected End Date:  12/13/2021                     Follow Up Date 09/12/2021   - check blood pressure 3 times per week - choose a place to take my blood pressure (home, clinic or office, retail store) - write blood pressure results in a log or diary    Why is this important?   You won't feel high blood pressure, but it can still hurt your blood vessels.  High blood pressure can cause heart or kidney problems. It can also cause a stroke.  Making lifestyle changes like losing a little weight or eating less salt will help.  Checking your blood pressure at home and at different times of the day can help to control blood pressure.  If the doctor prescribes medicine remember to take it the way the doctor ordered.  Call the office if you cannot afford the medicine or if there are questions about it.            Consent to CCM Services: Ms. Shrader was given information about Chronic Care Management services including:  CCM service includes personalized support from designated clinical staff supervised by her physician, including individualized plan of care and coordination with other care providers 24/7 contact phone numbers for assistance for urgent and routine care needs. Service will only be billed when office clinical staff spend 20 minutes or more in a month to coordinate care. Only one practitioner may furnish and bill the service in a calendar month. The patient may stop CCM services at any time (effective at the end of the month) by phone call to the office staff. The patient will be responsible for cost sharing (co-pay) of up to 20% of the service fee (after annual deductible is met).  Patient agreed to services and verbal consent  obtained.   Patient verbalizes understanding of instructions provided today and agrees to view in [place].   Telephone follow up appointment with care management team member scheduled for: 3 months The patient has been provided with contact information for the care management team and has been advised to call with any health related questions or concerns.   Tomasa Blase, PharmD Clinical Pharmacist, [place]    CLINICAL CARE PLAN: Patient Care Plan: CCM Care Plan     Problem Identified: HTN, HLD, Chronic Pain, allergic rhinitis, Anxiety   Priority: High  Onset Date: 06/12/2021     Long-Range Goal: Disease Managemenet   Start Date: 06/12/2021  Expected End Date: 12/13/2021  This Visit's Progress: On track  Priority: High  Note:   Current Barriers:  Unable to independently monitor therapeutic efficacy Unable to achieve control of LDL   Pharmacist Clinical Goal(s):  Patient will achieve adherence to monitoring guidelines and medication adherence to achieve therapeutic efficacy achieve control of LDL as evidenced by next lipid panel maintain control of BP, Pain, Anxiety, and Allergies as evidenced by blood pressure log, pain/ stress levels, and allergy attacks  through collaboration with PharmD and  provider.   Interventions: 1:1 collaboration with Janith Lima, MD regarding development and update of comprehensive plan of care as evidenced by provider attestation and co-signature Inter-disciplinary care team collaboration (see longitudinal plan of care) Comprehensive medication review performed; medication list updated in electronic medical record  Hypertension (BP goal <140/90) -Controlled -Current treatment: Indapamide 2.20m - 1 tablet daily -Medications previously tried: pindolol  -Current home readings: 122/85 - 76  BP Readings from Last 3 Encounters:  03/23/21 130/90  03/07/21 (!) 148/96  02/14/21 138/88  -Current dietary habits: reports that she does  use salt on certain foods, tries her best to follow sodium reduced diet -Current exercise habits: previously had been walking daily with husband, has not been as active recently  -Denies hypotensive/hypertensive symptoms -Educated on BP goals and benefits of medications for prevention of heart attack, stroke and kidney damage; Daily salt intake goal < 2300 mg; Exercise goal of 150 minutes per week; Importance of home blood pressure monitoring; Proper BP monitoring technique; Symptoms of hypotension and importance of maintaining adequate hydration; -Counseled to monitor BP at home 2-3 times weekly, document, and provide log at future appointments -Counseled on diet and exercise extensively Recommended to continue current medication  Hyperlipidemia: (LDL goal < 100) -Not ideally controlled Lab Results  Component Value Date   LDLCALC 118 (H) 07/07/2020  -Current treatment: N/a -Medications previously tried: atorvastatin (115m, rosuvastatin (109m pitavastatin   -Current dietary patterns: reports that she has been eating more vegetables over summer, does not eat pork, but does eat beef fairly often  -Current exercise habits: reports she previously had been walking daily with husband, but has not been doing this recently -Educated on Cholesterol goals;  Benefits of statin for ASCVD risk reduction; Importance of limiting foods high in cholesterol; Exercise goal of 150 minutes per week; Strategies to manage statin-induced myalgias; -Recommended for patient to start pravastatin 80m100mily   Anxiety (Goal: prevention/ control of anxiety attacks) -Controlled -Current treatment: Alprazolam 0.5mg 69m tablet daily as needed - might take 1 tablet weekly  -Medications previously tried/failed: hydroxyzine -GAD7: 3 -Educated on Benefits of medication for symptom control Benefits of cognitive-behavioral therapy with or without medication -Recommended to continue current medication  Allergic  Rhinitis (Goal: Prevention/ treatment of allergies) -Controlled -Current treatment  Levocetirizine 5mg -27mtablet daily  - not currently taking  Fluticasone 50mcg/33m- 2 sprays into each nostril daily  -Medications previously tried: n/a  -Recommended to continue current medication  Chronic Pain -Degenerative Disc Disease (Goal: Pain control) -Controlled -Follows with Dr. Bodea fGreta Doomin management  -Current treatment  Hydrocodone-Acetaminophen 7.5/325mg - 1/2- 1 tablet every 8 hours as needed  Tizanidine 2mg - 176mblet 3 times daily as needed  Linzess 145mcg - 14mpsule daily (for opioid induced constipation) -Medications previously tried: cyclobenzaprine, gabapentin, ibuprofen  -Counseled on risks of concurrent opioid, benzodiazepine, and muscle relaxer use, patient voiced understanding, uses medications sparingly and only as needed, does not take medications together    Patient Goals/Self-Care Activities Patient will:  - take medications as prescribed check blood pressure 2-3 times weekly, document, and provide at future appointments Start pravastatin 80mg dail7mFollow Up Plan: Telephone follow up appointment with care management team member scheduled for: 3 months  The patient has been provided with contact information for the care management team and has been advised to call with any health related questions or concerns.

## 2021-06-26 ENCOUNTER — Other Ambulatory Visit: Payer: Self-pay | Admitting: Internal Medicine

## 2021-06-26 DIAGNOSIS — I10 Essential (primary) hypertension: Secondary | ICD-10-CM

## 2021-06-27 ENCOUNTER — Other Ambulatory Visit: Payer: Self-pay | Admitting: Internal Medicine

## 2021-06-27 DIAGNOSIS — I10 Essential (primary) hypertension: Secondary | ICD-10-CM

## 2021-07-31 ENCOUNTER — Encounter: Payer: Self-pay | Admitting: Emergency Medicine

## 2021-07-31 ENCOUNTER — Telehealth (INDEPENDENT_AMBULATORY_CARE_PROVIDER_SITE_OTHER): Payer: Medicare Other | Admitting: Emergency Medicine

## 2021-07-31 DIAGNOSIS — R051 Acute cough: Secondary | ICD-10-CM | POA: Diagnosis not present

## 2021-07-31 DIAGNOSIS — J0100 Acute maxillary sinusitis, unspecified: Secondary | ICD-10-CM | POA: Diagnosis not present

## 2021-07-31 DIAGNOSIS — R0981 Nasal congestion: Secondary | ICD-10-CM

## 2021-07-31 MED ORDER — AZITHROMYCIN 250 MG PO TABS: ORAL_TABLET | ORAL | 0 refills | Status: DC

## 2021-07-31 MED ORDER — METHYLPREDNISOLONE 4 MG PO TBPK: ORAL_TABLET | ORAL | 1 refills | Status: DC

## 2021-07-31 MED ORDER — BENZONATATE 200 MG PO CAPS
200.0000 mg | ORAL_CAPSULE | Freq: Two times a day (BID) | ORAL | 0 refills | Status: DC | PRN
Start: 2021-07-31 — End: 2021-08-30

## 2021-07-31 NOTE — Progress Notes (Signed)
Telemedicine Encounter- SOAP NOTE Established Patient MyChart video conference Patient: Home  Provider: Office   Patient present only  This video telephone encounter was conducted with the patient's (or proxy's) verbal consent via audio telecommunications: yes/no: Yes Patient was instructed to have this encounter in a suitably private space; and to only have persons present to whom they give permission to participate. In addition, patient identity was confirmed by use of name plus two identifiers (DOB and address).  I discussed the limitations, risks, security and privacy concerns of performing an evaluation and management service by telephone and the availability of in person appointments. I also discussed with the patient that there may be a patient responsible charge related to this service. The patient expressed understanding and agreed to proceed.  I spent a total of TIME; 0 MIN TO 60 MIN: 20 minutes talking with the patient or their proxy.  No chief complaint on file.   Subjective   [name] is a 60 y.o. established patient. Telephone visit today for  HPI   Patient Active Problem List   Diagnosis Date Noted   DDD (degenerative disc disease), lumbar 03/07/2021   Screening for cervical cancer 07/07/2020   Essential hypertension 03/01/2020   Occipital neuralgia of right side 01/05/2020   Atherosclerosis of aorta ([place]) 03/31/2019   Dyslipidemia, goal LDL below 100 03/31/2019   Visit for screening mammogram 03/31/2019   Allergic rhinitis 02/22/2017   Overweight (BMI 25.0-29.9) 03/22/2016   Preventative health care 09/09/2014   GAD (generalized anxiety disorder) 04/13/2010   PREMATURE VENTRICULAR CONTRACTIONS 02/06/2010   ELECTROCARDIOGRAM, ABNORMAL 02/06/2010   MIGRAINE HEADACHE 07/24/2007   GASTROESOPHAGEAL REFLUX DISEASE 07/24/2007    Past Medical History:  Diagnosis Date   Depression    DJD (degenerative joint disease)    GERD (gastroesophageal reflux  disease)    History of palpitations    Migraine headache    PVC's (premature ventricular contractions)    Originating from [facility]    Current Outpatient Medications  Medication Sig Dispense Refill   pravastatin (PRAVACHOL) 10 MG tablet Take 1 tablet (10 mg total) by mouth daily. 90 tablet 1   ALPRAZolam (XANAX) 0.5 MG tablet TAKE 1 TABLET BY MOUTH AT BEDTIME AS NEEDED FOR ANXIETY (Patient taking differently: Take 0.25 mg by mouth daily as needed.) 30 tablet 5   fluticasone (FLONASE) 50 MCG/ACT nasal spray Place 2 sprays into both nostrils daily. 48 g 1   HYDROcodone-acetaminophen (NORCO) 7.5-325 MG tablet Take 1 tablet by mouth every 8 (eight) hours as needed.     indapamide (LOZOL) 2.5 MG tablet TAKE 1 TABLET(2.5 MG) BY MOUTH DAILY 90 tablet 0   levocetirizine (XYZAL) 5 MG tablet TAKE 1 TABLET(5 MG) BY MOUTH EVERY EVENING 90 tablet 1   LINZESS 145 MCG CAPS capsule Take 145 mcg by mouth daily as needed.     tiZANidine (ZANAFLEX) 2 MG tablet Take 2 mg by mouth 3 (three) times daily as needed.     No current facility-administered medications for this visit.    Allergies  Allergen Reactions   Livalo [Pitavastatin] Other (See Comments)    Muscle aches   Anesthesia S-I-40 [Propofol]    Augmentin [Amoxicillin-Pot Clavulanate] Nausea And Vomiting   Doxycycline     Nausea    Social History   Socioeconomic History   Marital status: Married    Spouse name: Not on file   Number of children: 2   Years of education: 12   Highest education level: Not  on file  Occupational History   Occupation: Retired/disabled  Tobacco Use   Smoking status: Former    Packs/day: 1.00    Years: 36.00    Pack years: 36.00    Types: Cigarettes    Quit date: 09/2019    Years since quitting: 1.8   Smokeless tobacco: Never  Vaping Use   Vaping Use: Never used  Substance and Sexual Activity   Alcohol use: No   Drug use: No   Sexual activity: Yes  Other Topics Concern   Not on file  Social History  Narrative   Occupation: retired [employer]   Married   Alcohol no   Drug use no, no caffiene   Tobacco use- yes quit Dec '08   Regular Exercise no   4 children; reports increased stress in due to caring for grandchildren   UNEMPLOYED      Lives at home w/ her husband   Right-handed   Caffeine: 1 cup of coffee    Social Determinants of Health   Financial Resource Strain: Low Risk    Difficulty of Paying Living Expenses: Not hard at all  Food Insecurity: No Food Insecurity   Worried About Charity fundraiser in the Last Year: Never true   [place] in the Last Year: Never true  Transportation Needs: No Transportation Needs   Lack of Transportation (Medical): No   Lack of Transportation (Non-Medical): No  Physical Activity: Inactive   Days of Exercise per Week: 0 days   Minutes of Exercise per Session: 0 min  Stress: No Stress Concern Present   Feeling of Stress : Not at all  Social Connections: Socially Integrated   Frequency of Communication with Friends and Family: More than three times a week   Frequency of Social Gatherings with Friends and Family: Once a week   Attends Religious Services: More than 4 times per year   Active Member of [employer] or Organizations: Yes   Attends Music therapist: More than 4 times per year   Marital Status: Married  Human resources officer Violence: Not on file    Review of Systems  Constitutional: Negative.  Negative for chills and fever.  HENT:  Positive for congestion and sinus pain. Negative for sore throat.   Respiratory:  Positive for cough and sputum production. Negative for shortness of breath.   Cardiovascular:  Negative for chest pain and palpitations.  Gastrointestinal:  Negative for abdominal pain, diarrhea, nausea and vomiting.  Genitourinary: Negative.   Skin: Negative.  Negative for rash.  Neurological:  Negative for dizziness and headaches.  All other systems reviewed and are  negative.  Objective  Alert and oriented x3 in no apparent respiratory distress Vitals as reported by the patient: There were no vitals filed for this visit.  Diagnoses and all orders for this visit:  Acute non-recurrent maxillary sinusitis -     azithromycin (ZITHROMAX) 250 MG tablet; Sig as indicated  Acute cough -     benzonatate (TESSALON) 200 MG capsule; Take 1 capsule (200 mg total) by mouth 2 (two) times daily as needed for cough.  Sinus congestion -     methylPREDNISolone (MEDROL DOSEPAK) 4 MG TBPK tablet; Sig as indicated    Clinically stable.  No red flag signs or symptoms.  Viral infection developing secondary bacterial infection.  Will start Z-Pak. May benefit from [place].  Tessalon for cough.  Advised to rest and increase fluid intake. ED precautions given.  Advised  to contact the office if no better or worse during the next several days.  I discussed the assessment and treatment plan with the patient. The patient was provided an opportunity to ask questions and all were answered. The patient agreed with the plan and demonstrated an understanding of the instructions.   The patient was advised to call back or seek an in-person evaluation if the symptoms worsen or if the condition fails to improve as anticipated.  I provided 20 minutes of non-face-to-face time during this encounter.  Horald Pollen, MD  Primary Care at [facility]

## 2021-08-04 ENCOUNTER — Telehealth: Payer: Self-pay | Admitting: Internal Medicine

## 2021-08-04 NOTE — Telephone Encounter (Signed)
Pt has been informed to be seen at her local urgent care or ED for chest xray. She stated that she would go.

## 2021-08-04 NOTE — Telephone Encounter (Signed)
Re: benzonatate (TESSALON) 200 MG capsule  Patient calling to report severe coughing . Patient had virtual visit 10/3 States Tessalon not helping  Requesting alternative

## 2021-08-05 ENCOUNTER — Other Ambulatory Visit: Payer: Self-pay | Admitting: Internal Medicine

## 2021-08-05 DIAGNOSIS — J309 Allergic rhinitis, unspecified: Secondary | ICD-10-CM

## 2021-08-29 ENCOUNTER — Telehealth: Payer: Self-pay | Admitting: Internal Medicine

## 2021-08-29 NOTE — Telephone Encounter (Signed)
1.Medication Requested: ALPRAZolam (XANAX) 0.5 MG tablet  2. Pharmacy (Name, Street, [place]): [id number] - [place], [address]  Phone:  [phone] Fax:  [phone]   3. On Med List: yes  4. Last Visit with PCP: 10.03.22 (sagardia)  5. Next visit date with PCP: 11.16.22   Agent: Please be advised that RX refills may take up to 3 business days. We ask that you follow-up with your pharmacy.

## 2021-08-30 ENCOUNTER — Other Ambulatory Visit: Payer: Self-pay | Admitting: Internal Medicine

## 2021-08-30 DIAGNOSIS — F411 Generalized anxiety disorder: Secondary | ICD-10-CM

## 2021-08-30 MED ORDER — ALPRAZOLAM 0.5 MG PO TABS
0.2500 mg | ORAL_TABLET | Freq: Every day | ORAL | 0 refills | Status: DC
Start: 2021-08-30 — End: 2021-12-11

## 2021-08-31 ENCOUNTER — Other Ambulatory Visit: Payer: Self-pay

## 2021-08-31 ENCOUNTER — Emergency Department (HOSPITAL_COMMUNITY)
Admission: EM | Admit: 2021-08-31 | Discharge: 2021-08-31 | Disposition: A | Discharge status: Home / Self Care | Payer: Medicare Other | Attending: Emergency Medicine | Admitting: Emergency Medicine

## 2021-08-31 DIAGNOSIS — R197 Diarrhea, unspecified: Secondary | ICD-10-CM | POA: Insufficient documentation

## 2021-08-31 DIAGNOSIS — R509 Fever, unspecified: Secondary | ICD-10-CM | POA: Diagnosis present

## 2021-08-31 DIAGNOSIS — Z5321 Procedure and treatment not carried out due to patient leaving prior to being seen by health care provider: Secondary | ICD-10-CM | POA: Diagnosis not present

## 2021-08-31 NOTE — ED Notes (Signed)
Pt stated: "I feel better, I'm going home".

## 2021-09-13 ENCOUNTER — Ambulatory Visit (INDEPENDENT_AMBULATORY_CARE_PROVIDER_SITE_OTHER): Payer: Medicare Other | Admitting: Internal Medicine

## 2021-09-13 ENCOUNTER — Encounter: Payer: Self-pay | Admitting: Internal Medicine

## 2021-09-13 ENCOUNTER — Other Ambulatory Visit: Payer: Self-pay

## 2021-09-13 VITALS — BP 142/96 | HR 61 | Temp 98.0°F | Resp 16 | Ht 70.5 in | Wt 186.0 lb

## 2021-09-13 DIAGNOSIS — E785 Hyperlipidemia, unspecified: Secondary | ICD-10-CM

## 2021-09-13 DIAGNOSIS — Z720 Tobacco use: Secondary | ICD-10-CM

## 2021-09-13 DIAGNOSIS — E876 Hypokalemia: Secondary | ICD-10-CM | POA: Diagnosis not present

## 2021-09-13 DIAGNOSIS — T502X5A Adverse effect of carbonic-anhydrase inhibitors, benzothiadiazides and other diuretics, initial encounter: Secondary | ICD-10-CM

## 2021-09-13 DIAGNOSIS — R739 Hyperglycemia, unspecified: Secondary | ICD-10-CM | POA: Insufficient documentation

## 2021-09-13 DIAGNOSIS — Z0000 Encounter for general adult medical examination without abnormal findings: Secondary | ICD-10-CM | POA: Diagnosis not present

## 2021-09-13 DIAGNOSIS — Z23 Encounter for immunization: Secondary | ICD-10-CM | POA: Diagnosis not present

## 2021-09-13 DIAGNOSIS — Z124 Encounter for screening for malignant neoplasm of cervix: Secondary | ICD-10-CM

## 2021-09-13 DIAGNOSIS — I10 Essential (primary) hypertension: Secondary | ICD-10-CM | POA: Diagnosis not present

## 2021-09-13 DIAGNOSIS — K5904 Chronic idiopathic constipation: Secondary | ICD-10-CM

## 2021-09-13 DIAGNOSIS — Z1231 Encounter for screening mammogram for malignant neoplasm of breast: Secondary | ICD-10-CM

## 2021-09-13 LAB — CBC WITH DIFFERENTIAL/PLATELET
Basophils Absolute: 0.1 10*3/uL (ref 0.0–0.1)
Basophils Relative: 1.2 % (ref 0.0–3.0)
Eosinophils Absolute: 0.1 10*3/uL (ref 0.0–0.7)
Eosinophils Relative: 1.7 % (ref 0.0–5.0)
HCT: 41.7 % (ref 36.0–46.0)
Hemoglobin: 13.6 g/dL (ref 12.0–15.0)
Lymphocytes Relative: 54.9 % — ABNORMAL HIGH (ref 12.0–46.0)
Lymphs Abs: 2.8 10*3/uL (ref 0.7–4.0)
MCHC: 32.7 g/dL (ref 30.0–36.0)
MCV: 87.4 fl (ref 78.0–100.0)
Monocytes Absolute: 0.2 10*3/uL (ref 0.1–1.0)
Monocytes Relative: 4.5 % (ref 3.0–12.0)
Neutro Abs: 1.9 10*3/uL (ref 1.4–7.7)
Neutrophils Relative %: 37.7 % — ABNORMAL LOW (ref 43.0–77.0)
Platelets: 395 10*3/uL (ref 150.0–400.0)
RBC: 4.77 Mil/uL (ref 3.87–5.11)
RDW: 12.8 % (ref 11.5–15.5)
WBC: 5.1 10*3/uL (ref 4.0–10.5)

## 2021-09-13 LAB — BASIC METABOLIC PANEL
BUN: 10 mg/dL (ref 6–23)
CO2: 29 mEq/L (ref 19–32)
Calcium: 10 mg/dL (ref 8.4–10.5)
Chloride: 100 mEq/L (ref 96–112)
Creatinine, Ser: 0.78 mg/dL (ref 0.40–1.20)
GFR: 82.54 mL/min (ref >60.00)
Glucose, Bld: 98 mg/dL (ref 70–99)
Potassium: 3.3 mEq/L — ABNORMAL LOW (ref 3.5–5.1)
Sodium: 138 mEq/L (ref 135–145)

## 2021-09-13 LAB — LIPID PANEL
Cholesterol: 150 mg/dL (ref 0–200)
HDL: 55.4 mg/dL (ref >39.00)
LDL Cholesterol: 80 mg/dL (ref 0–99)
NonHDL: 94.38
Total CHOL/HDL Ratio: 3
Triglycerides: 71 mg/dL (ref 0.0–149.0)
VLDL: 14.2 mg/dL (ref 0.0–40.0)

## 2021-09-13 LAB — HEPATIC FUNCTION PANEL
ALT: 16 U/L (ref 0–35)
AST: 18 U/L (ref 0–37)
Albumin: 4.4 g/dL (ref 3.5–5.2)
Alkaline Phosphatase: 66 U/L (ref 39–117)
Bilirubin, Direct: 0.1 mg/dL (ref 0.0–0.3)
Total Bilirubin: 1 mg/dL (ref 0.2–1.2)
Total Protein: 8.3 g/dL (ref 6.0–8.3)

## 2021-09-13 LAB — HEMOGLOBIN A1C: Hgb A1c MFr Bld: 5.6 % (ref 4.6–6.5)

## 2021-09-13 LAB — TSH: TSH: 1.23 u[IU]/mL (ref 0.35–5.50)

## 2021-09-13 MED ORDER — POTASSIUM CHLORIDE CRYS ER 15 MEQ PO TBCR: 15.0000 meq | EXTENDED_RELEASE_TABLET | Freq: Two times a day (BID) | ORAL | 0 refills | Status: DC

## 2021-09-13 NOTE — Progress Notes (Signed)
Subjective:  Patient ID: [name], female    DOB: 11/21/1960  Age: 60 y.o. MRN: [id number]  CC: Annual Exam, Hypertension, and Hyperlipidemia  This visit occurred during the SARS-CoV-2 public health emergency.  Safety protocols were in place, including screening questions prior to the visit, additional usage of staff PPE, and extensive cleaning of exam room while observing appropriate contact time as indicated for disinfecting solutions.    HPI [name] presents for a CPX and f/up -   She does not monitor her blood pressure.  She has rare episodes of dizziness but denies headache, blurred vision, chest pain, shortness of breath, dyspnea on exertion, diaphoresis, edema, or fatigue.  Outpatient Medications Prior to Visit  Medication Sig Dispense Refill   ALPRAZolam (XANAX) 0.5 MG tablet Take 0.5 tablets (0.25 mg total) by mouth at bedtime. 90 tablet 0   fluticasone (FLONASE) 50 MCG/ACT nasal spray SHAKE LIQUID AND USE 2 SPRAYS IN EACH NOSTRIL DAILY 48 g 1   indapamide (LOZOL) 2.5 MG tablet TAKE 1 TABLET(2.5 MG) BY MOUTH DAILY 90 tablet 0   levocetirizine (XYZAL) 5 MG tablet TAKE 1 TABLET(5 MG) BY MOUTH EVERY EVENING 90 tablet 1   pravastatin (PRAVACHOL) 10 MG tablet Take 1 tablet (10 mg total) by mouth daily. 90 tablet 1   tiZANidine (ZANAFLEX) 2 MG tablet Take 2 mg by mouth 3 (three) times daily as needed.     HYDROcodone-acetaminophen (NORCO) 7.5-325 MG tablet Take 1 tablet by mouth every 8 (eight) hours as needed.     LINZESS 145 MCG CAPS capsule Take 145 mcg by mouth daily as needed.     No facility-administered medications prior to visit.    ROS Review of Systems  Constitutional:  Negative for chills, diaphoresis, fatigue and unexpected weight change.  HENT: Negative.    Eyes: Negative.   Respiratory:  Negative for cough, chest tightness, shortness of breath and wheezing.   Cardiovascular:  Negative for chest pain, palpitations and leg swelling.  Gastrointestinal:   Positive for constipation. Negative for abdominal pain, diarrhea, nausea and vomiting.  Endocrine: Negative.   Genitourinary: Negative.  Negative for difficulty urinating.  Musculoskeletal: Negative.  Negative for myalgias.  Skin: Negative.  Negative for color change and pallor.  Neurological:  Positive for dizziness. Negative for syncope, weakness, light-headedness and headaches.  Hematological:  Negative for adenopathy. Does not bruise/bleed easily.  Psychiatric/Behavioral:  Negative for dysphoric mood, sleep disturbance and suicidal ideas. The patient is nervous/anxious.    Objective:  BP (!) 142/96 (BP Location: Right Arm, Patient Position: Sitting, Cuff Size: Large)   Pulse 61   Temp 98 F (36.7 C) (Oral)   Resp 16   Ht 5' 10.5" (1.791 m)   Wt 186 lb (84.4 kg)   SpO2 98%   BMI 26.31 kg/m   BP Readings from Last 3 Encounters:  09/13/21 (!) 142/96  08/31/21 122/82  03/23/21 130/90    Wt Readings from Last 3 Encounters:  09/13/21 186 lb (84.4 kg)  03/23/21 192 lb (87.1 kg)  03/07/21 200 lb (90.7 kg)    Physical Exam Vitals reviewed.  Constitutional:      Appearance: Normal appearance.  HENT:     Nose: Nose normal.     Mouth/Throat:     Mouth: Mucous membranes are moist.  Eyes:     General: No scleral icterus.    Conjunctiva/sclera: Conjunctivae normal.  Cardiovascular:     Rate and Rhythm: Normal rate and regular rhythm.  Pulses: Normal pulses.     Heart sounds: No murmur heard. Pulmonary:     Effort: Pulmonary effort is normal.     Breath sounds: No stridor. No wheezing, rhonchi or rales.  Abdominal:     General: Abdomen is flat.     Palpations: There is no mass.     Tenderness: There is no abdominal tenderness. There is no guarding.     Hernia: No hernia is present.  Musculoskeletal:     Cervical back: Neck supple.  Lymphadenopathy:     Cervical: No cervical adenopathy.  Neurological:     Mental Status: She is alert.    Lab Results  Component  Value Date   WBC 5.1 09/13/2021   HGB 13.6 09/13/2021   HCT 41.7 09/13/2021   PLT 395.0 09/13/2021   GLUCOSE 98 09/13/2021   CHOL 150 09/13/2021   TRIG 71.0 09/13/2021   HDL 55.40 09/13/2021   LDLCALC 80 09/13/2021   ALT 16 09/13/2021   AST 18 09/13/2021   NA 138 09/13/2021   K 3.3 (L) 09/13/2021   CL 100 09/13/2021   CREATININE 0.78 09/13/2021   BUN 10 09/13/2021   CO2 29 09/13/2021   TSH 1.23 09/13/2021   HGBA1C 5.6 09/13/2021    No results found.  Assessment & Plan:   [name] was seen today for annual exam, hypertension and hyperlipidemia.  Diagnoses and all orders for this visit:  Essential hypertension- Her blood pressure is not adequately well controlled.  I will treat the hypokalemia and will add amlodipine to indapamide. -     CBC with Differential/Platelet; Future -     Basic metabolic panel; Future -     TSH; Future -     Hepatic function panel; Future -     Hepatic function panel -     TSH -     Basic metabolic panel -     CBC with Differential/Platelet -     potassium chloride SA (KLOR-CON M15) 15 MEQ tablet; Take 1 tablet (15 mEq total) by mouth 2 (two) times daily. -     amLODipine (NORVASC) 5 MG tablet; Take 1 tablet (5 mg total) by mouth daily.  Dyslipidemia, goal LDL below 100- LDL goal achieved. Doing well on the statin  -     Lipid panel; Future -     TSH; Future -     Hepatic function panel; Future -     Hepatic function panel -     TSH -     Lipid panel  Chronic hyperglycemia- Her A1c is normal. -     Hemoglobin A1c; Future -     Hemoglobin A1c  Routine general medical examination at a health care facility- Exam completed, labs reviewed, vaccines reviewed and updated, cancer screenings addressed, patient education was given.  Visit for screening mammogram -     MM DIGITAL SCREENING BILATERAL; Future  Screening for cervical cancer -     Ambulatory referral to Gynecology  Diuretic-induced hypokalemia -     potassium chloride SA (KLOR-CON  M15) 15 MEQ tablet; Take 1 tablet (15 mEq total) by mouth 2 (two) times daily.  Tobacco abuse -     Ambulatory Referral for Lung Cancer Scre  Chronic idiopathic constipation -     LINZESS 145 MCG CAPS capsule; Take 1 capsule (145 mcg total) by mouth daily before breakfast.  Other orders -     Flu Vaccine QUAD 6+ mos PF IM (Fluarix Quad PF) -  Varicella-zoster vaccine IM (Shingrix)  I have discontinued Kimisha C. Arroyave's HYDROcodone-acetaminophen. I have also changed her Linzess. Additionally, I am having her start on potassium chloride SA and amLODipine. Lastly, I am having her maintain her levocetirizine, tiZANidine, pravastatin, indapamide, fluticasone, and ALPRAZolam.  Meds ordered this encounter  Medications   potassium chloride SA (KLOR-CON M15) 15 MEQ tablet    Sig: Take 1 tablet (15 mEq total) by mouth 2 (two) times daily.    Dispense:  180 tablet    Refill:  0   amLODipine (NORVASC) 5 MG tablet    Sig: Take 1 tablet (5 mg total) by mouth daily.    Dispense:  90 tablet    Refill:  1   LINZESS 145 MCG CAPS capsule    Sig: Take 1 capsule (145 mcg total) by mouth daily before breakfast.    Dispense:  90 each    Refill:  1      Follow-up: Return in about 3 months (around 12/14/2021).  Scarlette Calico, MD

## 2021-09-13 NOTE — Patient Instructions (Signed)

## 2021-09-15 ENCOUNTER — Encounter: Payer: Self-pay | Admitting: Internal Medicine

## 2021-09-15 ENCOUNTER — Telehealth: Payer: Medicare Other

## 2021-09-15 DIAGNOSIS — K5904 Chronic idiopathic constipation: Secondary | ICD-10-CM | POA: Insufficient documentation

## 2021-09-15 DIAGNOSIS — Z720 Tobacco use: Secondary | ICD-10-CM | POA: Insufficient documentation

## 2021-09-15 MED ORDER — LINZESS 145 MCG PO CAPS: 145.0000 ug | ORAL_CAPSULE | Freq: Every day | ORAL | 1 refills | Status: DC

## 2021-09-15 MED ORDER — AMLODIPINE BESYLATE 5 MG PO TABS: 5.0000 mg | ORAL_TABLET | Freq: Every day | ORAL | 1 refills | Status: DC

## 2021-09-15 NOTE — Progress Notes (Unsigned)
Chronic Care Management Pharmacy Note  09/15/2021 Name:  [name] MRN:  [id number] DOB:  1960-11-09  Summary: - Patient reports that she has been checking blood pressure since being started on indapamide, reports that blood pressures today were in range and <140/90 could not recall previous blood pressures   -Notes that her anxiety is controlled with alprazolam, only takes 1/2 tablet once weekly if that - Follows with Dr. Greta Doom for pain, controlled through hydrocodone and tizanidine use - LDL slightly elevated from goal, patient reports that she does eat a fair amount of beef in her diet, has not been as active as she previously had been walking with husband daily  Recommendations/Changes made from today's visit: - Patient to start pravastatin $RemoveBeforeDEI'10mg'XRHmtoHcReBuyvqJ$  daily to help lower cholesterol to goal (<100) and to start lowering intake of high cholesterol foods  -Patient to check blood pressure 2-3 times weekly, will reach out if blood pressure is averaging >140/90  Subjective: [name] is an 60 y.o. year old female who is a primary patient of Janith Lima, MD.  The CCM team was consulted for assistance with disease management and care coordination needs.    Engaged with patient by telephone for follow up visit in response to provider referral for pharmacy case management and/or care coordination services.   Consent to Services:  The patient was given the following information about Chronic Care Management services today, agreed to services, and gave verbal consent: 1. CCM service includes personalized support from designated clinical staff supervised by the primary care provider, including individualized plan of care and coordination with other care providers 2. 24/7 contact phone numbers for assistance for urgent and routine care needs. 3. Service will only be billed when office clinical staff spend 20 minutes or more in a month to coordinate care. 4. Only one practitioner may furnish  and bill the service in a calendar month. 5.The patient may stop CCM services at any time (effective at the end of the month) by phone call to the office staff. 6. The patient will be responsible for cost sharing (co-pay) of up to 20% of the service fee (after annual deductible is met). Patient agreed to services and consent obtained.  Patient Care Team: Janith Lima, MD as PCP - General (Internal Medicine) Satira Sark, MD as PCP - Cardiology (Cardiology) Delice Bison Darnelle Maffucci, [facility] as Pharmacist (Pharmacist)  Recent office visits: 09/13/2021 - Dr. Ronnald Ramp  - started on amlodipine and indapamide for improved BP control, potassium chloride prescribed to treat hypokalemia 07/31/2021 - Dr. Mitchel Honour  - evaluation of sinusitis - prescribed medrol dosepak, z-pack, and benzonatate   Recent consult visits:  None since last visit    Hospital visits:   None since last visit   Objective:  Lab Results  Component Value Date   CREATININE 0.78 09/13/2021   BUN 10 09/13/2021   GFR 82.54 09/13/2021   GFRNONAA >60 02/12/2021   GFRAA 100 07/07/2020   NA 138 09/13/2021   K 3.3 (L) 09/13/2021   CALCIUM 10.0 09/13/2021   CO2 29 09/13/2021   GLUCOSE 98 09/13/2021    Lab Results  Component Value Date/Time   HGBA1C 5.6 09/13/2021 12:03 PM   HGBA1C 5.4 01/16/2018 11:58 AM   GFR 82.54 09/13/2021 12:03 PM   GFR 78.25 10/06/2020 09:30 AM    Last diabetic Eye exam:  No results found for: HMDIABEYEEXA  Last diabetic Foot exam:  No results found for: HMDIABFOOTEX   Lab Results  Component Value Date   CHOL 150 09/13/2021   HDL 55.40 09/13/2021   LDLCALC 80 09/13/2021   TRIG 71.0 09/13/2021   CHOLHDL 3 09/13/2021    Hepatic Function Latest Ref Rng & Units 09/13/2021 07/07/2020 03/31/2019  Total Protein 6.0 - 8.3 g/dL 8.3 7.9 7.9  Albumin 3.5 - 5.2 g/dL 4.4 - 4.1  AST 0 - 37 U/L $Remo'18 18 15  'lvcPK$ ALT 0 - 35 U/L $Remo'16 16 16  'IZTkY$ Alk Phosphatase 39 - 117 U/L 66 - 76  Total Bilirubin 0.2 - 1.2 mg/dL 1.0 1.3(H)  0.9  Bilirubin, Direct 0.0 - 0.3 mg/dL 0.1 0.2 -    Lab Results  Component Value Date/Time   TSH 1.23 09/13/2021 12:03 PM   TSH 1.67 07/07/2020 11:30 AM    CBC Latest Ref Rng & Units 09/13/2021 02/12/2021 10/06/2020  WBC 4.0 - 10.5 K/uL 5.1 8.8 6.4  Hemoglobin 12.0 - 15.0 g/dL 13.6 13.4 13.4  Hematocrit 36.0 - 46.0 % 41.7 42.1 40.5  Platelets 150.0 - 400.0 K/uL 395.0 385 367.0    Lab Results  Component Value Date/Time   VD25OH 13.19 (L) 03/22/2016 02:50 PM    Clinical ASCVD: No  The 10-year ASCVD risk score (Arnett DK, et al., 2019) is: 13.5%   Values used to calculate the score:     Age: 60 years     Sex: Female     Is Non-Hispanic African American: Yes     Diabetic: No     Tobacco smoker: Yes     Systolic Blood Pressure: 297 mmHg     Is BP treated: Yes     HDL Cholesterol: 55.4 mg/dL     Total Cholesterol: 150 mg/dL    Depression screen [facility] 2/9 02/14/2021 12/15/2020 10/06/2020  Decreased Interest 0 0 0  Down, Depressed, Hopeless 0 0 0  PHQ - 2 Score 0 0 0  Some recent data might be hidden    Social History   Tobacco Use  Smoking Status Every Day   Packs/day: 1.00   Years: 36.00   Pack years: 36.00   Types: Cigarettes   Last attempt to quit: 09/2019   Years since quitting: 1.9  Smokeless Tobacco Never   BP Readings from Last 3 Encounters:  09/13/21 (!) 142/96  08/31/21 122/82  03/23/21 130/90   Pulse Readings from Last 3 Encounters:  09/13/21 61  08/31/21 76  03/23/21 90   Wt Readings from Last 3 Encounters:  09/13/21 186 lb (84.4 kg)  03/23/21 192 lb (87.1 kg)  03/07/21 200 lb (90.7 kg)   BMI Readings from Last 3 Encounters:  09/13/21 26.31 kg/m  03/23/21 27.16 kg/m  03/07/21 28.70 kg/m    Assessment/Interventions: Review of patient past medical history, allergies, medications, health status, including review of consultants reports, laboratory and other test data, was performed as part of comprehensive evaluation and provision of chronic care  management services.   SDOH:  (Social Determinants of Health) assessments and interventions performed: Yes  SDOH Screenings   Alcohol Screen: Low Risk    Last Alcohol Screening Score (AUDIT): 0  Depression (PHQ2-9): Low Risk    PHQ-2 Score: 0  Financial Resource Strain: Low Risk    Difficulty of Paying Living Expenses: Not hard at all  Food Insecurity: No Food Insecurity   Worried About Charity fundraiser in the Last Year: Never true   Ran Out of Food in the Last Year: Never true  Housing: [place]  Score: 0  Physical Activity: Inactive   Days of Exercise per Week: 0 days   Minutes of Exercise per Session: 0 min  Social Connections: Socially Integrated   Frequency of Communication with Friends and Family: More than three times a week   Frequency of Social Gatherings with Friends and Family: Once a week   Attends Religious Services: More than 4 times per year   Active Member of [employer] or Organizations: Yes   Attends Music therapist: More than 4 times per year   Marital Status: Married  Stress: No Stress Concern Present   Feeling of Stress : Not at all  Tobacco Use: High Risk   Smoking Tobacco Use: Every Day   Smokeless Tobacco Use: Never   Passive Exposure: Not on file  Transportation Needs: No Transportation Needs   Lack of Transportation (Medical): No   Lack of Transportation (Non-Medical): No    CCM Care Plan  Allergies  Allergen Reactions   Livalo [Pitavastatin] Other (See Comments)    Muscle aches   Anesthesia S-I-40 [Propofol]    Augmentin [Amoxicillin-Pot Clavulanate] Nausea And Vomiting   Doxycycline     Nausea    Medications Reviewed Today     Reviewed by Janith Lima, MD (Physician) on 09/13/21 at 1450  Med List Status: <None>   Medication Order Taking? Sig Documenting Provider Last Dose Status Informant  ALPRAZolam (XANAX) 0.5 MG tablet [id number] Yes Take 0.5 tablets (0.25 mg total) by mouth at bedtime. Janith Lima, MD Taking Active   fluticasone [facility]) 50 MCG/ACT nasal spray [id number] Yes SHAKE LIQUID AND USE 2 SPRAYS IN EACH NOSTRIL DAILY Janith Lima, MD Taking Active   HYDROcodone-acetaminophen [facility]) 7.5-325 MG tablet [id number] Yes Take 1 tablet by mouth every 8 (eight) hours as needed. [provider] Taking Active Self  indapamide (LOZOL) 2.5 MG tablet [id number] Yes TAKE 1 TABLET(2.5 MG) BY MOUTH DAILY Janith Lima, MD Taking Active   levocetirizine (XYZAL) 5 MG tablet [id number] Yes TAKE 1 TABLET(5 MG) BY MOUTH EVERY Georgette Dover, MD Taking Active   LINZESS 145 MCG CAPS capsule [id number] Yes Take 145 mcg by mouth daily as needed. [provider] Taking Active Self  potassium chloride SA (KLOR-CON M15) 15 MEQ tablet [id number] Yes Take 1 tablet (15 mEq total) by mouth 2 (two) times daily. Janith Lima, MD  Active   pravastatin (PRAVACHOL) 10 MG tablet [id number] Yes Take 1 tablet (10 mg total) by mouth daily. Janith Lima, MD Taking Active   tiZANidine (ZANAFLEX) 2 MG tablet [id number] Yes Take 2 mg by mouth 3 (three) times daily as needed. [provider] Taking Active Self            Patient Active Problem List   Diagnosis Date Noted   Tobacco abuse 09/15/2021   Chronic idiopathic constipation 09/15/2021   Chronic hyperglycemia 09/13/2021   Routine general medical examination at a health care facility 09/13/2021   Diuretic-induced hypokalemia 09/13/2021   DDD (degenerative disc disease), lumbar 03/07/2021   Screening for cervical cancer 07/07/2020   Essential hypertension 03/01/2020   Occipital neuralgia of right side 01/05/2020   Atherosclerosis of aorta ([place]) 03/31/2019   Dyslipidemia, goal LDL below 100 03/31/2019   Visit for screening mammogram 03/31/2019   Allergic rhinitis 02/22/2017   Overweight (BMI 25.0-29.9) 03/22/2016   GAD (generalized anxiety disorder) 04/13/2010   PREMATURE VENTRICULAR CONTRACTIONS 02/06/2010    MIGRAINE HEADACHE 07/24/2007   GASTROESOPHAGEAL REFLUX DISEASE 07/24/2007  Immunization History  Administered Date(s) Administered   Influenza Split 11/24/2012   Influenza,inj,Quad PF,6+ Mos 08/20/2014, 12/02/2015, 08/20/2017, 06/22/2019, 07/07/2020, 09/13/2021   PFIZER(Purple Top)SARS-COV-2 Vaccination 01/02/2020, 02/02/2020, 10/10/2020   Tdap 09/09/2014   Zoster Recombinat (Shingrix) 09/13/2021    Conditions to be addressed/monitored:  Hypertension, Hyperlipidemia, Anxiety, Allergic Rhinitis, and Chronic pain  There are no care plans that you recently modified to display for this patient.     Medication Assistance: None required.  Patient affirms current coverage meets needs.  Patient's preferred pharmacy is:  [facility] [phone] - [place], [place] AT [place] [address] [place] [address] Phone: [phone] Fax: [phone]   Uses pill box? Yes Pt endorses 100% compliance  Care Plan and Follow Up Patient Decision:  Patient agrees to Care Plan and Follow-up.  Plan: Telephone follow up appointment with care management team member scheduled for:  3 months and The patient has been provided with contact information for the care management team and has been advised to call with any health related questions or concerns.   Tomasa Blase, PharmD Clinical Pharmacist, [place]   Chart prep = 8 minutes

## 2021-09-18 ENCOUNTER — Other Ambulatory Visit: Payer: Self-pay | Admitting: Internal Medicine

## 2021-09-18 ENCOUNTER — Ambulatory Visit
Admission: RE | Admit: 2021-09-18 | Discharge: 2021-09-18 | Disposition: A | Discharge status: Home / Self Care | Payer: Medicare Other | Source: Ambulatory Visit | Attending: Internal Medicine | Admitting: Internal Medicine

## 2021-09-18 DIAGNOSIS — Z1231 Encounter for screening mammogram for malignant neoplasm of breast: Secondary | ICD-10-CM

## 2021-09-20 ENCOUNTER — Telehealth: Payer: Medicare Other

## 2021-09-20 NOTE — Progress Notes (Incomplete)
Chronic Care Management Pharmacy Note  09/20/2021 Name:  [name] MRN:  [id number] DOB:  [birth year]  Summary: - Patient reports that she has been checking blood pressure since being started on indapamide, reports that blood pressures today were in range and <140/90 could not recall previous blood pressures   -Notes that her anxiety is controlled with alprazolam, only takes 1/2 tablet once weekly if that - Follows with Dr. Greta Doom for pain, controlled through hydrocodone and tizanidine use - LDL slightly elevated from goal, patient reports that she does eat a fair amount of beef in her diet, has not been as active as she previously had been walking with husband daily  Recommendations/Changes made from today's visit: - Patient to start pravastatin 27m daily to help lower cholesterol to goal (<100) and to start lowering intake of high cholesterol foods  -Patient to check blood pressure 2-3 times weekly, will reach out if blood pressure is averaging >140/90  Subjective: Laura NISLEYis an 60y.o. 60 year old female who is a primary patient of Laura Lima MD.  The CCM team was consulted for assistance with disease management and care coordination needs.    Engaged with patient by telephone for follow up visit in response to provider referral for pharmacy case management and/or care coordination services.   Consent to Services:  The patient was given the following information about Chronic Care Management services today, agreed to services, and gave verbal consent: 1. CCM service includes personalized support from designated clinical staff supervised by the primary care provider, including individualized plan of care and coordination with other care providers 2. 24/7 contact phone numbers for assistance for urgent and routine care needs. 3. Service will only be billed when office clinical staff spend 20 minutes or more in a month to coordinate care. 4. Only one practitioner may furnish  and bill the service in a calendar month. 5.The patient may stop CCM services at any time (effective at the end of the month) by phone call to the office staff. 6. The patient will be responsible for cost sharing (co-pay) of up to 20% of the service fee (after annual deductible is met). Patient agreed to services and consent obtained.  Patient Care Team: Laura Lima MD as PCP - General (Internal Medicine) MSatira Sark MD as PCP - Cardiology (Cardiology) SDelice BisonDDarnelle Maffucci R[facility] Pharmacist (Pharmacist)  Recent office visits: 09/13/2021 - Dr. JRonnald Ramp - started on amlodipine and indapamide for improved BP control, potassium chloride prescribed to treat hypokalemia 07/31/2021 - Dr. SMitchel Honour - evaluation of sinusitis - prescribed medrol dosepak, z-pack, and benzonatate   Recent consult visits:  None since last visit    Hospital visits:   None since last visit   Objective:  Lab Results  Component Value Date   CREATININE 0.78 09/13/2021   BUN 10 09/13/2021   GFR 82.54 09/13/2021   GFRNONAA >60 02/12/2021   GFRAA 100 07/07/2020   NA 138 09/13/2021   K 3.3 (L) 09/13/2021   CALCIUM 10.0 09/13/2021   CO2 29 09/13/2021   GLUCOSE 98 09/13/2021    Lab Results  Component Value Date/Time   HGBA1C 5.6 09/13/2021 12:03 PM   HGBA1C 5.4 01/16/2018 11:58 AM   GFR 82.54 09/13/2021 12:03 PM   GFR 78.25 10/06/2020 09:30 AM    Last diabetic Eye exam:  No results found for: HMDIABEYEEXA  Last diabetic Foot exam:  No results found for: HMDIABFOOTEX   Lab Results  Component Value Date   CHOL 150 09/13/2021   HDL 55.40 09/13/2021   LDLCALC 80 09/13/2021   TRIG 71.0 09/13/2021   CHOLHDL 3 09/13/2021    Hepatic Function Latest Ref Rng & Units 09/13/2021 07/07/2020 03/31/2019  Total Protein 6.0 - 8.3 g/dL 8.3 7.9 7.9  Albumin 3.5 - 5.2 g/dL 4.4 - 4.1  AST 0 - 37 U/L '18 18 15  ' ALT 0 - 35 U/L '16 16 16  ' Alk Phosphatase 39 - 117 U/L 66 - 76  Total Bilirubin 0.2 - 1.2 mg/dL 1.0 1.3(H)  0.9  Bilirubin, Direct 0.0 - 0.3 mg/dL 0.1 0.2 -    Lab Results  Component Value Date/Time   TSH 1.23 09/13/2021 12:03 PM   TSH 1.67 07/07/2020 11:30 AM    CBC Latest Ref Rng & Units 09/13/2021 02/12/2021 10/06/2020  WBC 4.0 - 10.5 K/uL 5.1 8.8 6.4  Hemoglobin 12.0 - 15.0 g/dL 13.6 13.4 13.4  Hematocrit 36.0 - 46.0 % 41.7 42.1 40.5  Platelets 150.0 - 400.0 K/uL 395.0 385 367.0    Lab Results  Component Value Date/Time   VD25OH 13.19 (L) 03/22/2016 02:50 PM    Clinical ASCVD: No  The 10-year ASCVD risk score (Arnett DK, et al., 2019) is: 13.5%   Values used to calculate the score:     Age: 60 years     Sex: Female     Is Non-Hispanic African American: Yes     Diabetic: No     Tobacco smoker: Yes     Systolic Blood Pressure: 092 mmHg     Is BP treated: Yes     HDL Cholesterol: 55.4 mg/dL     Total Cholesterol: 150 mg/dL    Depression screen [facility] 2/9 02/14/2021 12/15/2020 10/06/2020  Decreased Interest 0 0 0  Down, Depressed, Hopeless 0 0 0  PHQ - 2 Score 0 0 0  Some recent data might be hidden    Social History   Tobacco Use  Smoking Status Every Day   Packs/day: 1.00   Years: 36.00   Pack years: 36.00   Types: Cigarettes   Last attempt to quit: 09/2019   Years since quitting: 1.9  Smokeless Tobacco Never   BP Readings from Last 3 Encounters:  09/13/21 (!) 142/96  08/31/21 122/82  03/23/21 130/90   Pulse Readings from Last 3 Encounters:  09/13/21 61  08/31/21 76  03/23/21 90   Wt Readings from Last 3 Encounters:  09/13/21 186 lb (84.4 kg)  03/23/21 192 lb (87.1 kg)  03/07/21 200 lb (90.7 kg)   BMI Readings from Last 3 Encounters:  09/13/21 26.31 kg/m  03/23/21 27.16 kg/m  03/07/21 28.70 kg/m    Assessment/Interventions: Review of patient past medical history, allergies, medications, health status, including review of consultants reports, laboratory and other test data, was performed as part of comprehensive evaluation and provision of chronic care  management services.   SDOH:  (Social Determinants of Health) assessments and interventions performed: Yes  SDOH Screenings   Alcohol Screen: Low Risk    Last Alcohol Screening Score (AUDIT): 0  Depression (PHQ2-9): Low Risk    PHQ-2 Score: 0  Financial Resource Strain: Low Risk    Difficulty of Paying Living Expenses: Not hard at all  Food Insecurity: No Food Insecurity   Worried About Charity fundraiser in the Last Year: Never true   Ran Out of Food in the Last Year: Never true  Housing: [place]  Score: 0  Physical Activity: Inactive   Days of Exercise per Week: 0 days   Minutes of Exercise per Session: 0 min  Social Connections: Socially Integrated   Frequency of Communication with Friends and Family: More than three times a week   Frequency of Social Gatherings with Friends and Family: Once a week   Attends Religious Services: More than 4 times per year   Active Member of [employer] or Organizations: Yes   Attends Music therapist: More than 4 times per year   Marital Status: Married  Stress: No Stress Concern Present   Feeling of Stress : Not at all  Tobacco Use: High Risk   Smoking Tobacco Use: Every Day   Smokeless Tobacco Use: Never   Passive Exposure: Not on file  Transportation Needs: No Transportation Needs   Lack of Transportation (Medical): No   Lack of Transportation (Non-Medical): No    CCM Care Plan  Allergies  Allergen Reactions   Livalo [Pitavastatin] Other (See Comments)    Muscle aches   Anesthesia S-I-40 [Propofol]    Augmentin [Amoxicillin-Pot Clavulanate] Nausea And Vomiting   Doxycycline     Nausea    Medications Reviewed Today     Reviewed by Janith Lima, MD (Physician) on 09/13/21 at 1450  Med List Status: <None>   Medication Order Taking? Sig Documenting Provider Last Dose Status Informant  ALPRAZolam (XANAX) 0.5 MG tablet [id number] Yes Take 0.5 tablets (0.25 mg total) by mouth at bedtime. Janith Lima, MD Taking Active   fluticasone [facility]) 50 MCG/ACT nasal spray [id number] Yes SHAKE LIQUID AND USE 2 SPRAYS IN EACH NOSTRIL DAILY Janith Lima, MD Taking Active   HYDROcodone-acetaminophen [facility]) 7.5-325 MG tablet [id number] Yes Take 1 tablet by mouth every 8 (eight) hours as needed. [provider] Taking Active Self  indapamide (LOZOL) 2.5 MG tablet [id number] Yes TAKE 1 TABLET(2.5 MG) BY MOUTH DAILY Janith Lima, MD Taking Active   levocetirizine (XYZAL) 5 MG tablet [id number] Yes TAKE 1 TABLET(5 MG) BY MOUTH EVERY Georgette Dover, MD Taking Active   LINZESS 145 MCG CAPS capsule [id number] Yes Take 145 mcg by mouth daily as needed. [provider] Taking Active Self  potassium chloride SA (KLOR-CON M15) 15 MEQ tablet [id number] Yes Take 1 tablet (15 mEq total) by mouth 2 (two) times daily. Janith Lima, MD  Active   pravastatin (PRAVACHOL) 10 MG tablet [id number] Yes Take 1 tablet (10 mg total) by mouth daily. Janith Lima, MD Taking Active   tiZANidine (ZANAFLEX) 2 MG tablet [id number] Yes Take 2 mg by mouth 3 (three) times daily as needed. [provider] Taking Active Self            Patient Active Problem List   Diagnosis Date Noted   Tobacco abuse 09/15/2021   Chronic idiopathic constipation 09/15/2021   Chronic hyperglycemia 09/13/2021   Routine general medical examination at a health care facility 09/13/2021   Diuretic-induced hypokalemia 09/13/2021   DDD (degenerative disc disease), lumbar 03/07/2021   Screening for cervical cancer 07/07/2020   Essential hypertension 03/01/2020   Occipital neuralgia of right side 01/05/2020   Atherosclerosis of aorta ([place]) 03/31/2019   Dyslipidemia, goal LDL below 100 03/31/2019   Visit for screening mammogram 03/31/2019   Allergic rhinitis 02/22/2017   Overweight (BMI 25.0-29.9) 03/22/2016   GAD (generalized anxiety disorder) 04/13/2010   PREMATURE VENTRICULAR CONTRACTIONS 02/06/2010    MIGRAINE HEADACHE 07/24/2007   GASTROESOPHAGEAL REFLUX DISEASE 07/24/2007  Immunization History  Administered Date(s) Administered   Influenza Split 11/24/2012   Influenza,inj,Quad PF,6+ Mos 08/20/2014, 12/02/2015, 08/20/2017, 06/22/2019, 07/07/2020, 09/13/2021   PFIZER(Purple Top)SARS-COV-2 Vaccination 01/02/2020, 02/02/2020, 10/10/2020   Tdap 09/09/2014   Zoster Recombinat (Shingrix) 09/13/2021    Conditions to be addressed/monitored:  Hypertension, Hyperlipidemia, Anxiety, Allergic Rhinitis, and Chronic pain  There are no care plans that you recently modified to display for this patient.     Medication Assistance: None required.  Patient affirms current coverage meets needs.  Patient's preferred pharmacy is:  [facility] [phone] - [place], [place] AT [place] [address] [place] [address] Phone: [phone] Fax: [phone]   Uses pill box? Yes Pt endorses 100% compliance  Care Plan and Follow Up Patient Decision:  Patient agrees to Care Plan and Follow-up.  Plan: Telephone follow up appointment with care management team member scheduled for:  3 months and The patient has been provided with contact information for the care management team and has been advised to call with any health related questions or concerns.   Tomasa Blase, PharmD Clinical Pharmacist, [place]   Chart prep = 8 minutes

## 2021-09-24 ENCOUNTER — Other Ambulatory Visit: Payer: Self-pay | Admitting: Internal Medicine

## 2021-09-24 DIAGNOSIS — J309 Allergic rhinitis, unspecified: Secondary | ICD-10-CM

## 2021-10-05 ENCOUNTER — Encounter: Payer: Self-pay | Admitting: Internal Medicine

## 2021-10-11 ENCOUNTER — Other Ambulatory Visit: Payer: Self-pay | Admitting: *Deleted

## 2021-10-11 DIAGNOSIS — Z87891 Personal history of nicotine dependence: Secondary | ICD-10-CM

## 2021-10-16 ENCOUNTER — Encounter: Payer: Self-pay | Admitting: Internal Medicine

## 2021-10-16 ENCOUNTER — Other Ambulatory Visit: Payer: Self-pay

## 2021-10-16 ENCOUNTER — Ambulatory Visit (INDEPENDENT_AMBULATORY_CARE_PROVIDER_SITE_OTHER): Payer: Medicare Other | Admitting: Internal Medicine

## 2021-10-16 VITALS — BP 142/86 | HR 80 | Temp 99.2°F | Resp 16 | Ht 70.5 in | Wt 181.0 lb

## 2021-10-16 DIAGNOSIS — I10 Essential (primary) hypertension: Secondary | ICD-10-CM | POA: Diagnosis not present

## 2021-10-16 DIAGNOSIS — J069 Acute upper respiratory infection, unspecified: Secondary | ICD-10-CM | POA: Diagnosis not present

## 2021-10-16 LAB — POC COVID19 BINAXNOW: SARS Coronavirus 2 Ag: NEGATIVE

## 2021-10-16 MED ORDER — HYDROCODONE BIT-HOMATROP MBR 5-1.5 MG/5ML PO SOLN
5.0000 mL | Freq: Three times a day (TID) | ORAL | 0 refills | Status: DC | PRN
Start: 2021-10-16 — End: 2021-12-19

## 2021-10-16 NOTE — Progress Notes (Signed)
Subjective:  Patient ID: [name], female    DOB: 1960/12/16  Age: 60 y.o. MRN: [id number]  CC: Hypertension and URI  This visit occurred during the SARS-CoV-2 public health emergency.  Safety protocols were in place, including screening questions prior to the visit, additional usage of staff PPE, and extensive cleaning of exam room while observing appropriate contact time as indicated for disinfecting solutions.    HPI [name] presents for f/up-  She is not taking amlodipine - says she was afraid to take it. She has had a few headaches but denies BV, CP, DOE, SOB, or edema.  She complains of a one week hx of nasal congestion, runny nose, and NP cough.   Outpatient Medications Prior to Visit  Medication Sig Dispense Refill   ALPRAZolam (XANAX) 0.5 MG tablet Take 0.5 tablets (0.25 mg total) by mouth at bedtime. 90 tablet 0   amLODipine (NORVASC) 5 MG tablet Take 1 tablet (5 mg total) by mouth daily. 90 tablet 1   fluticasone (FLONASE) 50 MCG/ACT nasal spray SHAKE LIQUID AND USE 2 SPRAYS IN EACH NOSTRIL DAILY 48 g 1   indapamide (LOZOL) 2.5 MG tablet TAKE 1 TABLET(2.5 MG) BY MOUTH DAILY 90 tablet 0   levocetirizine (XYZAL) 5 MG tablet TAKE 1 TABLET(5 MG) BY MOUTH EVERY EVENING 90 tablet 1   LINZESS 145 MCG CAPS capsule Take 1 capsule (145 mcg total) by mouth daily before breakfast. 90 each 1   potassium chloride SA (KLOR-CON M15) 15 MEQ tablet Take 1 tablet (15 mEq total) by mouth 2 (two) times daily. 180 tablet 0   pravastatin (PRAVACHOL) 10 MG tablet Take 1 tablet (10 mg total) by mouth daily. 90 tablet 1   tiZANidine (ZANAFLEX) 2 MG tablet Take 2 mg by mouth 3 (three) times daily as needed.     No facility-administered medications prior to visit.    ROS Review of Systems  Constitutional:  Negative for chills, diaphoresis, fatigue and fever.  HENT:  Positive for congestion, postnasal drip and rhinorrhea. Negative for sinus pressure, sore throat and trouble  swallowing.   Respiratory:  Positive for cough. Negative for chest tightness, shortness of breath and wheezing.   Cardiovascular:  Negative for chest pain, palpitations and leg swelling.  Gastrointestinal:  Negative for abdominal pain, diarrhea and nausea.  Genitourinary: Negative.  Negative for difficulty urinating.  Musculoskeletal: Negative.  Negative for arthralgias and myalgias.  Skin: Negative.   Neurological:  Positive for headaches. Negative for dizziness, weakness, light-headedness and numbness.  Hematological:  Negative for adenopathy. Does not bruise/bleed easily.  Psychiatric/Behavioral: Negative.     Objective:  BP (!) 142/86 (BP Location: Left Arm, Patient Position: Sitting)    Pulse 80    Temp 99.2 F (37.3 C) (Oral)    Resp 16    Ht 5' 10.5" (1.791 m)    Wt 181 lb (82.1 kg)    SpO2 99%    BMI 25.60 kg/m   BP Readings from Last 3 Encounters:  10/20/21 112/80  10/16/21 (!) 142/86  09/13/21 (!) 142/96    Wt Readings from Last 3 Encounters:  10/16/21 181 lb (82.1 kg)  09/13/21 186 lb (84.4 kg)  03/23/21 192 lb (87.1 kg)    Physical Exam Vitals reviewed.  HENT:     Nose: Nose normal.     Mouth/Throat:     Mouth: Mucous membranes are moist.  Eyes:     General: No scleral icterus.    Conjunctiva/sclera: Conjunctivae normal.  Cardiovascular:     Rate and Rhythm: Normal rate and regular rhythm.     Heart sounds: No murmur heard. Pulmonary:     Effort: Pulmonary effort is normal.     Breath sounds: No stridor. No wheezing, rhonchi or rales.  Abdominal:     General: Abdomen is flat.     Palpations: There is no mass.     Tenderness: There is no abdominal tenderness. There is no guarding.  Musculoskeletal:        General: No swelling.     Cervical back: Neck supple.  Lymphadenopathy:     Cervical: No cervical adenopathy.  Skin:    General: Skin is warm and dry.     Findings: No lesion or rash.  Neurological:     General: No focal deficit present.     Mental  Status: She is alert.  Psychiatric:        Mood and Affect: Mood normal.        Behavior: Behavior normal.    Lab Results  Component Value Date   WBC 5.1 09/13/2021   HGB 13.6 09/13/2021   HCT 41.7 09/13/2021   PLT 395.0 09/13/2021   GLUCOSE 98 09/13/2021   CHOL 150 09/13/2021   TRIG 71.0 09/13/2021   HDL 55.40 09/13/2021   LDLCALC 80 09/13/2021   ALT 16 09/13/2021   AST 18 09/13/2021   NA 138 09/13/2021   K 3.3 (L) 09/13/2021   CL 100 09/13/2021   CREATININE 0.78 09/13/2021   BUN 10 09/13/2021   CO2 29 09/13/2021   TSH 1.23 09/13/2021   HGBA1C 5.6 09/13/2021    MM 3D SCREEN BREAST BILATERAL  Result Date: 09/18/2021 CLINICAL DATA:  Screening. EXAM: DIGITAL SCREENING BILATERAL MAMMOGRAM WITH TOMOSYNTHESIS AND CAD TECHNIQUE: Bilateral screening digital craniocaudal and mediolateral oblique mammograms were obtained. Bilateral screening digital breast tomosynthesis was performed. The images were evaluated with computer-aided detection. COMPARISON:  Previous exam(s). ACR Breast Density Category b: There are scattered areas of fibroglandular density. FINDINGS: There are no findings suspicious for malignancy. IMPRESSION: No mammographic evidence of malignancy. A result letter of this screening mammogram will be mailed directly to the patient. RECOMMENDATION: Screening mammogram in one year. (Code:SM-B-01Y) BI-RADS CATEGORY  1: Negative. Electronically Signed   By: Audie Pinto M.D.   On: 09/18/2021 12:54    Assessment & Plan:   [name] was seen today for hypertension and uri.  Diagnoses and all orders for this visit:  Viral upper respiratory tract infection- She is negative for COVID. Will treat symptomatically. -     HYDROcodone bit-homatropine (HYCODAN) 5-1.5 MG/5ML syrup; Take 5 mLs by mouth every 8 (eight) hours as needed for cough. -     POC COVID-19  Essential hypertension- Her BP is not at goal. She will start taking amlodipine.   I am having [name] start  on HYDROcodone bit-homatropine. I am also having her maintain her tiZANidine, pravastatin, indapamide, fluticasone, ALPRAZolam, potassium chloride SA, amLODipine, Linzess, and levocetirizine.  Meds ordered this encounter  Medications   HYDROcodone bit-homatropine (HYCODAN) 5-1.5 MG/5ML syrup    Sig: Take 5 mLs by mouth every 8 (eight) hours as needed for cough.    Dispense:  120 mL    Refill:  0     Follow-up: Return if symptoms worsen or fail to improve.  Scarlette Calico, MD

## 2021-10-16 NOTE — Patient Instructions (Signed)

## 2021-10-20 ENCOUNTER — Other Ambulatory Visit: Payer: Self-pay

## 2021-10-20 ENCOUNTER — Ambulatory Visit
Admission: EM | Admit: 2021-10-20 | Discharge: 2021-10-20 | Disposition: A | Discharge status: Home / Self Care | Payer: Medicare Other | Attending: Physician Assistant | Admitting: Physician Assistant

## 2021-10-20 DIAGNOSIS — J322 Chronic ethmoidal sinusitis: Secondary | ICD-10-CM

## 2021-10-20 DIAGNOSIS — J069 Acute upper respiratory infection, unspecified: Secondary | ICD-10-CM | POA: Diagnosis not present

## 2021-10-20 LAB — POCT FASTING CBG KUC MANUAL ENTRY: POCT Glucose (KUC): 141 mg/dL — AB (ref 70–99)

## 2021-10-20 MED ORDER — ONDANSETRON 4 MG PO TBDP
4.0000 mg | ORAL_TABLET | Freq: Three times a day (TID) | ORAL | 0 refills | Status: DC | PRN
Start: 2021-10-20 — End: 2021-12-19

## 2021-10-20 MED ORDER — AZITHROMYCIN 250 MG PO TABS: ORAL_TABLET | ORAL | 0 refills | Status: AC

## 2021-10-20 MED ORDER — ONDANSETRON 4 MG PO TBDP
4.0000 mg | ORAL_TABLET | Freq: Once | ORAL | Status: AC
  Administered 2021-10-20: 15:00:00 4 mg via ORAL

## 2021-10-20 NOTE — ED Provider Notes (Signed)
[facility]    CSN: [id number] Arrival date & time: 10/20/21  1418      History   Chief Complaint Chief Complaint  Patient presents with   Facial Pain    Sinus infection    HPI [name] is a 60 y.o. female.   Pt complains of nausea from taking prednisone.  Pt reports she has had 4 days for a sinus infection.  Pt requesting antibiotics   The history is provided by the patient. No language interpreter was used.   Past Medical History:  Diagnosis Date   Depression    DJD (degenerative joint disease)    GERD (gastroesophageal reflux disease)    History of palpitations    Migraine headache    PVC's (premature ventricular contractions)    Originating from [facility]    Patient Active Problem List   Diagnosis Date Noted   Viral upper respiratory tract infection 10/16/2021   Tobacco abuse 09/15/2021   Chronic idiopathic constipation 09/15/2021   Chronic hyperglycemia 09/13/2021   Routine general medical examination at a health care facility 09/13/2021   Diuretic-induced hypokalemia 09/13/2021   DDD (degenerative disc disease), lumbar 03/07/2021   Screening for cervical cancer 07/07/2020   Essential hypertension 03/01/2020   Occipital neuralgia of right side 01/05/2020   Atherosclerosis of aorta ([place]) 03/31/2019   Dyslipidemia, goal LDL below 100 03/31/2019   Visit for screening mammogram 03/31/2019   Allergic rhinitis 02/22/2017   Overweight (BMI 25.0-29.9) 03/22/2016   GAD (generalized anxiety disorder) 04/13/2010   PREMATURE VENTRICULAR CONTRACTIONS 02/06/2010   MIGRAINE HEADACHE 07/24/2007   GASTROESOPHAGEAL REFLUX DISEASE 07/24/2007    Past Surgical History:  Procedure Laterality Date   BACK SURGERY  10/23/12   CERVICAL FUSION     CHOLECYSTECTOMY     LAMINECTOMY     MRI     every 3 mths /due to menigitis   TOTAL ABDOMINAL HYSTERECTOMY     TUBAL LIGATION      OB History   No obstetric history on file.      Home Medications     Prior to Admission medications   Medication Sig Start Date End Date Taking? Authorizing Provider  azithromycin (ZITHROMAX Z-PAK) 250 MG tablet Take 2 tablets (500 mg) on  Day 1,  followed by 1 tablet (250 mg) once daily on Days 2 through 5. 10/20/21 10/25/21 Yes Caryl Ada K, PA-C  ondansetron (ZOFRAN-ODT) 4 MG disintegrating tablet Take 1 tablet (4 mg total) by mouth every 8 (eight) hours as needed for nausea or vomiting. 10/20/21  Yes Caryl Ada K, PA-C  ALPRAZolam Duanne Moron) 0.5 MG tablet Take 0.5 tablets (0.25 mg total) by mouth at bedtime. 08/30/21   Janith Lima, MD  amLODipine (NORVASC) 5 MG tablet Take 1 tablet (5 mg total) by mouth daily. 09/15/21   Janith Lima, MD  fluticasone (FLONASE) 50 MCG/ACT nasal spray SHAKE LIQUID AND USE 2 SPRAYS IN [facility] NOSTRIL DAILY 08/05/21   Janith Lima, MD  HYDROcodone bit-homatropine (HYCODAN) 5-1.5 MG/5ML syrup Take 5 mLs by mouth every 8 (eight) hours as needed for cough. 10/16/21   Janith Lima, MD  indapamide (LOZOL) 2.5 MG tablet TAKE 1 TABLET(2.5 MG) BY MOUTH DAILY 06/27/21   Janith Lima, MD  levocetirizine (XYZAL) 5 MG tablet TAKE 1 TABLET(5 MG) BY MOUTH EVERY EVENING 09/24/21   Janith Lima, MD  LINZESS 145 MCG CAPS capsule Take 1 capsule (145 mcg total) by mouth daily before breakfast. 09/15/21  Janith Lima, MD  potassium chloride SA (KLOR-CON M15) 15 MEQ tablet Take 1 tablet (15 mEq total) by mouth 2 (two) times daily. 09/13/21   Janith Lima, MD  pravastatin (PRAVACHOL) 10 MG tablet Take 1 tablet (10 mg total) by mouth daily. 06/12/21   Janith Lima, MD  tiZANidine (ZANAFLEX) 2 MG tablet Take 2 mg by mouth 3 (three) times daily as needed. 01/11/21   [provider]    Family History Family History  Problem Relation Age of Onset   Diabetes Mother    Multiple sclerosis Mother    Heart disease Father    Hypertension Sister    Kidney disease Sister    Colon cancer Neg Hx     Social History Social  History   Tobacco Use   Smoking status: Every Day    Packs/day: 1.00    Years: 36.00    Pack years: 36.00    Types: Cigarettes    Last attempt to quit: 09/2019    Years since quitting: 2.0   Smokeless tobacco: Never  Vaping Use   Vaping Use: Never used  Substance Use Topics   Alcohol use: No   Drug use: No     Allergies   Livalo [pitavastatin], Anesthesia s-i-40 [propofol], Augmentin [amoxicillin-pot clavulanate], and Doxycycline   Review of Systems Review of Systems  All other systems reviewed and are negative.   Physical Exam Triage Vital Signs ED Triage Vitals  Enc Vitals Group     BP 10/20/21 1507 112/80     Pulse Rate 10/20/21 1507 75     Resp 10/20/21 1507 16     Temp 10/20/21 1507 (!) 96.2 F (35.7 C)     Temp Source 10/20/21 1507 Oral     SpO2 10/20/21 1507 97 %     Weight --      Height --      Head Circumference --      Peak Flow --      Pain Score 10/20/21 1509 5     Pain Loc --      Pain Edu? --      Excl. in [place]? --    No data found.  Updated Vital Signs BP 112/80 (BP Location: Right Arm)    Pulse 75    Temp (!) 96.2 F (35.7 C) (Oral)    Resp 16    SpO2 97%   Visual Acuity Right Eye Distance:   Left Eye Distance:   Bilateral Distance:    Right Eye Near:   Left Eye Near:    Bilateral Near:     Physical Exam Vitals and nursing note reviewed.  Constitutional:      Appearance: She is well-developed.  HENT:     Head: Normocephalic.     Mouth/Throat:     Mouth: Mucous membranes are moist.  Eyes:     Pupils: Pupils are equal, round, and reactive to light.  Cardiovascular:     Rate and Rhythm: Normal rate.  Pulmonary:     Effort: Pulmonary effort is normal.  Abdominal:     General: Abdomen is flat. There is no distension.  Musculoskeletal:        General: Normal range of motion.     Cervical back: Normal range of motion.  Neurological:     Mental Status: She is alert and oriented to person, place, and time.  Psychiatric:         Mood and Affect: Mood normal.  UC Treatments / Results  Labs (all labs ordered are listed, but only abnormal results are displayed) Labs Reviewed  POCT FASTING CBG [facility] MANUAL ENTRY - Abnormal; Notable for the following components:      Result Value   POCT Glucose ([facility]) 141 (*)    All other components within normal limits  COVID-19, FLU A+B NAA    EKG   Radiology No results found.  Procedures Procedures (including critical care time)  Medications Ordered in UC Medications  ondansetron (ZOFRAN-ODT) disintegrating tablet 4 mg (4 mg Oral Given 10/20/21 1511)    Initial Impression / Assessment and Plan / UC Course  I have reviewed the triage vital signs and the nursing notes.  Pertinent labs & imaging results that were available during my care of the patient were reviewed by me and considered in my medical decision making (see chart for details).      Final Clinical Impressions(s) / UC Diagnoses   Final diagnoses:  Viral upper respiratory tract infection  Ethmoid sinusitis, unspecified chronicity     Discharge Instructions      Return if any problems.    ED Prescriptions     Medication Sig Dispense Auth. Provider   azithromycin (ZITHROMAX Z-PAK) 250 MG tablet Take 2 tablets (500 mg) on  Day 1,  followed by 1 tablet (250 mg) once daily on Days 2 through 5. 6 each Adhvik Canady K, PA-C   ondansetron (ZOFRAN-ODT) 4 MG disintegrating tablet Take 1 tablet (4 mg total) by mouth every 8 (eight) hours as needed for nausea or vomiting. 10 tablet Fransico Meadow, [place]      PDMP not reviewed this encounter.   Fransico Meadow, [place] 10/20/21 [id number]

## 2021-10-20 NOTE — ED Triage Notes (Signed)
Patient states she went to her PCP this past Monday and was given Methylprednisolone and she is at her last two days of meds but she states she is not getting any relief.   Patient states she has nausea today for the 1st time and she has some chills today.  She states she has a lot of pressure in her head  Denies Fever

## 2021-10-20 NOTE — Discharge Instructions (Signed)
Return if any problems.

## 2021-10-22 LAB — COVID-19, FLU A+B NAA
Influenza A, NAA: NOT DETECTED
Influenza B, NAA: NOT DETECTED
SARS-CoV-2, NAA: DETECTED — AB

## 2021-10-25 ENCOUNTER — Inpatient Hospital Stay: Admission: RE | Admit: 2021-10-25 | Payer: Medicare Other | Source: Ambulatory Visit

## 2021-11-21 ENCOUNTER — Ambulatory Visit: Payer: Medicare Other | Admitting: Obstetrics

## 2021-11-30 ENCOUNTER — Inpatient Hospital Stay: Admission: RE | Admit: 2021-11-30 | Payer: Medicare Other | Source: Ambulatory Visit

## 2021-12-11 ENCOUNTER — Ambulatory Visit: Payer: Medicare Other | Admitting: Internal Medicine

## 2021-12-11 ENCOUNTER — Other Ambulatory Visit: Payer: Self-pay | Admitting: Internal Medicine

## 2021-12-11 DIAGNOSIS — F411 Generalized anxiety disorder: Secondary | ICD-10-CM

## 2021-12-12 MED ORDER — ALPRAZOLAM 0.5 MG PO TABS: 0.2500 mg | ORAL_TABLET | Freq: Every day | ORAL | 0 refills | Status: DC

## 2021-12-19 ENCOUNTER — Encounter: Payer: Self-pay | Admitting: Internal Medicine

## 2021-12-19 ENCOUNTER — Other Ambulatory Visit: Payer: Self-pay

## 2021-12-19 ENCOUNTER — Ambulatory Visit (INDEPENDENT_AMBULATORY_CARE_PROVIDER_SITE_OTHER): Payer: Medicare Other

## 2021-12-19 ENCOUNTER — Ambulatory Visit (INDEPENDENT_AMBULATORY_CARE_PROVIDER_SITE_OTHER): Payer: Medicare Other | Admitting: Internal Medicine

## 2021-12-19 VITALS — BP 120/70 | HR 77 | Temp 98.9°F | Resp 16 | Ht 67.0 in | Wt 167.6 lb

## 2021-12-19 VITALS — BP 138/84 | HR 77 | Temp 98.9°F | Resp 16 | Ht 67.0 in | Wt 167.0 lb

## 2021-12-19 DIAGNOSIS — Z0000 Encounter for general adult medical examination without abnormal findings: Secondary | ICD-10-CM | POA: Diagnosis not present

## 2021-12-19 DIAGNOSIS — I10 Essential (primary) hypertension: Secondary | ICD-10-CM

## 2021-12-19 DIAGNOSIS — J309 Allergic rhinitis, unspecified: Secondary | ICD-10-CM

## 2021-12-19 DIAGNOSIS — Z23 Encounter for immunization: Secondary | ICD-10-CM

## 2021-12-19 DIAGNOSIS — M659 Synovitis and tenosynovitis, unspecified: Secondary | ICD-10-CM

## 2021-12-19 DIAGNOSIS — Z720 Tobacco use: Secondary | ICD-10-CM

## 2021-12-19 DIAGNOSIS — E876 Hypokalemia: Secondary | ICD-10-CM

## 2021-12-19 DIAGNOSIS — M65949 Unspecified synovitis and tenosynovitis, unspecified hand: Secondary | ICD-10-CM

## 2021-12-19 DIAGNOSIS — T502X5A Adverse effect of carbonic-anhydrase inhibitors, benzothiadiazides and other diuretics, initial encounter: Secondary | ICD-10-CM

## 2021-12-19 LAB — BASIC METABOLIC PANEL
BUN: 9 mg/dL (ref 6–23)
CO2: 31 mEq/L (ref 19–32)
Calcium: 10.1 mg/dL (ref 8.4–10.5)
Chloride: 99 mEq/L (ref 96–112)
Creatinine, Ser: 0.7 mg/dL (ref 0.40–1.20)
GFR: 93.81 mL/min (ref >60.00)
Glucose, Bld: 90 mg/dL (ref 70–99)
Potassium: 3.5 mEq/L (ref 3.5–5.1)
Sodium: 136 mEq/L (ref 135–145)

## 2021-12-19 LAB — MAGNESIUM: Magnesium: 2 mg/dL (ref 1.5–2.5)

## 2021-12-19 MED ORDER — SHINGRIX 50 MCG/0.5ML IM SUSR: 0.5000 mL | Freq: Once | INTRAMUSCULAR | 0 refills | Status: AC

## 2021-12-19 MED ORDER — FLUTICASONE PROPIONATE 50 MCG/ACT NA SUSP: NASAL | 1 refills | Status: DC

## 2021-12-19 NOTE — Patient Instructions (Signed)

## 2021-12-19 NOTE — Progress Notes (Signed)
Subjective:   [name] is a 61 y.o. female who presents for Medicare Annual (Subsequent) preventive examination.  Review of Systems     Cardiac Risk Factors include: family history of premature cardiovascular disease;hypertension;dyslipidemia     Objective:    Today's Vitals   12/19/21 1304  BP: 120/70  Pulse: 77  Resp: 16  Temp: 98.9 F (37.2 C)  TempSrc: Oral  SpO2: 99%  Weight: 167 lb 9.6 oz (76 kg)  Height: 5\' 7"  (1.702 m)  PainSc: 0-No pain   Body mass index is 26.25 kg/m.  Advanced Directives 12/19/2021 12/15/2020 08/20/2017 02/21/2017 07/26/2016 07/12/2016 01/19/2015  Does Patient Have a Medical Advance Directive? No No No No No No No  Would patient like information on creating a medical advance directive? No - Patient declined Yes (MAU/Ambulatory/Procedural Areas - Information given) - - No - patient declined information No - patient declined information No - patient declined information    Current Medications (verified) Outpatient Encounter Medications as of 12/19/2021  Medication Sig   ALPRAZolam (XANAX) 0.5 MG tablet Take 0.5 tablets (0.25 mg total) by mouth at bedtime.   amLODipine (NORVASC) 5 MG tablet Take 1 tablet (5 mg total) by mouth daily.   fluticasone (FLONASE) 50 MCG/ACT nasal spray SHAKE LIQUID AND USE 2 SPRAYS IN EACH NOSTRIL DAILY   HYDROcodone bit-homatropine (HYCODAN) 5-1.5 MG/5ML syrup Take 5 mLs by mouth every 8 (eight) hours as needed for cough.   indapamide (LOZOL) 2.5 MG tablet TAKE 1 TABLET(2.5 MG) BY MOUTH DAILY   levocetirizine (XYZAL) 5 MG tablet TAKE 1 TABLET(5 MG) BY MOUTH EVERY EVENING   LINZESS 145 MCG CAPS capsule Take 1 capsule (145 mcg total) by mouth daily before breakfast.   ondansetron (ZOFRAN-ODT) 4 MG disintegrating tablet Take 1 tablet (4 mg total) by mouth every 8 (eight) hours as needed for nausea or vomiting.   potassium chloride SA (KLOR-CON M15) 15 MEQ tablet Take 1 tablet (15 mEq total) by mouth 2 (two) times daily.    pravastatin (PRAVACHOL) 10 MG tablet Take 1 tablet (10 mg total) by mouth daily.   tiZANidine (ZANAFLEX) 2 MG tablet Take 2 mg by mouth 3 (three) times daily as needed.   No facility-administered encounter medications on file as of 12/19/2021.    Allergies (verified) Livalo [pitavastatin], Anesthesia s-i-40 [propofol], Augmentin [amoxicillin-pot clavulanate], and Doxycycline   History: Past Medical History:  Diagnosis Date   Depression    DJD (degenerative joint disease)    GERD (gastroesophageal reflux disease)    History of palpitations    Migraine headache    PVC's (premature ventricular contractions)    Originating from [facility]   Past Surgical History:  Procedure Laterality Date   BACK SURGERY  10/23/12   CERVICAL FUSION     CHOLECYSTECTOMY     LAMINECTOMY     MRI     every 3 mths /due to menigitis   TOTAL ABDOMINAL HYSTERECTOMY     TUBAL LIGATION     Family History  Problem Relation Age of Onset   Diabetes Mother    Multiple sclerosis Mother    Heart disease Father    Hypertension Sister    Kidney disease Sister    Colon cancer Neg Hx    Social History   Socioeconomic History   Marital status: Married    Spouse name: Not on file   Number of children: 2   Years of education: 12   Highest education level: Not on file  Occupational  History   Occupation: Retired/disabled  Tobacco Use   Smoking status: Every Day    Packs/day: 1.00    Years: 36.00    Pack years: 36.00    Types: Cigarettes    Last attempt to quit: 09/2019    Years since quitting: 2.2   Smokeless tobacco: Never  Vaping Use   Vaping Use: Never used  Substance and Sexual Activity   Alcohol use: No   Drug use: No   Sexual activity: Yes    Partners: Male  Other Topics Concern   Not on file  Social History Narrative   Occupation: retired [employer]   Married   Alcohol no   Drug use no, no caffiene   Tobacco use- yes quit Dec '08   Regular Exercise no   4  children; reports increased stress in due to caring for grandchildren   UNEMPLOYED      Lives at home w/ her husband   Right-handed   Caffeine: 1 cup of coffee    Social Determinants of Health   Financial Resource Strain: Low Risk    Difficulty of Paying Living Expenses: Not hard at all  Food Insecurity: No Food Insecurity   Worried About Charity fundraiser in the Last Year: Never true   [place] in the Last Year: Never true  Transportation Needs: No Transportation Needs   Lack of Transportation (Medical): No   Lack of Transportation (Non-Medical): No  Physical Activity: Inactive   Days of Exercise per Week: 0 days   Minutes of Exercise per Session: 0 min  Stress: No Stress Concern Present   Feeling of Stress : Not at all  Social Connections: Moderately Integrated   Frequency of Communication with Friends and Family: More than three times a week   Frequency of Social Gatherings with Friends and Family: More than three times a week   Attends Religious Services: More than 4 times per year   Active Member of [employer] or Organizations: Yes   Attends Archivist Meetings: More than 4 times per year   Marital Status: Widowed    Tobacco Counseling Ready to quit: Not Answered Counseling given: Not Answered   Clinical Intake:  Pre-visit preparation completed: Yes  Pain : No/denies pain Pain Score: 0-No pain     BMI - recorded: 26.25 Nutritional Status: BMI 25 -29 Overweight Nutritional Risks: None Diabetes: No  How often do you need to have someone help you when you read instructions, pamphlets, or other written materials from your doctor or pharmacy?: 1 - Never What is the last grade level you completed in school?: Retired [employer] Department  Diabetic? no  Interpreter Needed?: No  Information entered by :: Lisette Abu, LPN   Activities of Daily Living In your present state of health, do you have any difficulty performing the following  activities: 12/19/2021  Hearing? N  Vision? N  Difficulty concentrating or making decisions? N  Walking or climbing stairs? N  Dressing or bathing? N  Doing errands, shopping? N  Preparing Food and eating ? N  Using the Toilet? N  In the past six months, have you accidently leaked urine? N  Do you have problems with loss of bowel control? N  Managing your Medications? N  Managing your Finances? N  Housekeeping or managing your Housekeeping? N  Some recent data might be hidden    Patient Care Team: Janith Lima, MD as PCP - General (Internal Medicine) Domenic Polite,  Aloha Gell, MD as PCP - Cardiology (Cardiology) Szabat, Darnelle Maffucci, [facility] as Pharmacist (Pharmacist)  Indicate any recent Medical Services you may have received from other than [facility] providers in the past year (date may be approximate).     Assessment:   This is a routine wellness examination for [facility].  Hearing/Vision screen Hearing Screening - Comments:: Patient denied any hearing difficulties. No hearing aids. Vision Screening - Comments:: Patient wears glasses.   Annual eye exam done by: [facility] in [place], [place]  Dietary issues and exercise activities discussed: Current Exercise Habits: The patient does not participate in regular exercise at present, Exercise limited by: orthopedic condition(s)   Goals Addressed             This Visit's Progress    Patient Stated       My goal is to start back walking again.      Depression Screen PHQ 2/9 Scores 12/19/2021 02/14/2021 12/15/2020 10/06/2020 06/22/2019 03/22/2016  PHQ - 2 Score 0 0 0 0 0 0    Fall Risk Fall Risk  12/19/2021 02/14/2021 12/15/2020 06/22/2019 03/22/2016  Falls in the past year? 0 0 0 0 No  Number falls in past yr: 0 0 0 0 -  Injury with Fall? 0 0 0 0 -  Risk for fall due to : No Fall Risks No Fall Risks No Fall Risks - -  Follow up Falls evaluation completed Falls evaluation completed - Falls evaluation completed -    FALL RISK PREVENTION  PERTAINING TO THE HOME:  Any stairs in or around the home? No  If so, are there any without handrails? No  Home free of loose throw rugs in walkways, pet beds, electrical cords, etc? Yes  Adequate lighting in your home to reduce risk of falls? Yes   ASSISTIVE DEVICES UTILIZED TO PREVENT FALLS:  Life alert? No  Use of a cane, walker or w/c? No  Grab bars in the bathroom? No  Shower chair or bench in shower? Yes  Elevated toilet seat or a handicapped toilet? Yes   TIMED UP AND GO:  Was the test performed? Yes .  Length of time to ambulate 10 feet: 6 sec.   Gait steady and fast without use of assistive device  Cognitive Function: Normal cognitive status assessed by direct observation by this Nurse Health Advisor. No abnormalities found.       6CIT Screen 12/19/2021  What Year? 0 points  What month? 0 points  What time? 0 points  Count back from 20 0 points  Months in reverse 0 points  Repeat phrase 0 points  Total Score 0    Immunizations Immunization History  Administered Date(s) Administered   Influenza Split 11/24/2012   Influenza,inj,Quad PF,6+ Mos 08/20/2014, 12/02/2015, 08/20/2017, 06/22/2019, 07/07/2020, 09/13/2021   PFIZER(Purple Top)SARS-COV-2 Vaccination 01/02/2020, 02/02/2020, 10/10/2020   Tdap 09/09/2014   Zoster Recombinat (Shingrix) 09/13/2021    TDAP status: Up to date  Flu Vaccine status: Up to date  Pneumococcal vaccine status: Due, Education has been provided regarding the importance of this vaccine. Advised may receive this vaccine at local pharmacy or Health Dept. Aware to provide a copy of the vaccination record if obtained from local pharmacy or Health Dept. Verbalized acceptance and understanding.  Covid-19 vaccine status: Completed vaccines  Qualifies for Shingles Vaccine? Yes   Zostavax completed No   Shingrix Completed?: No.    Education has been provided regarding the importance of this vaccine. Patient has been advised to call insurance  company to determine out of pocket expense if they have not yet received this vaccine. Advised may also receive vaccine at local pharmacy or Health Dept. Verbalized acceptance and understanding.  Screening Tests Health Maintenance  Topic Date Due   COVID-19 Vaccine (4 - Booster for Pfizer series) 12/05/2020   Zoster Vaccines- Shingrix (2 of 2) 11/08/2021   COLONOSCOPY (Pts 45-60yrs Insurance coverage will need to be confirmed)  09/02/2022   MAMMOGRAM  09/19/2023   TETANUS/TDAP  09/09/2024   INFLUENZA VACCINE  Completed   Hepatitis C Screening  Completed   HIV Screening  Completed   HPV VACCINES  Aged Out    Health Maintenance  Health Maintenance Due  Topic Date Due   COVID-19 Vaccine (4 - Booster for [place] series) 12/05/2020   Zoster Vaccines- Shingrix (2 of 2) 11/08/2021    Colorectal cancer screening: Type of screening: Colonoscopy. Completed 09/02/2017. Repeat every 5 years  Mammogram status: Completed 09/18/2021. Repeat every year  Bone Density Status: never done  Lung Cancer Screening: (Low Dose CT Chest recommended if Age 38-80 years, 30 pack-year currently smoking OR have quit w/in 15years.) does not qualify.   Lung Cancer Screening Referral: no  Additional Screening:  Hepatitis C Screening: does qualify; Completed yes  Vision Screening: Recommended annual ophthalmology exams for early detection of glaucoma and other disorders of the eye. Is the patient up to date with their annual eye exam?  Yes  Who is the provider or what is the name of the office in which the patient attends annual eye exams? [facility] If pt is not established with a provider, would they like to be referred to a provider to establish care? No .   Dental Screening: Recommended annual dental exams for proper oral hygiene  Community Resource Referral / Chronic Care Management: CRR required this visit?  No   CCM required this visit?  No      Plan:     I have personally reviewed and  noted the following in the patients chart:   Medical and social history Use of alcohol, tobacco or illicit drugs  Current medications and supplements including opioid prescriptions.  Functional ability and status Nutritional status Physical activity Advanced directives List of other physicians Hospitalizations, surgeries, and ER visits in previous 12 months Vitals Screenings to include cognitive, depression, and falls Referrals and appointments  In addition, I have reviewed and discussed with patient certain preventive protocols, quality metrics, and best practice recommendations. A written personalized care plan for preventive services as well as general preventive health recommendations were provided to patient.     Sheral Flow, LPN   0/25/4270   Nurse Notes:  Hearing Screening - Comments:: Patient denied any hearing difficulties. No hearing aids. Vision Screening - Comments:: Patient wears glasses.   Annual eye exam done by: [facility] in [place], [place]

## 2021-12-19 NOTE — Patient Instructions (Signed)
[name] , Thank you for taking time to come for your Medicare Wellness Visit. I appreciate your ongoing commitment to your health goals. Please review the following plan we discussed and let me know if I can assist you in the future.   Screening recommendations/referrals: Colonoscopy: 09/02/2017; due every 5 years (due 09/02/2022) Mammogram: 09/18/2021; due every year (due 09/18/2022) Bone Density: never done/ordered Recommended yearly ophthalmology/optometry visit for glaucoma screening and checkup Recommended yearly dental visit for hygiene and checkup  Vaccinations: Influenza vaccine: 09/13/2021 Pneumococcal vaccine: never done; recommended Prevnar20 Tdap vaccine: 09/09/2014; due every 10 years (due 09/09/2024) Shingles vaccine: 09/13/2021; need second dose of Shingrix Covid-19: 01/02/2020, 02/02/2020, 10/10/2020  Advanced directives: Advance directive discussed with you today. Even though you declined this today please call our office should you change your mind and we can give you the proper paperwork for you to fill out.  Conditions/risks identified: Reviewed health maintenance screenings with patient today and relevant education, vaccines, and/or referrals were provided.    Continue to eat heart healthy diet (full of fruits, vegetables, whole grains, lean protein, water--limit salt, fat, and sugar intake) and increase physical activity as tolerated.   Continue doing brain stimulating activities (puzzles, reading, adult coloring books, staying active) to keep memory sharp.     Next appointment: Please schedule your next Medicare Wellness Visit with your Nurse Health Advisor in 1 year by calling [phone].  Preventive Care 40-64 Years, Female Preventive care refers to lifestyle choices and visits with your health care provider that can promote health and wellness. What does preventive care include? A yearly physical exam. This is also called an annual well check. Dental exams  once or twice a year. Routine eye exams. Ask your health care provider how often you should have your eyes checked. Personal lifestyle choices, including: Daily care of your teeth and gums. Regular physical activity. Eating a healthy diet. Avoiding tobacco and drug use. Limiting alcohol use. Practicing safe sex. Taking low-dose aspirin daily starting at age 14. Taking vitamin and mineral supplements as recommended by your health care provider. What happens during an annual well check? The services and screenings done by your health care provider during your annual well check will depend on your age, overall health, lifestyle risk factors, and family history of disease. Counseling  Your health care provider may ask you questions about your: Alcohol use. Tobacco use. Drug use. Emotional well-being. Home and relationship well-being. Sexual activity. Eating habits. Work and work Statistician. Method of birth control. Menstrual cycle. Pregnancy history. Screening  You may have the following tests or measurements: Height, weight, and BMI. Blood pressure. Lipid and cholesterol levels. These may be checked every 5 years, or more frequently if you are over 7 years old. Skin check. Lung cancer screening. You may have this screening every year starting at age 80 if you have a 30-pack-year history of smoking and currently smoke or have quit within the past 15 years. Fecal occult blood test (FOBT) of the stool. You may have this test every year starting at age 72. Flexible sigmoidoscopy or colonoscopy. You may have a sigmoidoscopy every 5 years or a colonoscopy every 10 years starting at age 25. Hepatitis C blood test. Hepatitis B blood test. Sexually transmitted disease (STD) testing. Diabetes screening. This is done by checking your blood sugar (glucose) after you have not eaten for a while (fasting). You may have this done every 1-3 years. Mammogram. This may be done every 1-2 years.  Talk to your health  care provider about when you should start having regular mammograms. This may depend on whether you have a family history of breast cancer. BRCA-related cancer screening. This may be done if you have a family history of breast, ovarian, tubal, or peritoneal cancers. Pelvic exam and Pap test. This may be done every 3 years starting at age 23. Starting at age 78, this may be done every 5 years if you have a Pap test in combination with an HPV test. Bone density scan. This is done to screen for osteoporosis. You may have this scan if you are at high risk for osteoporosis. Discuss your test results, treatment options, and if necessary, the need for more tests with your health care provider. Vaccines  Your health care provider may recommend certain vaccines, such as: Influenza vaccine. This is recommended every year. Tetanus, diphtheria, and acellular pertussis (Tdap, Td) vaccine. You may need a Td booster every 10 years. Zoster vaccine. You may need this after age 75. Pneumococcal 13-valent conjugate (PCV13) vaccine. You may need this if you have certain conditions and were not previously vaccinated. Pneumococcal polysaccharide (PPSV23) vaccine. You may need one or two doses if you smoke cigarettes or if you have certain conditions. Talk to your health care provider about which screenings and vaccines you need and how often you need them. This information is not intended to replace advice given to you by your health care provider. Make sure you discuss any questions you have with your health care provider. Document Released: 11/11/2015 Document Revised: 07/04/2016 Document Reviewed: 08/16/2015 Elsevier Interactive Patient Education  2017 [place] Prevention in the Home Falls can cause injuries. They can happen to people of all ages. There are many things you can do to make your home safe and to help prevent falls. What can I do on the outside of my home? Regularly  fix the edges of walkways and driveways and fix any cracks. Remove anything that might make you trip as you walk through a door, such as a raised step or threshold. Trim any bushes or trees on the path to your home. Use bright outdoor lighting. Clear any walking paths of anything that might make someone trip, such as rocks or tools. Regularly check to see if handrails are loose or broken. Make sure that both sides of any steps have handrails. Any raised decks and porches should have guardrails on the edges. Have any leaves, snow, or ice cleared regularly. Use sand or salt on walking paths during winter. Clean up any spills in your garage right away. This includes oil or grease spills. What can I do in the bathroom? Use night lights. Install grab bars by the toilet and in the tub and shower. Do not use towel bars as grab bars. Use non-skid mats or decals in the tub or shower. If you need to sit down in the shower, use a plastic, non-slip stool. Keep the floor dry. Clean up any water that spills on the floor as soon as it happens. Remove soap buildup in the tub or shower regularly. Attach bath mats securely with double-sided non-slip rug tape. Do not have throw rugs and other things on the floor that can make you trip. What can I do in the bedroom? Use night lights. Make sure that you have a light by your bed that is easy to reach. Do not use any sheets or blankets that are too big for your bed. They should not hang down onto the  floor. Have a firm chair that has side arms. You can use this for support while you get dressed. Do not have throw rugs and other things on the floor that can make you trip. What can I do in the kitchen? Clean up any spills right away. Avoid walking on wet floors. Keep items that you use a lot in easy-to-reach places. If you need to reach something above you, use a strong step stool that has a grab bar. Keep electrical cords out of the way. Do not use floor  polish or wax that makes floors slippery. If you must use wax, use non-skid floor wax. Do not have throw rugs and other things on the floor that can make you trip. What can I do with my stairs? Do not leave any items on the stairs. Make sure that there are handrails on both sides of the stairs and use them. Fix handrails that are broken or loose. Make sure that handrails are as long as the stairways. Check any carpeting to make sure that it is firmly attached to the stairs. Fix any carpet that is loose or worn. Avoid having throw rugs at the top or bottom of the stairs. If you do have throw rugs, attach them to the floor with carpet tape. Make sure that you have a light switch at the top of the stairs and the bottom of the stairs. If you do not have them, ask someone to add them for you. What else can I do to help prevent falls? Wear shoes that: Do not have high heels. Have rubber bottoms. Are comfortable and fit you well. Are closed at the toe. Do not wear sandals. If you use a stepladder: Make sure that it is fully opened. Do not climb a closed stepladder. Make sure that both sides of the stepladder are locked into place. Ask someone to hold it for you, if possible. Clearly mark and make sure that you can see: Any grab bars or handrails. First and last steps. Where the edge of each step is. Use tools that help you move around (mobility aids) if they are needed. These include: Canes. Walkers. Scooters. Crutches. Turn on the lights when you go into a dark area. Replace any light bulbs as soon as they burn out. Set up your furniture so you have a clear path. Avoid moving your furniture around. If any of your floors are uneven, fix them. If there are any pets around you, be aware of where they are. Review your medicines with your doctor. Some medicines can make you feel dizzy. This can increase your chance of falling. Ask your doctor what other things that you can do to help prevent  falls. This information is not intended to replace advice given to you by your health care provider. Make sure you discuss any questions you have with your health care provider. Document Released: 08/11/2009 Document Revised: 03/22/2016 Document Reviewed: 11/19/2014 Elsevier Interactive Patient Education  2017 [employer].

## 2021-12-19 NOTE — Progress Notes (Signed)
Subjective:  Patient ID: [name], female    DOB: December 04, 1960  Age: 61 y.o. MRN: [id number]  CC: Hypertension  This visit occurred during the SARS-CoV-2 public health emergency.  Safety protocols were in place, including screening questions prior to the visit, additional usage of staff PPE, and extensive cleaning of exam room while observing appropriate contact time as indicated for disinfecting solutions.    HPI [name] presents for f/up -  She has intentionally lost weight with lifestyle modifications.  She is active and denies chest pain, shortness of breath, or diaphoresis.  Outpatient Medications Prior to Visit  Medication Sig Dispense Refill   ALPRAZolam (XANAX) 0.5 MG tablet Take 0.5 tablets (0.25 mg total) by mouth at bedtime. 90 tablet 0   amLODipine (NORVASC) 5 MG tablet Take 1 tablet (5 mg total) by mouth daily. 90 tablet 1   indapamide (LOZOL) 2.5 MG tablet TAKE 1 TABLET(2.5 MG) BY MOUTH DAILY 90 tablet 0   levocetirizine (XYZAL) 5 MG tablet TAKE 1 TABLET(5 MG) BY MOUTH EVERY EVENING 90 tablet 1   LINZESS 145 MCG CAPS capsule Take 1 capsule (145 mcg total) by mouth daily before breakfast. 90 each 1   potassium chloride SA (KLOR-CON M15) 15 MEQ tablet Take 1 tablet (15 mEq total) by mouth 2 (two) times daily. 180 tablet 0   pravastatin (PRAVACHOL) 10 MG tablet Take 1 tablet (10 mg total) by mouth daily. 90 tablet 1   tiZANidine (ZANAFLEX) 2 MG tablet Take 2 mg by mouth 3 (three) times daily as needed.     fluticasone (FLONASE) 50 MCG/ACT nasal spray SHAKE LIQUID AND USE 2 SPRAYS IN EACH NOSTRIL DAILY 48 g 1   HYDROcodone bit-homatropine (HYCODAN) 5-1.5 MG/5ML syrup Take 5 mLs by mouth every 8 (eight) hours as needed for cough. 120 mL 0   ondansetron (ZOFRAN-ODT) 4 MG disintegrating tablet Take 1 tablet (4 mg total) by mouth every 8 (eight) hours as needed for nausea or vomiting. 10 tablet 0   No facility-administered medications prior to visit.    ROS Review  of Systems  Constitutional:  Negative for diaphoresis and fatigue.  HENT:  Positive for congestion, postnasal drip and rhinorrhea. Negative for sinus pressure, sore throat, tinnitus and voice change.   Respiratory: Negative.  Negative for cough, chest tightness, shortness of breath and wheezing.   Cardiovascular:  Negative for chest pain, palpitations and leg swelling.  Gastrointestinal: Negative.  Negative for abdominal pain, constipation, diarrhea, nausea and vomiting.  Genitourinary: Negative.  Negative for difficulty urinating.  Musculoskeletal:  Positive for arthralgias.       Right thumb pain for 3 weeks  Neurological:  Negative for dizziness, weakness and light-headedness.  Hematological:  Negative for adenopathy. Does not bruise/bleed easily.  Psychiatric/Behavioral: Negative.     Objective:  BP 138/84 (BP Location: Left Arm, Patient Position: Sitting, Cuff Size: Large)    Pulse 77    Temp 98.9 F (37.2 C) (Oral)    Resp 16    Ht 5\' 7"  (1.702 m)    Wt 167 lb (75.8 kg)    SpO2 99%    PF 99 L/min    BMI 26.16 kg/m   BP Readings from Last 3 Encounters:  12/19/21 138/84  12/19/21 120/70  10/20/21 112/80    Wt Readings from Last 3 Encounters:  12/19/21 167 lb (75.8 kg)  12/19/21 167 lb 9.6 oz (76 kg)  10/16/21 181 lb (82.1 kg)    Physical Exam Vitals reviewed.  Constitutional:      Appearance: Normal appearance.  HENT:     Nose: Mucosal edema, congestion and rhinorrhea present. Rhinorrhea is clear.     Right Sinus: No maxillary sinus tenderness or frontal sinus tenderness.     Left Sinus: No maxillary sinus tenderness or frontal sinus tenderness.     Mouth/Throat:     Mouth: Mucous membranes are moist.  Eyes:     General: No scleral icterus.    Conjunctiva/sclera: Conjunctivae normal.  Cardiovascular:     Rate and Rhythm: Normal rate and regular rhythm.     Heart sounds: No murmur heard. Pulmonary:     Breath sounds: No stridor. No wheezing, rhonchi or rales.   Abdominal:     General: Abdomen is flat.     Palpations: There is no mass.     Tenderness: There is no abdominal tenderness. There is no guarding.     Hernia: No hernia is present.  Musculoskeletal:        General: Normal range of motion.     Cervical back: Neck supple.     Right lower leg: No edema.     Left lower leg: No edema.  Lymphadenopathy:     Cervical: No cervical adenopathy.  Skin:    General: Skin is warm and dry.     Findings: No rash.  Neurological:     General: No focal deficit present.     Mental Status: She is alert. Mental status is at baseline.  Psychiatric:        Mood and Affect: Mood normal.        Behavior: Behavior normal.    Lab Results  Component Value Date   WBC 5.1 09/13/2021   HGB 13.6 09/13/2021   HCT 41.7 09/13/2021   PLT 395.0 09/13/2021   GLUCOSE 90 12/19/2021   CHOL 150 09/13/2021   TRIG 71.0 09/13/2021   HDL 55.40 09/13/2021   LDLCALC 80 09/13/2021   ALT 16 09/13/2021   AST 18 09/13/2021   NA 136 12/19/2021   K 3.5 12/19/2021   CL 99 12/19/2021   CREATININE 0.70 12/19/2021   BUN 9 12/19/2021   CO2 31 12/19/2021   TSH 1.23 09/13/2021   HGBA1C 5.6 09/13/2021    No results found.  Assessment & Plan:   [name] was seen today for hypertension.  Diagnoses and all orders for this visit:  Diuretic-induced hypokalemia- Her potassium level is normal now. -     Magnesium; Future -     Basic metabolic panel; Future -     Basic metabolic panel -     Magnesium  Tenosynovitis of thumb -     Ambulatory referral to Orthopedic Surgery  Essential hypertension- Her blood pressure is adequately well controlled.  Tobacco abuse -     Ambulatory Referral for Lung Cancer Scre  Allergic rhinitis, unspecified seasonality, unspecified trigger -     fluticasone (FLONASE) 50 MCG/ACT nasal spray; SHAKE LIQUID AND USE 2 SPRAYS IN EACH NOSTRIL DAILY  Need for prophylactic vaccination and inoculation against varicella -     Zoster Vaccine  Adjuvanted [facility]) injection; Inject 0.5 mLs into the muscle once for 1 dose.   I have discontinued [facility]'s HYDROcodone bit-homatropine and ondansetron. I am also having her start on Shingrix. Additionally, I am having her maintain her tiZANidine, pravastatin, indapamide, potassium chloride SA, amLODipine, Linzess, levocetirizine, ALPRAZolam, and fluticasone.  Meds ordered this encounter  Medications   fluticasone (FLONASE) 50 MCG/ACT nasal spray  Sig: SHAKE LIQUID AND USE 2 SPRAYS IN EACH NOSTRIL DAILY    Dispense:  48 g    Refill:  1   Zoster Vaccine Adjuvanted [facility]) injection    Sig: Inject 0.5 mLs into the muscle once for 1 dose.    Dispense:  0.5 mL    Refill:  0     Follow-up: Return in about 6 months (around 06/18/2022).  Scarlette Calico, MD

## 2021-12-25 ENCOUNTER — Telehealth: Payer: Self-pay | Admitting: Acute Care

## 2021-12-25 NOTE — Telephone Encounter (Signed)
Left message for pt to call back to schedule f/u lung screening CT scan.  ?

## 2022-01-01 ENCOUNTER — Other Ambulatory Visit: Payer: Self-pay | Admitting: Internal Medicine

## 2022-01-01 DIAGNOSIS — I10 Essential (primary) hypertension: Secondary | ICD-10-CM

## 2022-01-01 DIAGNOSIS — E876 Hypokalemia: Secondary | ICD-10-CM

## 2022-01-01 DIAGNOSIS — T502X5A Adverse effect of carbonic-anhydrase inhibitors, benzothiadiazides and other diuretics, initial encounter: Secondary | ICD-10-CM

## 2022-01-02 ENCOUNTER — Other Ambulatory Visit: Payer: Self-pay

## 2022-01-02 ENCOUNTER — Ambulatory Visit (INDEPENDENT_AMBULATORY_CARE_PROVIDER_SITE_OTHER): Payer: Medicare Other | Admitting: Orthopedic Surgery

## 2022-01-02 ENCOUNTER — Encounter: Payer: Self-pay | Admitting: Orthopedic Surgery

## 2022-01-02 ENCOUNTER — Ambulatory Visit: Payer: Self-pay

## 2022-01-02 VITALS — BP 119/81 | HR 76 | Ht 67.0 in | Wt 167.0 lb

## 2022-01-02 DIAGNOSIS — M79641 Pain in right hand: Secondary | ICD-10-CM

## 2022-01-02 DIAGNOSIS — M65311 Trigger thumb, right thumb: Secondary | ICD-10-CM

## 2022-01-02 NOTE — Progress Notes (Signed)
? ?Office Visit Note ?  ?Patient: [name]           ?Date of Birth: 05-Dec-1960           ?MRN: [id number] ?Visit Date: 01/02/2022 ?             ?Requested by: Janith Lima, MD ?[address] [place],  [place] [address] ?PCP: Janith Lima, MD ? ? ?Assessment & Plan: ?Visit Diagnoses:  ?1. Pain of right hand   ?2. Trigger thumb, right thumb   ? ? ?Plan: We discussed the diagnosis, prognosis, and both conservative and operative treatment options for trigger thumb. ? ?After our discussion, the patient has elected to proceed with right trigger thumb release.  We reviewed the benefits of surgery and the potential risks including, but not limited to, persistent symptoms, infection, damage to nearby nerves and blood vessels, delayed wound healing, need for additional surgery.   ? ?All patient concerns and questions were addressed. ? ?A surgical date will be confirmed with the patient.   ? ?Follow-Up Instructions: No follow-ups on file.  ? ?Orders:  ?Orders Placed This Encounter  ?Procedures  ? XR Hand Complete Right  ? ?No orders of the defined types were placed in this encounter. ? ? ? ? Procedures: ?No procedures performed ? ? ?Clinical Data: ?No additional findings. ? ? ?Subjective: ?Chief Complaint  ?Patient presents with  ? Right Thumb - Pain  ?  RIGHT handed, started around 11/20/2021, pain comes and goes but really hurts when she hits it.  ? ? ?This is a 61 year old right-hand-dominant female who presents with locking of her right thumb.  Is been going on since approximately January of this year.  She says initially she was able to bend at the IP joint but the thumb gets stuck in a flexed position and require her to unlock the thumb.  This worsened to the point where she had to use her contralateral hand to unlock the thumb and extend from a flexed position.  At this point, she is unable to flex the IP joint at all secondary to pain and apprehension.  She denies any previous triggering of any other  digits.  She denies any other concerns or complaints today. ? ? ?Review of Systems ? ? ?Objective: ?Vital Signs: BP 119/81 (BP Location: Left Arm, Patient Position: Sitting)   Pulse 76   Ht '5\' 7"'$  (1.702 m)   Wt 167 lb (75.8 kg)   BMI 26.16 kg/m?  ? ?Physical Exam ?Constitutional:   ?   Appearance: Normal appearance.  ?Cardiovascular:  ?   Rate and Rhythm: Normal rate.  ?   Pulses: Normal pulses.  ?Pulmonary:  ?   Effort: Pulmonary effort is normal.  ?Skin: ?   General: Skin is warm and dry.  ?   Capillary Refill: Capillary refill takes less than 2 seconds.  ?Neurological:  ?   Mental Status: She is alert.  ? ? ?Right Hand Exam  ? ?Tenderness  ?Right hand tenderness location: TTP at thumb A1 pulley with palpable nodule. ? ?Other  ?Erythema: absent ?Sensation: normal ?Pulse: present ? ?Comments:  Painful and palpable nodule at A1 pulley.  Minimal active flexion at IP joint secondary to pain and apprehension.  ? ? ? ? ?Specialty Comments:  ?No specialty comments available. ? ?Imaging: ?No results found. ? ? ?PMFS History: ?Patient Active Problem List  ? Diagnosis Date Noted  ? Trigger thumb, right thumb 01/02/2022  ? Tenosynovitis of thumb  12/19/2021  ? Tobacco abuse 09/15/2021  ? Chronic idiopathic constipation 09/15/2021  ? Chronic hyperglycemia 09/13/2021  ? Routine general medical examination at a health care facility 09/13/2021  ? Diuretic-induced hypokalemia 09/13/2021  ? DDD (degenerative disc disease), lumbar 03/07/2021  ? Screening for cervical cancer 07/07/2020  ? Essential hypertension 03/01/2020  ? Occipital neuralgia of right side 01/05/2020  ? Atherosclerosis of aorta ([place]) 03/31/2019  ? Dyslipidemia, goal LDL below 100 03/31/2019  ? Visit for screening mammogram 03/31/2019  ? Allergic rhinitis 02/22/2017  ? Overweight (BMI 25.0-29.9) 03/22/2016  ? GAD (generalized anxiety disorder) 04/13/2010  ? PREMATURE VENTRICULAR CONTRACTIONS 02/06/2010  ? MIGRAINE HEADACHE 07/24/2007  ? GASTROESOPHAGEAL REFLUX  DISEASE 07/24/2007  ? ?Past Medical History:  ?Diagnosis Date  ? Depression   ? DJD (degenerative joint disease)   ? GERD (gastroesophageal reflux disease)   ? History of palpitations   ? Migraine headache   ? 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  ?Family History  ?Problem Relation Age of Onset  ? Diabetes Mother   ? Multiple sclerosis Mother   ? Heart disease Father   ? Hypertension Sister   ? Kidney disease Sister   ? Colon cancer Neg Hx   ?  ?Past Surgical History:  ?Procedure Laterality Date  ? BACK SURGERY  10/23/12  ? CERVICAL FUSION    ? CHOLECYSTECTOMY    ? LAMINECTOMY    ? MRI    ? every 3 mths /due to menigitis  ? TOTAL ABDOMINAL HYSTERECTOMY    ? TUBAL LIGATION    ? ?Social History  ? ?Occupational History  ? Occupation: Retired/disabled  ?Tobacco Use  ? Smoking status: Every Day  ?  Packs/day: 1.00  ?  Years: 36.00  ?  Pack years: 36.00  ?  Types: Cigarettes  ?  Last attempt to quit: 09/2019  ?  Years since quitting: 2.2  ? Smokeless tobacco: Never  ?Vaping Use  ? Vaping Use: Never used  ?Substance and Sexual Activity  ? Alcohol use: No  ? Drug use: No  ? Sexual activity: Yes  ?  Partners: Male  ? ? ? ? ? ? ?

## 2022-02-21 ENCOUNTER — Ambulatory Visit
Admission: EM | Admit: 2022-02-21 | Discharge: 2022-02-21 | Disposition: A | Discharge status: Home / Self Care | Payer: Medicare Other

## 2022-02-21 ENCOUNTER — Other Ambulatory Visit: Payer: Self-pay

## 2022-02-21 ENCOUNTER — Emergency Department (HOSPITAL_COMMUNITY)
Admission: EM | Admit: 2022-02-21 | Discharge: 2022-02-21 | Disposition: A | Discharge status: Home / Self Care | Payer: Medicare Other

## 2022-02-21 ENCOUNTER — Emergency Department (HOSPITAL_COMMUNITY)
Admission: EM | Admit: 2022-02-21 | Discharge: 2022-02-21 | Disposition: A | Discharge status: Home / Self Care | Payer: Medicare Other | Attending: Emergency Medicine | Admitting: Emergency Medicine

## 2022-02-21 ENCOUNTER — Emergency Department (HOSPITAL_COMMUNITY): Payer: Medicare Other

## 2022-02-21 ENCOUNTER — Encounter: Payer: Self-pay | Admitting: Emergency Medicine

## 2022-02-21 ENCOUNTER — Encounter (HOSPITAL_COMMUNITY): Payer: Self-pay

## 2022-02-21 DIAGNOSIS — S0993XA Unspecified injury of face, initial encounter: Secondary | ICD-10-CM

## 2022-02-21 DIAGNOSIS — W228XXA Striking against or struck by other objects, initial encounter: Secondary | ICD-10-CM | POA: Diagnosis not present

## 2022-02-21 DIAGNOSIS — R220 Localized swelling, mass and lump, head: Secondary | ICD-10-CM | POA: Diagnosis not present

## 2022-02-21 DIAGNOSIS — R6884 Jaw pain: Secondary | ICD-10-CM | POA: Diagnosis not present

## 2022-02-21 DIAGNOSIS — S0083XA Contusion of other part of head, initial encounter: Secondary | ICD-10-CM

## 2022-02-21 DIAGNOSIS — Z23 Encounter for immunization: Secondary | ICD-10-CM | POA: Insufficient documentation

## 2022-02-21 DIAGNOSIS — S0990XA Unspecified injury of head, initial encounter: Secondary | ICD-10-CM | POA: Diagnosis present

## 2022-02-21 DIAGNOSIS — S0183XA Puncture wound without foreign body of other part of head, initial encounter: Secondary | ICD-10-CM | POA: Insufficient documentation

## 2022-02-21 MED ORDER — HYDROCODONE-ACETAMINOPHEN 5-325 MG PO TABS
1.0000 | ORAL_TABLET | Freq: Once | ORAL | Status: AC
  Administered 2022-02-21: 1 via ORAL
  Filled 2022-02-21: qty 1

## 2022-02-21 MED ORDER — CEPHALEXIN 500 MG PO CAPS
500.0000 mg | ORAL_CAPSULE | Freq: Once | ORAL | Status: AC
  Administered 2022-02-21: 500 mg via ORAL
  Filled 2022-02-21: qty 1

## 2022-02-21 MED ORDER — TETANUS-DIPHTH-ACELL PERTUSSIS 5-2.5-18.5 LF-MCG/0.5 IM SUSY
0.5000 mL | PREFILLED_SYRINGE | Freq: Once | INTRAMUSCULAR | Status: AC
  Administered 2022-02-21: 0.5 mL via INTRAMUSCULAR
  Filled 2022-02-21: qty 0.5

## 2022-02-21 MED ORDER — CEPHALEXIN 500 MG PO CAPS: 500.0000 mg | ORAL_CAPSULE | Freq: Three times a day (TID) | ORAL | 0 refills | Status: DC

## 2022-02-21 NOTE — Discharge Instructions (Signed)
The CT of your face did not show any broken bones or dislocations.  You have a small puncture wound to your right cheek area.  I recommend that you apply ice packs on and off to your cheek and face to help reduce swelling.  Keep the area clean with mild soap and water.  You may keep it bandaged if needed.  Take the antibiotic as directed until it is finished.  Continue your pain medication as well.  Please follow-up with your primary care provider in a few days for recheck.  Return to the emergency department for any new or worsening symptoms. ?

## 2022-02-21 NOTE — ED Triage Notes (Signed)
Pt reports that something hit here in the eye while weed eating today.  Reports pain to right side of face.  Pt is alert and oriented.  Denies loc.  Resp even and unlabored.  Skin warm and dry.  nad ?

## 2022-02-21 NOTE — ED Notes (Signed)
Patient is being discharged from the Urgent Care and sent to the Emergency Department via private vehicle . Per PA, patient is in need of higher level of care due to possible fracture of facial bones from mowing accident. Patient is aware and verbalizes understanding of plan of care.  ?Vitals:  ? 02/21/22 1753  ?BP: (!) 142/89  ?Pulse: 78  ?Resp: 18  ?Temp: 98.8 ?F (37.1 ?C)  ?SpO2: 96%  ? ? ?

## 2022-02-21 NOTE — ED Triage Notes (Signed)
While mowing today something hit her right eye.  Eye watering, painful and patient states it hurts to open the eye. ?

## 2022-02-21 NOTE — ED Provider Notes (Signed)
?[facility] ? ? ? ?CSN: [id number] ?Arrival date & time: 02/21/22  1727 ? ? ?  ? ?History   ?Chief Complaint ?No chief complaint on file. ? ? ?HPI ?[name] is a 61 y.o. female.  ? ?Presenting today with right-sided facial pain, swelling, decreased range of motion since an incident that occurred this evening where she was mowing the lawn and a large rock hit her directly in the right cheek.  She states she was at the emergency department earlier but left prior to being seen due to the wait time.  She did not lose consciousness and has had no dizziness, nausea, vomiting but is unable to open her right eye due to swelling and is unable to fully open her jaw and close her jaw, particular without severe pain.  Has not yet taken anything for symptoms, states she has hydrocodone at home that she gets from pain management. ? ? ?Past Medical History:  ?Diagnosis Date  ? Depression   ? DJD (degenerative joint disease)   ? GERD (gastroesophageal reflux disease)   ? History of palpitations   ? Migraine headache   ? 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 ? ?Patient Active Problem List  ? Diagnosis Date Noted  ? Trigger thumb, right thumb 01/02/2022  ? Tenosynovitis of thumb 12/19/2021  ? Tobacco abuse 09/15/2021  ? Chronic idiopathic constipation 09/15/2021  ? Chronic hyperglycemia 09/13/2021  ? Routine general medical examination at a health care facility 09/13/2021  ? Diuretic-induced hypokalemia 09/13/2021  ? DDD (degenerative disc disease), lumbar 03/07/2021  ? Screening for cervical cancer 07/07/2020  ? Essential hypertension 03/01/2020  ? Occipital neuralgia of right side 01/05/2020  ? Atherosclerosis of aorta ([place]) 03/31/2019  ? Dyslipidemia, goal LDL below 100 03/31/2019  ? Visit for screening mammogram 03/31/2019  ? Allergic rhinitis 02/22/2017  ? Overweight (BMI 25.0-29.9) 03/22/2016  ? GAD (generalized anxiety disorder) 04/13/2010  ? PREMATURE VENTRICULAR  CONTRACTIONS 02/06/2010  ? MIGRAINE HEADACHE 07/24/2007  ? GASTROESOPHAGEAL REFLUX DISEASE 07/24/2007  ? ? ?Past Surgical History:  ?Procedure Laterality Date  ? BACK SURGERY  10/23/12  ? CERVICAL FUSION    ? CHOLECYSTECTOMY    ? LAMINECTOMY    ? MRI    ? every 3 mths /due to menigitis  ? TOTAL ABDOMINAL HYSTERECTOMY    ? TUBAL LIGATION    ? ? ?OB History   ?No obstetric history on file. ?  ? ? ? ?Home Medications   ? ?Prior to Admission medications   ?Medication Sig Start Date End Date Taking? Authorizing Provider  ?HYDROcodone-acetaminophen (NORCO/VICODIN) 5-325 MG tablet Take 1 tablet by mouth every 6 (six) hours as needed for moderate pain.   Yes [provider]  ?ALPRAZolam (XANAX) 0.5 MG tablet Take 0.5 tablets (0.25 mg total) by mouth at bedtime. 12/12/21   Binnie Rail, MD  ?amLODipine (NORVASC) 5 MG tablet Take 1 tablet (5 mg total) by mouth daily. 09/15/21   Janith Lima, MD  ?fluticasone Asencion Islam) 50 MCG/ACT nasal spray SHAKE LIQUID AND USE 2 SPRAYS IN [facility] NOSTRIL DAILY 12/19/21   Janith Lima, MD  ?indapamide (LOZOL) 2.5 MG tablet TAKE 1 TABLET(2.5 MG) BY MOUTH DAILY 06/27/21   Janith Lima, MD  ?KLOR-CON M15 15 MEQ tablet TAKE 1 TABLET BY MOUTH TWICE DAILY 01/01/22   Janith Lima, MD  ?levocetirizine (XYZAL) 5 MG tablet TAKE 1 TABLET(5 MG) BY MOUTH EVERY EVENING 09/24/21   Ronnald Ramp,  Arvid Right, MD  ?Rolan Lipa 145 MCG CAPS capsule Take 1 capsule (145 mcg total) by mouth daily before breakfast. 09/15/21   Janith Lima, MD  ?pravastatin (PRAVACHOL) 10 MG tablet Take 1 tablet (10 mg total) by mouth daily. 06/12/21   Janith Lima, MD  ?tiZANidine (ZANAFLEX) 2 MG tablet Take 2 mg by mouth 3 (three) times daily as needed. 01/11/21   [provider]  ? ? ?Family History ?Family History  ?Problem Relation Age of Onset  ? Diabetes Mother   ? Multiple sclerosis Mother   ? Heart disease Father   ? Hypertension Sister   ? Kidney disease Sister   ? Colon cancer Neg Hx   ? ? ?Social  History ?Social History  ? ?Tobacco Use  ? Smoking status: Every Day  ?  Packs/day: 1.00  ?  Years: 36.00  ?  Pack years: 36.00  ?  Types: Cigarettes  ?  Last attempt to quit: 09/2019  ?  Years since quitting: 2.4  ? Smokeless tobacco: Never  ?Vaping Use  ? Vaping Use: Never used  ?Substance Use Topics  ? Alcohol use: No  ? Drug use: No  ? ? ? ?Allergies   ?Livalo [pitavastatin], Anesthesia s-i-40 [propofol], Augmentin [amoxicillin-pot clavulanate], and Doxycycline ? ? ?Review of Systems ?Review of Systems ?Per HPI ? ?Physical Exam ?Triage Vital Signs ?ED Triage Vitals [02/21/22 1753]  ?Enc Vitals Group  ?   BP (!) 142/89  ?   Pulse Rate 78  ?   Resp 18  ?   Temp 98.8 ?F (37.1 ?C)  ?   Temp Source Oral  ?   SpO2 96 %  ?   Weight   ?   Height   ?   Head Circumference   ?   Peak Flow   ?   Pain Score 7  ?   Pain Loc   ?   Pain Edu?   ?   Excl. in [place]?   ? ?No data found. ? ?Updated Vital Signs ?BP (!) 142/89 (BP Location: Right Arm)   Pulse 78   Temp 98.8 ?F (37.1 ?C) (Oral)   Resp 18   SpO2 96%  ? ?Visual Acuity ?Right Eye Distance:   ?Left Eye Distance:   ?Bilateral Distance:   ? ?Right Eye Near:   ?Left Eye Near:    ?Bilateral Near:    ? ?Physical Exam ?Vitals and nursing note reviewed.  ?Constitutional:   ?   Comments: Appears in significant pain  ?HENT:  ?   Head:  ?   Comments: Right-sided facial swelling, extending up to right eyelid ?Eyes:  ?   Extraocular Movements: Extraocular movements intact.  ?   Conjunctiva/sclera: Conjunctivae normal.  ?Cardiovascular:  ?   Rate and Rhythm: Normal rate and regular rhythm.  ?   Heart sounds: Normal heart sounds.  ?Pulmonary:  ?   Effort: Pulmonary effort is normal.  ?   Breath sounds: Normal breath sounds.  ?Musculoskeletal:     ?   General: Swelling, tenderness and signs of injury present. Normal range of motion.  ?   Cervical back: Normal range of motion and neck supple.  ?   Comments: Right facial swelling, severe tenderness to palpation extending all the way up to  periorbital region.  Limited ability to fully open and close jaw  ?Skin: ?   General: Skin is warm and dry.  ?Neurological:  ?   Mental Status: She is alert and  oriented to person, place, and time.  ?Psychiatric:     ?   Mood and Affect: Mood normal.     ?   Thought Content: Thought content normal.     ?   Judgment: Judgment normal.  ? ? ? ?UC Treatments / Results  ?Labs ?(all labs ordered are listed, but only abnormal results are displayed) ?Labs Reviewed - No data to display ? ?EKG ? ? ?Radiology ?No results found. ? ?Procedures ?Procedures (including critical care time) ? ?Medications Ordered in UC ?Medications - No data to display ? ?Initial Impression / Assessment and Plan / UC Course  ?I have reviewed the triage vital signs and the nursing notes. ? ?Pertinent labs & imaging results that were available during my care of the patient were reviewed by me and considered in my medical decision making (see chart for details). ? ?  ? ?Strongly recommended that patient return to the emergency department where more appropriate imaging studies can be done to evaluate for possible facial fracture.  She is agreeable and her family number who is with her today will drive her via private vehicle.  She is hemodynamically stable for transport. ? ?Final Clinical Impressions(s) / UC Diagnoses  ? ?Final diagnoses:  ?Facial injury, initial encounter  ?Jaw pain  ?Facial swelling  ? ?Discharge Instructions   ?None ?  ? ?ED Prescriptions   ?None ?  ? ?PDMP not reviewed this encounter. ?  ?Volney American, PA-C ?02/21/22 1818 ? ?
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
## 2022-03-01 ENCOUNTER — Other Ambulatory Visit: Payer: Self-pay | Admitting: Internal Medicine

## 2022-03-01 DIAGNOSIS — F411 Generalized anxiety disorder: Secondary | ICD-10-CM

## 2022-03-06 ENCOUNTER — Ambulatory Visit (INDEPENDENT_AMBULATORY_CARE_PROVIDER_SITE_OTHER): Payer: Medicare Other | Admitting: Internal Medicine

## 2022-03-06 ENCOUNTER — Encounter: Payer: Self-pay | Admitting: Internal Medicine

## 2022-03-06 VITALS — BP 136/86 | HR 86 | Temp 98.1°F | Resp 16 | Ht 68.0 in | Wt 161.0 lb

## 2022-03-06 DIAGNOSIS — E876 Hypokalemia: Secondary | ICD-10-CM

## 2022-03-06 DIAGNOSIS — T502X5A Adverse effect of carbonic-anhydrase inhibitors, benzothiadiazides and other diuretics, initial encounter: Secondary | ICD-10-CM | POA: Diagnosis not present

## 2022-03-06 DIAGNOSIS — L02411 Cutaneous abscess of right axilla: Secondary | ICD-10-CM | POA: Diagnosis not present

## 2022-03-06 DIAGNOSIS — I10 Essential (primary) hypertension: Secondary | ICD-10-CM | POA: Diagnosis not present

## 2022-03-06 LAB — BASIC METABOLIC PANEL
BUN: 9 mg/dL (ref 6–23)
CO2: 26 mEq/L (ref 19–32)
Calcium: 9.4 mg/dL (ref 8.4–10.5)
Chloride: 103 mEq/L (ref 96–112)
Creatinine, Ser: 0.75 mg/dL (ref 0.40–1.20)
GFR: 86.23 mL/min (ref >60.00)
Glucose, Bld: 83 mg/dL (ref 70–99)
Potassium: 3.9 mEq/L (ref 3.5–5.1)
Sodium: 137 mEq/L (ref 135–145)

## 2022-03-06 LAB — CBC WITH DIFFERENTIAL/PLATELET
Basophils Absolute: 0 10*3/uL (ref 0.0–0.1)
Basophils Relative: 0.6 % (ref 0.0–3.0)
Eosinophils Absolute: 0 10*3/uL (ref 0.0–0.7)
Eosinophils Relative: 0.6 % (ref 0.0–5.0)
HCT: 40.2 % (ref 36.0–46.0)
Hemoglobin: 13.3 g/dL (ref 12.0–15.0)
Lymphocytes Relative: 49.1 % — ABNORMAL HIGH (ref 12.0–46.0)
Lymphs Abs: 3 10*3/uL (ref 0.7–4.0)
MCHC: 33.2 g/dL (ref 30.0–36.0)
MCV: 88.2 fl (ref 78.0–100.0)
Monocytes Absolute: 0.3 10*3/uL (ref 0.1–1.0)
Monocytes Relative: 4.2 % (ref 3.0–12.0)
Neutro Abs: 2.8 10*3/uL (ref 1.4–7.7)
Neutrophils Relative %: 45.5 % (ref 43.0–77.0)
Platelets: 369 10*3/uL (ref 150.0–400.0)
RBC: 4.56 Mil/uL (ref 3.87–5.11)
RDW: 13.5 % (ref 11.5–15.5)
WBC: 6.1 10*3/uL (ref 4.0–10.5)

## 2022-03-06 NOTE — Patient Instructions (Signed)
Incision and Drainage, Care After This sheet gives you information about how to care for yourself after your procedure. Your health care provider may also give you more specific instructions. If you have problems or questions, contact your health care provider. What can I expect after the procedure? After the procedure, it is common to have: Pain or discomfort around the incision site. Blood, fluid, or pus (drainage) from the incision. Redness and firm skin around the incision site. Follow these instructions at home: Medicines Take over-the-counter and prescription medicines only as told by your health care provider. If you were prescribed an antibiotic medicine, use or take it as told by your health care provider. Do not stop using the antibiotic even if you start to feel better. Wound care Follow instructions from your health care provider about how to take care of your wound. Make sure you: Wash your hands with soap and water before and after you change your bandage (dressing). If soap and water are not available, use hand sanitizer. Change your dressing and packing as told by your health care provider. If your dressing is dry or stuck when you try to remove it, moisten or wet the dressing with saline or water so that it can be removed without harming your skin or tissues. If your wound is packed, leave it in place until your health care provider tells you to remove it. To remove the packing, moisten or wet the packing with saline or water so that it can be removed without harming your skin or tissues. Leave stitches (sutures), skin glue, or adhesive strips in place. These skin closures may need to stay in place for 2 weeks or longer. If adhesive strip edges start to loosen and curl up, you may trim the loose edges. Do not remove adhesive strips completely unless your health care provider tells you to do that. Check your wound every day for signs of infection. Check for: More redness, swelling,  or pain. More fluid or blood. Warmth. Pus or a bad smell. If you were sent home with a drain tube in place, follow instructions from your health care provider about: How to empty it. How to care for it at home.  General instructions Rest the affected area. Do not take baths, swim, or use a hot tub until your health care provider approves. Ask your health care provider if you may take showers. You may only be allowed to take sponge baths. Return to your normal activities as told by your health care provider. Ask your health care provider what activities are safe for you. Your health care provider may put you on activity or lifting restrictions. The incision will continue to drain. It is normal to have some clear or slightly bloody drainage. The amount of drainage should lessen each day. Do not apply any creams, ointments, or liquids unless you have been told to by your health care provider. Keep all follow-up visits as told by your health care provider. This is important. Contact a health care provider if: Your cyst or abscess returns. You have more redness, swelling, or pain around your incision. You have more fluid or blood coming from your incision. Your incision feels warm to the touch. You have pus or a bad smell coming from your incision. You have red streaks above or below the incision site. Get help right away if: You have severe pain or bleeding. You cannot eat or drink without vomiting. You have a fever or chills. You have redness that spreads   quickly. You have decreased urine output. You become short of breath. You have chest pain. You cough up blood. The affected area becomes numb or starts to tingle. These symptoms may represent a serious problem that is an emergency. Do not wait to see if the symptoms will go away. Get medical help right away. Call your local emergency services (911 in the [place] Do not drive yourself to the hospital. Summary After this procedure, it is  common to have fluid, blood, or pus coming from the surgery site. Follow all home care instructions. You will be told how to take care of your incision, how to check for infection, and how to take medicines. If you were prescribed an antibiotic medicine, take it as told by your health care provider. Do not stop taking the antibiotic even if you start to feel better. Contact a health care provider if you have increased redness, swelling, or pain around your incision. Get help right away if you have chest pain, you vomit, you cough up blood, or you have shortness of breath. Keep all follow-up visits as told by your health care provider. This is important. This information is not intended to replace advice given to you by your health care provider. Make sure you discuss any questions you have with your health care provider. Document Revised: 07/27/2021 Document Reviewed: 07/27/2021 Elsevier Patient Education  2023 Elsevier Inc.  

## 2022-03-06 NOTE — Progress Notes (Signed)
? ? ?Subjective:  ?Patient ID: [name], female    DOB: [birth year]  Age: 61 y.o. MRN: [id number] ? ?CC: Hypertension ? ? ?HPI ?[place] presents for f/up -- ? ?She complains of a several week hx of right armpit mass that drains white fluid. ? ?Outpatient Medications Prior to Visit  ?Medication Sig Dispense Refill  ? ALPRAZolam (XANAX) 0.5 MG tablet TAKE 1 TABLET BY MOUTH AT BEDTIME 90 tablet 4  ? amLODipine (NORVASC) 5 MG tablet Take 1 tablet (5 mg total) by mouth daily. 90 tablet 1  ? fluticasone (FLONASE) 50 MCG/ACT nasal spray SHAKE LIQUID AND USE 2 SPRAYS IN EACH NOSTRIL DAILY 48 g 1  ? HYDROcodone-acetaminophen (NORCO/VICODIN) 5-325 MG tablet Take 1 tablet by mouth every 6 (six) hours as needed for moderate pain.    ? indapamide (LOZOL) 2.5 MG tablet TAKE 1 TABLET(2.5 MG) BY MOUTH DAILY 90 tablet 0  ? KLOR-CON M15 15 MEQ tablet TAKE 1 TABLET BY MOUTH TWICE DAILY 180 tablet 0  ? levocetirizine (XYZAL) 5 MG tablet TAKE 1 TABLET(5 MG) BY MOUTH EVERY EVENING 90 tablet 1  ? LINZESS 145 MCG CAPS capsule Take 1 capsule (145 mcg total) by mouth daily before breakfast. 90 each 1  ? pravastatin (PRAVACHOL) 10 MG tablet Take 1 tablet (10 mg total) by mouth daily. 90 tablet 1  ? tiZANidine (ZANAFLEX) 2 MG tablet Take 2 mg by mouth 3 (three) times daily as needed.    ? cephALEXin (KEFLEX) 500 MG capsule Take 1 capsule (500 mg total) by mouth 3 (three) times daily. 21 capsule 0  ? ?No facility-administered medications prior to visit.  ? ? ?ROS ?Review of Systems  ?Constitutional:  Negative for chills, diaphoresis, fatigue and fever.  ?HENT: Negative.    ?Eyes: Negative.   ?Respiratory:  Negative for choking, shortness of breath and wheezing.   ?Cardiovascular:  Negative for chest pain, palpitations and leg swelling.  ?Gastrointestinal:  Negative for abdominal pain, constipation, diarrhea and vomiting.  ?Endocrine: Negative.   ?Genitourinary: Negative.   ?Musculoskeletal: Negative.  Negative for arthralgias.   ?Skin: Negative.  Negative for color change.  ?Neurological:  Negative for dizziness and weakness.  ?Hematological:  Negative for adenopathy. Does not bruise/bleed easily.  ?Psychiatric/Behavioral: Negative.    ? ?Objective:  ?BP 136/86 (BP Location: Left Arm, Patient Position: Sitting, Cuff Size: Normal)   Pulse 86   Temp 98.1 ?F (36.7 ?C) (Oral)   Resp 16   Ht '5\' 8"'$  (1.727 m)   Wt 161 lb (73 kg)   SpO2 97%   BMI 24.48 kg/m?  ? ?BP Readings from Last 3 Encounters:  ?03/06/22 136/86  ?02/21/22 (!) 145/94  ?02/21/22 (!) 142/89  ? ? ?Wt Readings from Last 3 Encounters:  ?03/06/22 161 lb (73 kg)  ?02/21/22 167 lb (75.8 kg)  ?01/02/22 167 lb (75.8 kg)  ? ? ?Physical Exam ?Constitutional:   ?   Appearance: She is not ill-appearing.  ?HENT:  ?   Mouth/Throat:  ?   Mouth: Mucous membranes are moist.  ?Eyes:  ?   General: No scleral icterus. ?   Conjunctiva/sclera: Conjunctivae normal.  ?Cardiovascular:  ?   Rate and Rhythm: Normal rate and regular rhythm.  ?   Heart sounds: No murmur heard. ?Pulmonary:  ?   Effort: Pulmonary effort is normal.  ?   Breath sounds: No stridor. No wheezing, rhonchi or rales.  ?Chest:  ?   Comments: Right axilla- There is a 3 x 2  cm subcutaneous mass that is draining a cheesy, nonpurulent exudate.  There is no erythema, warmth, streaking, induration, or fluctuance. ?Abdominal:  ?   General: Abdomen is flat.  ?   Palpations: There is no mass.  ?   Tenderness: There is no abdominal tenderness. There is no guarding.  ?   Hernia: No hernia is present.  ?Musculoskeletal:     ?   General: Normal range of motion.  ?   Cervical back: Neck supple.  ?   Right lower leg: No edema.  ?   Left lower leg: No edema.  ?Lymphadenopathy:  ?   Cervical: No cervical adenopathy.  ?Skin: ?   General: Skin is warm and dry.  ?Neurological:  ?   General: No focal deficit present.  ?   Mental Status: She is alert.  ?Psychiatric:     ?   Mood and Affect: Mood normal.     ?   Behavior: Behavior normal.  ? ? ?Lab  Results  ?Component Value Date  ? WBC 6.1 03/06/2022  ? HGB 13.3 03/06/2022  ? HCT 40.2 03/06/2022  ? PLT 369.0 03/06/2022  ? GLUCOSE 83 03/06/2022  ? CHOL 150 09/13/2021  ? TRIG 71.0 09/13/2021  ? HDL 55.40 09/13/2021  ? [place] 80 09/13/2021  ? ALT 16 09/13/2021  ? AST 18 09/13/2021  ? NA 137 03/06/2022  ? K 3.9 03/06/2022  ? CL 103 03/06/2022  ? CREATININE 0.75 03/06/2022  ? BUN 9 03/06/2022  ? CO2 26 03/06/2022  ? TSH 1.23 09/13/2021  ? HGBA1C 5.6 09/13/2021  ? ? ?CT Maxillofacial Wo Contrast ? ?Result Date: 02/21/2022 ?CLINICAL DATA:  Facial trauma right face. EXAM: CT MAXILLOFACIAL WITHOUT CONTRAST TECHNIQUE: Multidetector CT imaging of the maxillofacial structures was performed. Multiplanar CT image reconstructions were also generated. RADIATION DOSE REDUCTION: This exam was performed according to the departmental dose-optimization program which includes automated exposure control, adjustment of the mA and/or kV according to patient size and/or use of iterative reconstruction technique. COMPARISON:  None. FINDINGS: Osseous: Negative for facial fracture Orbits: Negative for orbital fracture.  No orbital mass or edema. Sinuses: Mild mucosal edema paranasal sinuses.  No air-fluid level. Soft tissues: No soft tissue swelling or hematoma. Dermal calcifications. Limited intracranial: Negative IMPRESSION: No acute abnormality.  Negative for facial fracture. Electronically Signed   By: Franchot Gallo M.D.   On: 02/21/2022 19:53  ? ?After informed verbal consent was obtained. Using Betadine for cleansing and 2% Lidocaine with epinephrine for anesthetic (3 cc's used), with sterile technique a 6 mm punch incision was made and a cavity was found filled with a cheesy substance but no exudate.. The cavity was cultured and irrigated with H2O2 and Qtips. No deep tracking or loculations were found. The cavity was packed with iodoform. Hemostasis was obtained by pressure. The specimen is labeled and sent. The procedure was well  tolerated without complications.  ? ?Assessment & Plan:  ? ?Shirley was seen today for hypertension. ? ?Diagnoses and all orders for this visit: ? ?Abscess of axilla, right- I do not think antibiotics are indicated at this time.  If the culture is positive then will prescribe an antibiotic.  This looks like a sebaceous cyst that has been adequately incised and drained. ?-     WOUND CULTURE; Future ?-     CBC with Differential/Platelet; Future ?-     CBC with Differential/Platelet ?-     WOUND CULTURE ? ?Essential hypertension- Her blood pressure is well controlled. ?-  Basic metabolic panel; Future ?-     CBC with Differential/Platelet; Future ?-     CBC with Differential/Platelet ?-     Basic metabolic panel ? ?Diuretic-induced hypokalemia- Her potassium level is normal now. ?-     Basic metabolic panel; Future ?-     Basic metabolic panel ? ? ?I have discontinued Cyriah C. Huster's cephALEXin. I am also having her maintain her tiZANidine, pravastatin, indapamide, amLODipine, Linzess, levocetirizine, fluticasone, Klor-Con M15, HYDROcodone-acetaminophen, and ALPRAZolam. ? ?No orders of the defined types were placed in this encounter. ? ? ? ?Follow-up: Return in about 2 days (around 03/08/2022). ? ?Scarlette Calico, MD ?

## 2022-03-08 ENCOUNTER — Ambulatory Visit (INDEPENDENT_AMBULATORY_CARE_PROVIDER_SITE_OTHER): Payer: Medicare Other | Admitting: Internal Medicine

## 2022-03-08 ENCOUNTER — Encounter: Payer: Self-pay | Admitting: Internal Medicine

## 2022-03-08 VITALS — Temp 98.5°F | Resp 16

## 2022-03-08 DIAGNOSIS — Z5189 Encounter for other specified aftercare: Secondary | ICD-10-CM | POA: Insufficient documentation

## 2022-03-08 DIAGNOSIS — L02411 Cutaneous abscess of right axilla: Secondary | ICD-10-CM

## 2022-03-08 DIAGNOSIS — I10 Essential (primary) hypertension: Secondary | ICD-10-CM

## 2022-03-08 NOTE — Progress Notes (Signed)
? ? ?Subjective:  ?Patient ID: [name], female    DOB: Jan 28, 1961  Age: 61 y.o. MRN: [id number] ? ?CC: Wound Check ? ? ?HPI ?[name] presents for f/up- ? ?She underwent incision and drainage of a cystic lesion in her right axilla 2 days ago.  She returns to have the packing removed.  She says there is no pain, swelling, or drainage from the area.  She feels well and offers no complaints. ? ?Outpatient Medications Prior to Visit  ?Medication Sig Dispense Refill  ? ALPRAZolam (XANAX) 0.5 MG tablet TAKE 1 TABLET BY MOUTH AT BEDTIME 90 tablet 4  ? amLODipine (NORVASC) 5 MG tablet Take 1 tablet (5 mg total) by mouth daily. 90 tablet 1  ? fluticasone (FLONASE) 50 MCG/ACT nasal spray SHAKE LIQUID AND USE 2 SPRAYS IN EACH NOSTRIL DAILY 48 g 1  ? HYDROcodone-acetaminophen (NORCO/VICODIN) 5-325 MG tablet Take 1 tablet by mouth every 6 (six) hours as needed for moderate pain.    ? indapamide (LOZOL) 2.5 MG tablet TAKE 1 TABLET(2.5 MG) BY MOUTH DAILY 90 tablet 0  ? KLOR-CON M15 15 MEQ tablet TAKE 1 TABLET BY MOUTH TWICE DAILY 180 tablet 0  ? levocetirizine (XYZAL) 5 MG tablet TAKE 1 TABLET(5 MG) BY MOUTH EVERY EVENING 90 tablet 1  ? LINZESS 145 MCG CAPS capsule Take 1 capsule (145 mcg total) by mouth daily before breakfast. 90 each 1  ? pravastatin (PRAVACHOL) 10 MG tablet Take 1 tablet (10 mg total) by mouth daily. 90 tablet 1  ? tiZANidine (ZANAFLEX) 2 MG tablet Take 2 mg by mouth 3 (three) times daily as needed.    ? ?No facility-administered medications prior to visit.  ? ? ?ROS ?Review of Systems  ?All other systems reviewed and are negative. ? ?Objective:  ?Temp 98.5 ?F (36.9 ?C)   Resp 16  ? ?BP Readings from Last 3 Encounters:  ?03/06/22 136/86  ?02/21/22 (!) 145/94  ?02/21/22 (!) 142/89  ? ? ?Wt Readings from Last 3 Encounters:  ?03/06/22 161 lb (73 kg)  ?02/21/22 167 lb (75.8 kg)  ?01/02/22 167 lb (75.8 kg)  ? ? ?Physical Exam ?Vitals reviewed.  ?Constitutional:   ?   Appearance: She is not  ill-appearing.  ?Chest:  ?   Comments: Right axilla examined.  The packing was removed.  There is no exudate, erythema, induration, fluctuance, or streaking. ?Neurological:  ?   Mental Status: She is alert.  ? ? ?Lab Results  ?Component Value Date  ? WBC 6.1 03/06/2022  ? HGB 13.3 03/06/2022  ? HCT 40.2 03/06/2022  ? PLT 369.0 03/06/2022  ? GLUCOSE 83 03/06/2022  ? CHOL 150 09/13/2021  ? TRIG 71.0 09/13/2021  ? HDL 55.40 09/13/2021  ? [place] 80 09/13/2021  ? ALT 16 09/13/2021  ? AST 18 09/13/2021  ? NA 137 03/06/2022  ? K 3.9 03/06/2022  ? CL 103 03/06/2022  ? CREATININE 0.75 03/06/2022  ? BUN 9 03/06/2022  ? CO2 26 03/06/2022  ? TSH 1.23 09/13/2021  ? HGBA1C 5.6 09/13/2021  ? ? ?CT Maxillofacial Wo Contrast ? ?Result Date: 02/21/2022 ?CLINICAL DATA:  Facial trauma right face. EXAM: CT MAXILLOFACIAL WITHOUT CONTRAST TECHNIQUE: Multidetector CT imaging of the maxillofacial structures was performed. Multiplanar CT image reconstructions were also generated. RADIATION DOSE REDUCTION: This exam was performed according to the departmental dose-optimization program which includes automated exposure control, adjustment of the mA and/or kV according to patient size and/or use of iterative reconstruction technique. COMPARISON:  None.  FINDINGS: Osseous: Negative for facial fracture Orbits: Negative for orbital fracture.  No orbital mass or edema. Sinuses: Mild mucosal edema paranasal sinuses.  No air-fluid level. Soft tissues: No soft tissue swelling or hematoma. Dermal calcifications. Limited intracranial: Negative IMPRESSION: No acute abnormality.  Negative for facial fracture. Electronically Signed   By: Franchot Gallo M.D.   On: 02/21/2022 19:53  ? ? ?Assessment & Plan:  ? ?Milissa was seen today for wound check. ? ?Diagnoses and all orders for this visit: ? ?Visit for wound check- The wound is healing without complications. ? ?Abscess of axilla, right- The clx is negative. Antibiotics are not indicated. ? ?Essential  hypertension- Her BP is well controlled. ? ? ?I am having [name] maintain her tiZANidine, pravastatin, indapamide, amLODipine, Linzess, levocetirizine, fluticasone, Klor-Con M15, HYDROcodone-acetaminophen, and ALPRAZolam. ? ?No orders of the defined types were placed in this encounter. ? ? ? ?Follow-up: Return if symptoms worsen or fail to improve. ? ?Scarlette Calico, MD ?

## 2022-03-08 NOTE — Patient Instructions (Signed)

## 2022-03-09 LAB — WOUND CULTURE

## 2022-04-04 ENCOUNTER — Other Ambulatory Visit: Payer: Self-pay | Admitting: Internal Medicine

## 2022-04-04 DIAGNOSIS — I10 Essential (primary) hypertension: Secondary | ICD-10-CM

## 2022-06-06 ENCOUNTER — Encounter: Payer: Self-pay | Admitting: Internal Medicine

## 2022-06-06 ENCOUNTER — Ambulatory Visit (INDEPENDENT_AMBULATORY_CARE_PROVIDER_SITE_OTHER): Payer: Medicare Other | Admitting: Internal Medicine

## 2022-06-06 VITALS — BP 132/86 | HR 69 | Temp 98.1°F | Ht 68.0 in | Wt 152.5 lb

## 2022-06-06 DIAGNOSIS — Z720 Tobacco use: Secondary | ICD-10-CM

## 2022-06-06 DIAGNOSIS — R200 Anesthesia of skin: Secondary | ICD-10-CM | POA: Diagnosis not present

## 2022-06-06 DIAGNOSIS — J0100 Acute maxillary sinusitis, unspecified: Secondary | ICD-10-CM

## 2022-06-06 DIAGNOSIS — F322 Major depressive disorder, single episode, severe without psychotic features: Secondary | ICD-10-CM

## 2022-06-06 DIAGNOSIS — R202 Paresthesia of skin: Secondary | ICD-10-CM

## 2022-06-06 DIAGNOSIS — E876 Hypokalemia: Secondary | ICD-10-CM

## 2022-06-06 DIAGNOSIS — J309 Allergic rhinitis, unspecified: Secondary | ICD-10-CM | POA: Diagnosis not present

## 2022-06-06 DIAGNOSIS — I10 Essential (primary) hypertension: Secondary | ICD-10-CM

## 2022-06-06 DIAGNOSIS — F411 Generalized anxiety disorder: Secondary | ICD-10-CM

## 2022-06-06 DIAGNOSIS — T502X5A Adverse effect of carbonic-anhydrase inhibitors, benzothiadiazides and other diuretics, initial encounter: Secondary | ICD-10-CM

## 2022-06-06 DIAGNOSIS — E5111 Dry beriberi: Secondary | ICD-10-CM

## 2022-06-06 LAB — HEPATIC FUNCTION PANEL
ALT: 14 U/L (ref 0–35)
AST: 18 U/L (ref 0–37)
Albumin: 4.5 g/dL (ref 3.5–5.2)
Alkaline Phosphatase: 74 U/L (ref 39–117)
Bilirubin, Direct: 0.2 mg/dL (ref 0.0–0.3)
Total Bilirubin: 1.1 mg/dL (ref 0.2–1.2)
Total Protein: 8.3 g/dL (ref 6.0–8.3)

## 2022-06-06 LAB — CBC WITH DIFFERENTIAL/PLATELET
Basophils Absolute: 0.1 10*3/uL (ref 0.0–0.1)
Basophils Relative: 1.4 % (ref 0.0–3.0)
Eosinophils Absolute: 0.1 10*3/uL (ref 0.0–0.7)
Eosinophils Relative: 1.3 % (ref 0.0–5.0)
HCT: 41.1 % (ref 36.0–46.0)
Hemoglobin: 13.5 g/dL (ref 12.0–15.0)
Lymphocytes Relative: 41.6 % (ref 12.0–46.0)
Lymphs Abs: 2.4 10*3/uL (ref 0.7–4.0)
MCHC: 32.9 g/dL (ref 30.0–36.0)
MCV: 89 fl (ref 78.0–100.0)
Monocytes Absolute: 0.3 10*3/uL (ref 0.1–1.0)
Monocytes Relative: 5.5 % (ref 3.0–12.0)
Neutro Abs: 2.8 10*3/uL (ref 1.4–7.7)
Neutrophils Relative %: 50.2 % (ref 43.0–77.0)
Platelets: 375 10*3/uL (ref 150.0–400.0)
RBC: 4.62 Mil/uL (ref 3.87–5.11)
RDW: 13.3 % (ref 11.5–15.5)
WBC: 5.7 10*3/uL (ref 4.0–10.5)

## 2022-06-06 LAB — BASIC METABOLIC PANEL
BUN: 9 mg/dL (ref 6–23)
CO2: 26 mEq/L (ref 19–32)
Calcium: 9.7 mg/dL (ref 8.4–10.5)
Chloride: 103 mEq/L (ref 96–112)
Creatinine, Ser: 0.8 mg/dL (ref 0.40–1.20)
GFR: 79.66 mL/min (ref >60.00)
Glucose, Bld: 83 mg/dL (ref 70–99)
Potassium: 4.1 mEq/L (ref 3.5–5.1)
Sodium: 137 mEq/L (ref 135–145)

## 2022-06-06 LAB — VITAMIN B12: Vitamin B-12: 519 pg/mL (ref 211–911)

## 2022-06-06 LAB — FOLATE: Folate: 6.4 ng/mL (ref >5.9)

## 2022-06-06 LAB — TSH: TSH: 2.08 u[IU]/mL (ref 0.35–5.50)

## 2022-06-06 MED ORDER — MIRTAZAPINE 7.5 MG PO TABS: 7.5000 mg | ORAL_TABLET | Freq: Every day | ORAL | 0 refills | Status: DC

## 2022-06-06 MED ORDER — SULFAMETHOXAZOLE-TRIMETHOPRIM 800-160 MG PO TABS: 1.0000 | ORAL_TABLET | Freq: Two times a day (BID) | ORAL | 0 refills | Status: AC

## 2022-06-06 NOTE — Patient Instructions (Signed)
Major Depressive Disorder, Adult Major depressive disorder (MDD) is a mental health condition. It may also be called clinical depression or unipolar depression. MDD causes symptoms of sadness, hopelessness, and loss of interest in things. These symptoms last most of the day, almost every day, for 2 weeks. MDD can also cause physical symptoms. It can interfere with relationships and with everyday activities, such as work, school, and activities that are usually pleasant. MDD may be mild, moderate, or severe. It may be single-episode MDD, which happens once, or recurrent MDD, which may occur multiple times. What are the causes? The exact cause of this condition is not known. MDD is most likely caused by a combination of things, which may include: Your personality traits. Learned or conditioned behaviors or thoughts or feelings that reinforce negativity. Any alcohol or substance misuse. Long-term (chronic) physical or mental health illness. Going through a traumatic experience or major life changes. What increases the risk? The following factors may make someone more likely to develop MDD: A family history of depression. Being a woman. Troubled family relationships. Abnormally low levels of certain brain chemicals. Traumatic or painful events in childhood, especially abuse or loss of a parent. A lot of stress from life experiences, such as poor living conditions or discrimination. Chronic physical illness or other mental health disorders. What are the signs or symptoms? The main symptoms of MDD usually include: Constant depressed or irritable mood. A loss of interest in things and activities. Other symptoms include: Sleeping or eating too much or too little. Unexplained weight gain or weight loss. Tiredness or low energy. Being agitated, restless, or weak. Feeling hopeless, worthless, or guilty. Trouble thinking clearly or making decisions. Thoughts of suicide or thoughts of harming  others. Isolating oneself or avoiding other people or activities. Trouble completing tasks, work, or any normal obligations. Severe symptoms of this condition may include: Psychotic depression.This may include false beliefs, or delusions. It may also include seeing, hearing, tasting, smelling, or feeling things that are not real (hallucinations). Chronic depression or persistent depressive disorder. This is low-level depression that lasts for at least 2 years. Melancholic depression, or feeling extremely sad and hopeless. Catatonic depression, which includes trouble speaking and trouble moving. How is this diagnosed? This condition may be diagnosed based on: Your symptoms. Your medical and mental health history. You may be asked questions about your lifestyle, including any drug and alcohol use. A physical exam. Blood tests to rule out other conditions. MDD is confirmed if you have the following symptoms most of the day, nearly every day, in a 2-week period: Either a depressed mood or loss of interest. At least four other MDD symptoms. How is this treated? This condition is usually treated by mental health professionals, such as psychologists, psychiatrists, and clinical social workers. You may need more than one type of treatment. Treatment may include: Psychotherapy, also called talk therapy or counseling. Types of psychotherapy include: Cognitive behavioral therapy (CBT). This teaches you to recognize unhealthy feelings, thoughts, and behaviors, and replace them with positive thoughts and actions. Interpersonal therapy (IPT). This helps you to improve the way you communicate with others or relate to them. Family therapy. This treatment includes members of your family. Medicines to treat anxiety and depression. These medicines help to balance the brain chemicals that affect your emotions. Lifestyle changes. You may be asked to: Limit alcohol use and avoid drug use. Get regular  exercise. Get plenty of sleep. Make healthy eating choices. Spend more time outdoors. Brain stimulation. This may  be done if symptoms are very severe and other treatments have not worked. Examples of this treatment are electroconvulsive therapy and transcranial magnetic stimulation. Follow these instructions at home: Activity Exercise regularly and spend time outdoors. Find activities that you enjoy doing, and make time to do them. Find healthy ways to manage stress, such as: Meditation or deep breathing. Spending time in nature. Journaling. Return to your normal activities as told by your health care provider. Ask your health care provider what activities are safe for you. Alcohol and drug use If you drink alcohol: Limit how much you use to: 0-1 drink a day for women who are not pregnant. 0-2 drinks a day for men. Be aware of how much alcohol is in your drink. In the [place] one drink equals one 12 oz bottle of beer (355 mL), one 5 oz glass of wine (148 mL), or one 1 oz glass of hard liquor (44 mL). Discuss your alcohol use with your health care provider. Alcohol can affect any antidepressant medicines you are taking. Discuss any drug use with your health care provider. General instructions  Take over-the-counter and prescription medicines only as told by your health care provider. Eat a healthy diet and get plenty of sleep. Consider joining a support group. Your health care provider may be able to recommend one. Keep all follow-up visits as told by your health care provider. This is important. Where to find more information [employer] on Mental Illness: www.nami.[place]: https://carter.com/ Contact a health care provider if: Your symptoms get worse. You develop new symptoms. Get help right away if: You self-harm. You have serious thoughts about hurting yourself or others. You hallucinate. If you ever feel like you may hurt yourself or  others, or have thoughts about taking your own life, get help right away. Go to your nearest emergency department or: Call your local emergency services (911 in the [place] Call a suicide crisis helpline, such as the [place] at 386-670-5859 or 988 in the [place]. This is open 24 hours a day in the [place] Text the Crisis Text Line at (785) 182-7768 (in the [place].). Summary Major depressive disorder (MDD) is a mental health condition. MDD causes symptoms of sadness, hopelessness, and loss of interest in things. These symptoms last most of the day, almost every day, for 2 weeks. The symptoms of MDD can interfere with relationships and with everyday activities. Treatments and support are available for people who develop MDD. You may need more than one type of treatment. Get help right away if you have serious thoughts about hurting yourself or others. This information is not intended to replace advice given to you by your health care provider. Make sure you discuss any questions you have with your health care provider. Document Revised: 05/10/2021 Document Reviewed: 09/26/2019 Elsevier Patient Education  [place].

## 2022-06-06 NOTE — Progress Notes (Signed)
Subjective:  Patient ID: [name], female    DOB: Jan 26, 1961  Age: 61 y.o. MRN: [id number]  CC: Hypertension, Depression, and Sinusitis   HPI [name] presents for f/up -  In the last year she has lost both her husband and her son.  She tells me her son was murdered 2 months ago.  She complains of weight loss, crying spells, insomnia, and anxiety.  She does not feel hopeless, helpless, suicidal, or homicidal.  She is active and denies chest pain, shortness of breath, or dyspnea on exertion.  She complains of a 51-monthhistory of a numbness and tingling sensation, symmetrically, in both upper and lower extremities.  Outpatient Medications Prior to Visit  Medication Sig Dispense Refill   ALPRAZolam (XANAX) 0.5 MG tablet TAKE 1 TABLET BY MOUTH AT BEDTIME 90 tablet 4   amLODipine (NORVASC) 5 MG tablet TAKE 1 TABLET(5 MG) BY MOUTH DAILY 90 tablet 1   HYDROcodone-acetaminophen (NORCO/VICODIN) 5-325 MG tablet Take 1 tablet by mouth every 6 (six) hours as needed for moderate pain.     indapamide (LOZOL) 2.5 MG tablet TAKE 1 TABLET(2.5 MG) BY MOUTH DAILY 90 tablet 0   KLOR-CON M15 15 MEQ tablet TAKE 1 TABLET BY MOUTH TWICE DAILY 180 tablet 0   levocetirizine (XYZAL) 5 MG tablet TAKE 1 TABLET(5 MG) BY MOUTH EVERY EVENING 90 tablet 1   LINZESS 145 MCG CAPS capsule Take 1 capsule (145 mcg total) by mouth daily before breakfast. 90 each 1   pravastatin (PRAVACHOL) 10 MG tablet Take 1 tablet (10 mg total) by mouth daily. 90 tablet 1   tiZANidine (ZANAFLEX) 2 MG tablet Take 2 mg by mouth 3 (three) times daily as needed.     fluticasone (FLONASE) 50 MCG/ACT nasal spray SHAKE LIQUID AND USE 2 SPRAYS IN EACH NOSTRIL DAILY (Patient not taking: Reported on 06/06/2022) 48 g 1   No facility-administered medications prior to visit.    ROS Review of Systems  Constitutional:  Positive for appetite change and unexpected weight change. Negative for chills, diaphoresis, fatigue and fever.  HENT:   Positive for postnasal drip, rhinorrhea, sinus pressure and sinus pain. Negative for facial swelling, nosebleeds, sore throat and trouble swallowing.   Eyes: Negative.   Respiratory:  Negative for cough, chest tightness, shortness of breath and wheezing.   Cardiovascular:  Negative for chest pain, palpitations and leg swelling.  Gastrointestinal:  Negative for abdominal pain, constipation, diarrhea, nausea and vomiting.  Endocrine: Negative.   Genitourinary: Negative.  Negative for difficulty urinating.  Musculoskeletal: Negative.  Negative for back pain and myalgias.  Skin: Negative.  Negative for color change and rash.  Neurological:  Positive for numbness. Negative for dizziness, weakness, light-headedness and headaches.  Hematological:  Negative for adenopathy. Does not bruise/bleed easily.  Psychiatric/Behavioral:  Positive for dysphoric mood and sleep disturbance. Negative for confusion, decreased concentration and suicidal ideas. The patient is nervous/anxious.     Objective:  BP 132/86 (BP Location: Right Arm, Patient Position: Sitting, Cuff Size: Large)   Pulse 69   Temp 98.1 F (36.7 C) (Oral)   Ht '5\' 8"'$  (1.727 m)   Wt 152 lb 8 oz (69.2 kg)   SpO2 99%   BMI 23.19 kg/m   BP Readings from Last 3 Encounters:  06/06/22 132/86  03/06/22 136/86  02/21/22 (!) 145/94    Wt Readings from Last 3 Encounters:  06/06/22 152 lb 8 oz (69.2 kg)  03/06/22 161 lb (73 kg)  02/21/22 167 lb (  75.8 kg)    Physical Exam Vitals reviewed.  Constitutional:      Appearance: Normal appearance.  HENT:     Mouth/Throat:     Mouth: Mucous membranes are moist.  Eyes:     General: No scleral icterus.    Conjunctiva/sclera: Conjunctivae normal.  Cardiovascular:     Rate and Rhythm: Normal rate and regular rhythm.     Heart sounds: No murmur heard. Pulmonary:     Effort: Pulmonary effort is normal.     Breath sounds: No stridor. No wheezing, rhonchi or rales.  Abdominal:     General:  Abdomen is flat.     Palpations: There is no mass.     Tenderness: There is no abdominal tenderness. There is no guarding.     Hernia: No hernia is present.  Musculoskeletal:        General: Normal range of motion.     Cervical back: Neck supple.     Right lower leg: No edema.     Left lower leg: No edema.  Lymphadenopathy:     Cervical: No cervical adenopathy.  Skin:    General: Skin is warm and dry.  Neurological:     General: No focal deficit present.     Mental Status: She is alert. Mental status is at baseline.     Cranial Nerves: No cranial nerve deficit.     Sensory: No sensory deficit.     Motor: No weakness.     Coordination: Coordination is intact. Coordination normal. Rapid alternating movements normal.     Gait: Gait normal.     Deep Tendon Reflexes: Reflexes normal.  Psychiatric:        Mood and Affect: Mood normal.        Behavior: Behavior normal.     Lab Results  Component Value Date   WBC 5.7 06/06/2022   HGB 13.5 06/06/2022   HCT 41.1 06/06/2022   PLT 375.0 06/06/2022   GLUCOSE 83 06/06/2022   CHOL 150 09/13/2021   TRIG 71.0 09/13/2021   HDL 55.40 09/13/2021   LDLCALC 80 09/13/2021   ALT 14 06/06/2022   AST 18 06/06/2022   NA 137 06/06/2022   K 4.1 06/06/2022   CL 103 06/06/2022   CREATININE 0.80 06/06/2022   BUN 9 06/06/2022   CO2 26 06/06/2022   TSH 2.08 06/06/2022   HGBA1C 5.6 09/13/2021    CT Maxillofacial Wo Contrast  Result Date: 02/21/2022 CLINICAL DATA:  Facial trauma right face. EXAM: CT MAXILLOFACIAL WITHOUT CONTRAST TECHNIQUE: Multidetector CT imaging of the maxillofacial structures was performed. Multiplanar CT image reconstructions were also generated. RADIATION DOSE REDUCTION: This exam was performed according to the departmental dose-optimization program which includes automated exposure control, adjustment of the mA and/or kV according to patient size and/or use of iterative reconstruction technique. COMPARISON:  None. FINDINGS:  Osseous: Negative for facial fracture Orbits: Negative for orbital fracture.  No orbital mass or edema. Sinuses: Mild mucosal edema paranasal sinuses.  No air-fluid level. Soft tissues: No soft tissue swelling or hematoma. Dermal calcifications. Limited intracranial: Negative IMPRESSION: No acute abnormality.  Negative for facial fracture. Electronically Signed   By: Franchot Gallo M.D.   On: 02/21/2022 19:53     Assessment & Plan:   [name] was seen today for hypertension, depression and sinusitis.  Diagnoses and all orders for this visit:  Essential hypertension- Her blood pressure is well controlled. -     Hepatic function panel; Future -  CBC with Differential/Platelet; Future -     CBC with Differential/Platelet -     Hepatic function panel  Allergic rhinitis, unspecified seasonality, unspecified trigger  GAD (generalized anxiety disorder) -     mirtazapine (REMERON) 7.5 MG tablet; Take 1 tablet (7.5 mg total) by mouth at bedtime.  Diuretic-induced hypokalemia  Numbness and tingling of upper and lower extremities of both sides- I will treat the thiamine deficiency. -     Basic metabolic panel; Future -     Vitamin B12; Future -     TSH; Future -     Vitamin B1; Future -     Folate; Future -     CBC with Differential/Platelet; Future -     CBC with Differential/Platelet -     Folate -     Vitamin B1 -     TSH -     Vitamin B12 -     Basic metabolic panel  Current severe episode of major depressive disorder without psychotic features without prior episode (HCC) -     TSH; Future -     mirtazapine (REMERON) 7.5 MG tablet; Take 1 tablet (7.5 mg total) by mouth at bedtime. -     TSH  Acute non-recurrent maxillary sinusitis -     sulfamethoxazole-trimethoprim (BACTRIM DS) 800-160 MG tablet; Take 1 tablet by mouth 2 (two) times daily for 10 days.  Thiamine deficiency neuropathy -     thiamine (VITAMIN B1) 100 MG tablet; Take 1 tablet (100 mg total) by mouth daily.   I  am having [name] start on mirtazapine, sulfamethoxazole-trimethoprim, and thiamine. I am also having her maintain her tiZANidine, pravastatin, indapamide, Linzess, levocetirizine, fluticasone, Klor-Con M15, HYDROcodone-acetaminophen, ALPRAZolam, and amLODipine.  Meds ordered this encounter  Medications   mirtazapine (REMERON) 7.5 MG tablet    Sig: Take 1 tablet (7.5 mg total) by mouth at bedtime.    Dispense:  90 tablet    Refill:  0   sulfamethoxazole-trimethoprim (BACTRIM DS) 800-160 MG tablet    Sig: Take 1 tablet by mouth 2 (two) times daily for 10 days.    Dispense:  20 tablet    Refill:  0   thiamine (VITAMIN B1) 100 MG tablet    Sig: Take 1 tablet (100 mg total) by mouth daily.    Dispense:  90 tablet    Refill:  1     Follow-up: Return in about 3 months (around 09/06/2022).  Scarlette Calico, MD

## 2022-06-10 NOTE — Progress Notes (Deleted)
Office Visit    Patient Name: [name] Date of Encounter: 06/10/2022  PCP:  Janith Lima, MD   [facility]  Cardiologist:  Rozann Lesches, MD  Advanced Practice Provider:  No care team member to display Electrophysiologist:  None   HPI    [name] is a 61 y.o. female with past medical history significant for GERD, palpitations, migraine headache, PVCs presents today for annual follow-up appointment.  She was last seen 02/2021 for an evaluation of dizziness.  She stated at that time she felt like her lightheadedness was related to an episode of sinusitis that was successfully treated with antibiotics and steroids.  She had also been using Flonase and is back to her baseline.  Unrelated to that event, she had intermittent palpitations that were short-lived and not associated with any lightheadedness.  She did not have any syncope or near syncope.  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 Caryl Comes, last seen in 2012.  She had not required any medications for several years.  She quit smoking 3 years ago.   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 Left ventricular ejection fraction, by estimation, is 60 to 65%. The  left ventricle has normal function. The left ventricle has no regional  wall motion abnormalities. Left ventricular diastolic parameters are  consistent with Grade I diastolic  dysfunction (impaired relaxation).   2. Right ventricular systolic function is normal. The right ventricular  size is normal. There is normal pulmonary artery systolic pressure.   3. The mitral valve is normal in structure. No evidence of mitral valve  regurgitation. No evidence of mitral stenosis.   4. The aortic valve is tricuspid. Aortic valve regurgitation is not  visualized. No aortic stenosis is present.   5. The inferior vena cava is normal in size with greater than 50%  respiratory variability, suggesting right atrial pressure of 3 mmHg.   FINDINGS   Left Ventricle: Left ventricular ejection fraction, by estimation, is 60  to 65%. The left ventricle has normal function. The left ventricle has no  regional wall motion abnormalities. The left ventricular internal cavity  size was normal in size. There is   no left ventricular hypertrophy. Left ventricular diastolic parameters  are consistent with Grade I diastolic dysfunction (impaired relaxation).   Right Ventricle: The right ventricular size is normal. No increase in  right ventricular wall thickness.  Right ventricular systolic function is  normal. There is normal pulmonary artery systolic pressure. The tricuspid  regurgitant velocity is 2.29 m/s, and   with an assumed right atrial pressure of 3 mmHg, the estimated right  ventricular systolic pressure is 26.8 mmHg.   Left Atrium: Left atrial size was normal in size.   Right Atrium: Right atrial size was normal in size.   Pericardium: There is no evidence of pericardial effusion.   Mitral Valve: The mitral valve is normal in  structure. No evidence of  mitral valve regurgitation. No evidence of mitral valve stenosis.   Tricuspid Valve: The tricuspid valve is normal in structure. Tricuspid  valve regurgitation is mild . No evidence of tricuspid stenosis.   Aortic Valve: The aortic valve is tricuspid. Aortic valve regurgitation is  not visualized. No aortic stenosis is present. Aortic valve mean gradient  measures 3.6 mmHg. Aortic valve peak gradient measures 7.1 mmHg. Aortic  valve area, by VTI measures 2.08  cm.   Pulmonic Valve: The pulmonic valve was not well visualized. Pulmonic valve  regurgitation is mild. No evidence of pulmonic stenosis.   Aorta: The aortic root is normal in size and structure.   Pulmonary Artery: 21.   Venous: The inferior vena cava is normal in size with greater than 50%  respiratory variability, suggesting right atrial pressure of 3 mmHg.   IAS/Shunts: No atrial level shunt detected by color flow Doppler.      Zio monitor 03/23/21  ZIO XT reviewed.  4 days 3 hours analyzed.  Predominant rhythm is sinus with heart rate ranging from 47 bpm up to 152 bpm and average heart rate 86 bpm.  There were rare PACs including couplets and triplets representing less than 1% total beats.  There were rare PVCs including couplets representing less than 1% total beats.  No sustained arrhythmias or pauses.  Carotid Dopplers 02/23/2021: Summary:  Right Carotid: There is no evidence of stenosis in the right ICA.   Left Carotid: There is no evidence of stenosis in the left ICA. The  extracranial                vessels were near-normal with only minimal wall thickening  or                plaque.   Vertebrals:  Bilateral vertebral arteries demonstrate antegrade flow.  Subclavians: Normal flow hemodynamics were seen in bilateral subclavian               arteries.   EKG:  EKG is *** ordered today.  The ekg ordered today demonstrates ***  Recent Labs: 12/19/2021: Magnesium 2.0 06/06/2022: ALT  14; BUN 9; Creatinine, Ser 0.80; Hemoglobin 13.5; Platelets 375.0; Potassium 4.1; Sodium 137; TSH 2.08  Recent Lipid Panel    Component Value Date/Time   CHOL 150 09/13/2021 1203   TRIG 71.0 09/13/2021 1203   HDL 55.40 09/13/2021 1203   CHOLHDL 3 09/13/2021 1203   VLDL 14.2 09/13/2021 1203   LDLCALC 80 09/13/2021 1203   LDLCALC 118 (H) 07/07/2020 1130    Risk Assessment/Calculations:  {Does this patient have ATRIAL FIBRILLATION?:[id number]}  Home Medications   No outpatient medications have been marked as taking for the 06/11/22 encounter (Appointment) with Elgie Collard, PA-C.     Review of Systems   ***   All other systems reviewed and are otherwise negative except as noted above.  Physical Exam    VS:  There were no vitals taken for this visit. ,  BMI There is no height or weight on file to calculate BMI.  Wt Readings from Last 3 Encounters:  06/06/22 152 lb 8 oz (69.2 kg)  03/06/22 161 lb (73 kg)  02/21/22 167 lb (75.8 kg)     GEN: Well nourished, well developed, in no acute distress. HEENT: normal. Neck: Supple, no JVD, carotid bruits, or masses. Cardiac: ***RRR, no murmurs, rubs, or gallops. No clubbing, cyanosis, edema.  ***Radials/PT 2+ and equal bilaterally.  Respiratory:  ***Respirations regular and unlabored, clear to auscultation bilaterally. GI: Soft, nontender, nondistended. MS: No deformity or atrophy. Skin: Warm and dry, no rash. Neuro:  Strength and sensation are intact. Psych: Normal affect.  Assessment & Plan    PVCs/palpitations -monitor results reassuring and listed above  No BP recorded.  {Refresh Note OR Click here to enter BP  :1}***      Disposition: Follow up {follow up:15908} with Rozann Lesches, MD or APP.  Signed, Elgie Collard, PA-C 06/10/2022, 4:38 PM [facility]

## 2022-06-11 ENCOUNTER — Ambulatory Visit: Payer: Medicare Other | Admitting: Internal Medicine

## 2022-06-11 ENCOUNTER — Ambulatory Visit: Payer: Medicare Other | Admitting: Physician Assistant

## 2022-06-11 DIAGNOSIS — R42 Dizziness and giddiness: Secondary | ICD-10-CM

## 2022-06-11 DIAGNOSIS — J0101 Acute recurrent maxillary sinusitis: Secondary | ICD-10-CM | POA: Insufficient documentation

## 2022-06-11 DIAGNOSIS — E5111 Dry beriberi: Secondary | ICD-10-CM | POA: Insufficient documentation

## 2022-06-11 DIAGNOSIS — I493 Ventricular premature depolarization: Secondary | ICD-10-CM

## 2022-06-11 DIAGNOSIS — J0100 Acute maxillary sinusitis, unspecified: Secondary | ICD-10-CM | POA: Insufficient documentation

## 2022-06-11 DIAGNOSIS — I4729 Other ventricular tachycardia: Secondary | ICD-10-CM

## 2022-06-11 LAB — VITAMIN B1: Vitamin B1 (Thiamine): 7 nmol/L — ABNORMAL LOW (ref 8–30)

## 2022-06-11 MED ORDER — THIAMINE HCL 100 MG PO TABS: 100.0000 mg | ORAL_TABLET | Freq: Every day | ORAL | 1 refills | Status: DC

## 2022-06-12 ENCOUNTER — Encounter: Payer: Self-pay | Admitting: Internal Medicine

## 2022-06-12 DIAGNOSIS — J309 Allergic rhinitis, unspecified: Secondary | ICD-10-CM

## 2022-06-12 DIAGNOSIS — E5111 Dry beriberi: Secondary | ICD-10-CM

## 2022-06-12 MED ORDER — THIAMINE HCL 100 MG PO TABS: 100.0000 mg | ORAL_TABLET | Freq: Every day | ORAL | 1 refills | Status: DC

## 2022-06-12 MED ORDER — LEVOCETIRIZINE DIHYDROCHLORIDE 5 MG PO TABS: 5.0000 mg | ORAL_TABLET | Freq: Every evening | ORAL | 1 refills | Status: DC

## 2022-06-12 NOTE — Addendum Note (Signed)
Addended by: Earnstine Regal on: 06/12/2022 03:10 PM   Modules accepted: Orders

## 2022-06-12 NOTE — Telephone Encounter (Signed)
Called pt inform her Thiamine was sent in on yesterday. She said [facility] said they never received. I have resent the Thiamine again...[name]

## 2022-06-12 NOTE — Addendum Note (Signed)
Addended by: Earnstine Regal on: 06/12/2022 03:46 PM   Modules accepted: Orders

## 2022-07-23 ENCOUNTER — Ambulatory Visit
Admission: RE | Admit: 2022-07-23 | Discharge: 2022-07-23 | Disposition: A | Discharge status: Home / Self Care | Payer: Medicare Other | Source: Ambulatory Visit | Attending: Acute Care | Admitting: Acute Care

## 2022-07-23 DIAGNOSIS — Z87891 Personal history of nicotine dependence: Secondary | ICD-10-CM

## 2022-07-25 ENCOUNTER — Other Ambulatory Visit: Payer: Self-pay | Admitting: Acute Care

## 2022-07-25 DIAGNOSIS — Z87891 Personal history of nicotine dependence: Secondary | ICD-10-CM

## 2022-07-25 DIAGNOSIS — Z122 Encounter for screening for malignant neoplasm of respiratory organs: Secondary | ICD-10-CM

## 2022-09-17 ENCOUNTER — Other Ambulatory Visit: Payer: Self-pay | Admitting: Internal Medicine

## 2022-09-17 DIAGNOSIS — F322 Major depressive disorder, single episode, severe without psychotic features: Secondary | ICD-10-CM

## 2022-09-17 DIAGNOSIS — F411 Generalized anxiety disorder: Secondary | ICD-10-CM

## 2022-09-19 ENCOUNTER — Encounter: Payer: Self-pay | Admitting: Gastroenterology

## 2022-10-08 ENCOUNTER — Other Ambulatory Visit: Payer: Self-pay | Admitting: Internal Medicine

## 2022-10-08 ENCOUNTER — Encounter: Payer: Self-pay | Admitting: Gastroenterology

## 2022-10-08 DIAGNOSIS — Z1231 Encounter for screening mammogram for malignant neoplasm of breast: Secondary | ICD-10-CM

## 2022-11-03 ENCOUNTER — Other Ambulatory Visit: Payer: Self-pay | Admitting: Internal Medicine

## 2022-11-03 DIAGNOSIS — I10 Essential (primary) hypertension: Secondary | ICD-10-CM

## 2022-11-08 ENCOUNTER — Encounter: Payer: Self-pay | Admitting: Cardiology

## 2022-11-08 ENCOUNTER — Ambulatory Visit: Payer: Medicare Other | Attending: Cardiology | Admitting: Cardiology

## 2022-11-08 VITALS — BP 126/84 | HR 89 | Ht 68.0 in | Wt 156.2 lb

## 2022-11-08 DIAGNOSIS — Z720 Tobacco use: Secondary | ICD-10-CM

## 2022-11-08 DIAGNOSIS — I2510 Atherosclerotic heart disease of native coronary artery without angina pectoris: Secondary | ICD-10-CM | POA: Diagnosis not present

## 2022-11-08 DIAGNOSIS — I4729 Other ventricular tachycardia: Secondary | ICD-10-CM | POA: Diagnosis not present

## 2022-11-08 NOTE — Patient Instructions (Signed)
Medication Instructions:  Your physician recommends that you continue on your current medications as directed. Please refer to the Current Medication list given to you today.   Labwork: None today  Testing/Procedures: None today  Follow-Up: 1 year  Any Other Special Instructions Will Be Listed Below (If Applicable).  If you need a refill on your cardiac medications before your next appointment, please call your pharmacy.  

## 2022-11-08 NOTE — Progress Notes (Signed)
Cardiology Office Note  Date: 11/08/2022   ID: [name], DOB 01/15/1961, MRN [id number]  PCP:  Janith Lima, MD  Cardiologist:  Rozann Lesches, MD Electrophysiologist:  None   Chief Complaint  Patient presents with   Cardiac follow-up    History of Present Illness: [name] is a 62 y.o. female last seen in May 2022.  She presents overdue for follow-up.  She tells me that she had an awful 01/16/2022.  Her husband died in Nov 18, 2022 and her son was killed over the summer.  She has been smoking more, now a pack and 1/2/day.  Plans to see her PCP on Monday to discuss smoking cessation options.  May also benefit from some grief counseling.  From a cardiac perspective, she does not report any progressive palpitations, still has these occasionally.  No chest pain or sudden syncope.  Cardiac monitor from June 2022 demonstrated less than 1% PVC burden.  Echocardiogram at that time revealed LVEF 60 to 65%, normal RV contraction.  I personally reviewed her ECG today which shows sinus rhythm with possible right atrial enlargement.  I see that she had a screening chest CT in September showing no concerning pulmonary findings but incidentally noted aortic and coronary artery calcification.  She has not had a lipid panel in over a year.  Previous LDL was 80.  She had been on Pravachol 10 mg daily but came off of this at least a year ago.  Reportedly, she was tolerating it.  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     indapamide (LOZOL) 2.5 MG tablet TAKE 1 TABLET(2.5 MG) BY MOUTH DAILY 90 tablet 0   levocetirizine (XYZAL) 5 MG tablet Take 1 tablet (5 mg total) by mouth every evening. 90 tablet 1   LINZESS 145 MCG CAPS capsule Take 1 capsule (145 mcg total) by mouth daily before breakfast. 90 each 1   mirtazapine (REMERON) 7.5 MG tablet TAKE 1 TABLET(7.5 MG) BY MOUTH AT BEDTIME 90 tablet 0   thiamine (VITAMIN B1) 100 MG tablet Take 1 tablet (100 mg total) by mouth daily. 90 tablet 1   tiZANidine (ZANAFLEX) 2 MG tablet Take 2 mg by mouth 3 (three) times daily as needed.     No current facility-administered medications for this visit.   Allergies:  Livalo [pitavastatin], Anesthesia s-i-40 [propofol], Augmentin [amoxicillin-pot clavulanate], and Doxycycline   ROS: No orthopnea or PND.  Physical Exam: VS:  BP 126/84   Pulse 89   Ht '5\' 8"'$  (1.727 m)   Wt 156 lb 3.2 oz (70.9 kg)   SpO2 99%   BMI 23.75 kg/m , BMI Body mass index is 23.75 kg/m.  Wt Readings from Last 3 Encounters:  11/08/22 156 lb 3.2 oz (70.9  kg)  06/06/22 152 lb 8 oz (69.2 kg)  03/06/22 161 lb (73 kg)    General: Patient appears comfortable at rest. HEENT: Conjunctiva and lids normal. Neck: Supple, no elevated JVP or carotid bruits. Lungs: Clear to auscultation, nonlabored breathing at rest. Cardiac: Regular rate and rhythm, no S3 or significant systolic murmur, no pericardial rub.  ECG:  An ECG dated 02/12/2021 was personally reviewed today and demonstrated:  Sinus rhythm with nonspecific T wave changes, rule out right atrial enlargement.  Recent Labwork: 12/19/2021: Magnesium 2.0 06/06/2022: ALT 14; AST 18; BUN 9; Creatinine, Ser 0.80; Hemoglobin 13.5; Platelets 375.0; Potassium 4.1; Sodium 137; TSH 2.08     Component Value  Date/Time   CHOL 150 09/13/2021 1203   TRIG 71.0 09/13/2021 1203   HDL 55.40 09/13/2021 1203   CHOLHDL 3 09/13/2021 1203   VLDL 14.2 09/13/2021 1203   LDLCALC 80 09/13/2021 1203   LDLCALC 118 (H) 07/07/2020 1130    Other Studies Reviewed Today:  Cardiac monitor June 2022: ZIO XT reviewed. 4 days 3 hours analyzed. Predominant rhythm is sinus with heart rate ranging from 47 bpm up to 152 bpm and average heart rate 86 bpm. There were rare PACs including couplets and triplets representing less than 1% total beats. There were rare PVCs including couplets representing less than 1% total beats. No sustained arrhythmias or pauses.   Echocardiogram 04/25/2021:  1. Left ventricular ejection fraction, by estimation, is 60 to 65%. The  left ventricle has normal function. The left ventricle has no regional  wall motion abnormalities. Left ventricular diastolic parameters are  consistent with Grade I diastolic  dysfunction (impaired relaxation).   2. Right ventricular systolic function is normal. The right ventricular  size is normal. There is normal pulmonary artery systolic pressure.   3. The mitral valve is normal in structure. No evidence of mitral valve  regurgitation. No evidence of mitral stenosis.   4. The aortic valve is tricuspid. Aortic valve regurgitation is not  visualized. No aortic stenosis is present.   5. The inferior vena cava is normal in size with greater than 50%  respiratory variability, suggesting right atrial pressure of 3 mmHg.   Chest CT 07/23/2022: IMPRESSION: 1. Lung-RADS 2, benign appearance or behavior. Continue annual screening with low-dose chest CT without contrast in 12 months. 2. Aortic atherosclerosis (ICD10-I70.0). Coronary artery calcification. 3.  Emphysema (ICD10-J43.9).  Assessment and Plan:  1.  History of RVOT PVCs.  On Sectral and then pindolol years past, but has not required specific therapy more recently.  Cardiac monitor from June 2022 showed  less than 1% PVC burden.  Echocardiogram at that time showed normal LV and RV contraction.  I reviewed her ECG today.  Will continue with observation for now.  2.  Incidentally noted coronary artery and aortic calcification by CT imaging from September 2023.  I have recommended that she get a follow-up lipid panel with her PCP and then resume statin therapy.  Could consider resumption of low-dose Pravachol as before, although higher dose may be indicated depending on her LDL.  3.  Tobacco abuse, plans to discuss smoking cessation medication options with PCP.  May also benefit from further grief counseling as per above discussion.  Medication Adjustments/Labs and Tests Ordered: Current medicines are reviewed at length with the patient today.  Concerns regarding medicines are outlined above.   Tests Ordered: Orders Placed This Encounter  Procedures   EKG 12-Lead    Medication Changes: No orders of the defined types  were placed in this encounter.   Disposition:  Follow up  1 year.  Signed, Satira Sark, MD, [facility] 11/08/2022 11:45 AM    [place] [facility] [address], [place], [place] [address] Phone: [phone]; Fax: [phone]

## 2022-11-09 ENCOUNTER — Other Ambulatory Visit: Payer: Self-pay | Admitting: Internal Medicine

## 2022-11-09 DIAGNOSIS — J309 Allergic rhinitis, unspecified: Secondary | ICD-10-CM

## 2022-11-12 ENCOUNTER — Ambulatory Visit (INDEPENDENT_AMBULATORY_CARE_PROVIDER_SITE_OTHER): Payer: Medicare Other | Admitting: Internal Medicine

## 2022-11-12 ENCOUNTER — Encounter: Payer: Self-pay | Admitting: Internal Medicine

## 2022-11-12 VITALS — BP 134/86 | HR 77 | Temp 98.0°F | Ht 68.0 in | Wt 156.0 lb

## 2022-11-12 DIAGNOSIS — R202 Paresthesia of skin: Secondary | ICD-10-CM

## 2022-11-12 DIAGNOSIS — E5111 Dry beriberi: Secondary | ICD-10-CM | POA: Diagnosis not present

## 2022-11-12 DIAGNOSIS — F322 Major depressive disorder, single episode, severe without psychotic features: Secondary | ICD-10-CM

## 2022-11-12 DIAGNOSIS — I10 Essential (primary) hypertension: Secondary | ICD-10-CM | POA: Diagnosis not present

## 2022-11-12 DIAGNOSIS — Z0000 Encounter for general adult medical examination without abnormal findings: Secondary | ICD-10-CM | POA: Diagnosis not present

## 2022-11-12 DIAGNOSIS — Z1231 Encounter for screening mammogram for malignant neoplasm of breast: Secondary | ICD-10-CM

## 2022-11-12 DIAGNOSIS — Z720 Tobacco use: Secondary | ICD-10-CM

## 2022-11-12 DIAGNOSIS — R200 Anesthesia of skin: Secondary | ICD-10-CM | POA: Diagnosis not present

## 2022-11-12 DIAGNOSIS — F411 Generalized anxiety disorder: Secondary | ICD-10-CM

## 2022-11-12 DIAGNOSIS — E785 Hyperlipidemia, unspecified: Secondary | ICD-10-CM | POA: Diagnosis not present

## 2022-11-12 DIAGNOSIS — Z23 Encounter for immunization: Secondary | ICD-10-CM

## 2022-11-12 DIAGNOSIS — Z0001 Encounter for general adult medical examination with abnormal findings: Secondary | ICD-10-CM | POA: Insufficient documentation

## 2022-11-12 LAB — LIPID PANEL
Cholesterol: 186 mg/dL (ref 0–200)
HDL: 67.4 mg/dL (ref >39.00)
LDL Cholesterol: 102 mg/dL — ABNORMAL HIGH (ref 0–99)
NonHDL: 118.66
Total CHOL/HDL Ratio: 3
Triglycerides: 81 mg/dL (ref 0.0–149.0)
VLDL: 16.2 mg/dL (ref 0.0–40.0)

## 2022-11-12 LAB — URINALYSIS, ROUTINE W REFLEX MICROSCOPIC
Bilirubin Urine: NEGATIVE
Hgb urine dipstick: NEGATIVE
Ketones, ur: NEGATIVE
Leukocytes,Ua: NEGATIVE
Nitrite: NEGATIVE
RBC / HPF: NONE SEEN (ref 0–?)
Specific Gravity, Urine: <=1.005 — AB (ref 1.000–1.030)
Total Protein, Urine: NEGATIVE
Urine Glucose: NEGATIVE
Urobilinogen, UA: 0.2 (ref 0.0–1.0)
WBC, UA: NONE SEEN (ref 0–?)
pH: 6 (ref 5.0–8.0)

## 2022-11-12 LAB — BASIC METABOLIC PANEL
BUN: 12 mg/dL (ref 6–23)
CO2: 27 mEq/L (ref 19–32)
Calcium: 9.7 mg/dL (ref 8.4–10.5)
Chloride: 104 mEq/L (ref 96–112)
Creatinine, Ser: 0.8 mg/dL (ref 0.40–1.20)
GFR: 79.42 mL/min (ref >60.00)
Glucose, Bld: 103 mg/dL — ABNORMAL HIGH (ref 70–99)
Potassium: 4.3 mEq/L (ref 3.5–5.1)
Sodium: 139 mEq/L (ref 135–145)

## 2022-11-12 MED ORDER — ALPRAZOLAM 0.5 MG PO TABS: 0.5000 mg | ORAL_TABLET | Freq: Every day | ORAL | 1 refills | Status: DC

## 2022-11-12 MED ORDER — VARENICLINE TARTRATE (STARTER) 0.5 MG X 11 & 1 MG X 42 PO TBPK: ORAL_TABLET | ORAL | 0 refills | Status: DC

## 2022-11-12 MED ORDER — VARENICLINE TARTRATE 1 MG PO TABS: 1.0000 mg | ORAL_TABLET | Freq: Two times a day (BID) | ORAL | 0 refills | Status: DC

## 2022-11-12 NOTE — Patient Instructions (Signed)

## 2022-11-12 NOTE — Progress Notes (Signed)
Subjective:  Patient ID: [name], female    DOB: 09-Mar-1961  Age: 62 y.o. MRN: [id number]  CC: Annual Exam, Hyperlipidemia, and Hypertension   HPI [name] presents for a CPX and follow-up -  She is taking the B1 supplement but continues to c/o N/T in her extremities. She is ready clinic quit smoking and wants to use nicotine.   Outpatient Medications Prior to Visit  Medication Sig Dispense Refill   amLODipine (NORVASC) 5 MG tablet TAKE 1 TABLET(5 MG) BY MOUTH DAILY 90 tablet 1   fluticasone (FLONASE) 50 MCG/ACT nasal spray SHAKE LIQUID AND USE 2 SPRAYS IN EACH NOSTRIL DAILY 48 g 1   HYDROcodone-acetaminophen (NORCO/VICODIN) 5-325 MG tablet Take 1 tablet by mouth every 6 (six) hours as needed for moderate pain.     indapamide (LOZOL) 2.5 MG tablet TAKE 1 TABLET(2.5 MG) BY MOUTH DAILY 90 tablet 0   levocetirizine (XYZAL) 5 MG tablet TAKE 1 TABLET(5 MG) BY MOUTH EVERY EVENING 90 tablet 1   LINZESS 145 MCG CAPS capsule Take 1 capsule (145 mcg total) by mouth daily before breakfast. 90 each 1   mirtazapine (REMERON) 7.5 MG tablet TAKE 1 TABLET(7.5 MG) BY MOUTH AT BEDTIME 90 tablet 0   thiamine (VITAMIN B1) 100 MG tablet Take 1 tablet (100 mg total) by mouth daily. (Patient not taking: Reported on 11/15/2022) 90 tablet 1   tiZANidine (ZANAFLEX) 2 MG tablet Take 2 mg by mouth 3 (three) times daily as needed.     ALPRAZolam (XANAX) 0.5 MG tablet TAKE 1 TABLET BY MOUTH AT BEDTIME 90 tablet 4   No facility-administered medications prior to visit.    ROS Review of Systems  Constitutional: Negative.  Negative for diaphoresis and fatigue.  HENT: Negative.    Eyes: Negative.   Respiratory:  Negative for cough, chest tightness, shortness of breath and wheezing.   Cardiovascular:  Negative for chest pain, palpitations and leg swelling.  Gastrointestinal:  Negative for abdominal pain, constipation, diarrhea and vomiting.  Endocrine: Negative.   Genitourinary: Negative.   Negative for difficulty urinating.  Musculoskeletal: Negative.   Skin: Negative.   Neurological:  Positive for numbness. Negative for dizziness and weakness.  Hematological: Negative.   Psychiatric/Behavioral:  Negative for confusion, decreased concentration, dysphoric mood, hallucinations, sleep disturbance and suicidal ideas. The patient is nervous/anxious.     Objective:  BP 134/86 (BP Location: Left Arm, Patient Position: Sitting, Cuff Size: Large)   Pulse 77   Temp 98 F (36.7 C) (Oral)   Ht '5\' 8"'$  (1.727 m)   Wt 156 lb (70.8 kg)   SpO2 97%   BMI 23.72 kg/m   BP Readings from Last 3 Encounters:  11/12/22 134/86  11/08/22 126/84  06/06/22 132/86    Wt Readings from Last 3 Encounters:  11/15/22 156 lb (70.8 kg)  11/12/22 156 lb (70.8 kg)  11/08/22 156 lb 3.2 oz (70.9 kg)    Physical Exam Vitals reviewed.  Constitutional:      Appearance: She is not ill-appearing.  HENT:     Nose: Nose normal.     Mouth/Throat:     Mouth: Mucous membranes are moist.  Eyes:     General: No scleral icterus.    Conjunctiva/sclera: Conjunctivae normal.  Cardiovascular:     Rate and Rhythm: Normal rate and regular rhythm.     Heart sounds: No murmur heard. Pulmonary:     Effort: Pulmonary effort is normal.     Breath sounds: No stridor. No  wheezing, rhonchi or rales.  Abdominal:     General: Abdomen is flat.     Palpations: There is no mass.     Tenderness: There is no abdominal tenderness. There is no guarding.     Hernia: No hernia is present.  Musculoskeletal:        General: Normal range of motion.     Cervical back: Neck supple.     Right lower leg: No edema.     Left lower leg: No edema.  Lymphadenopathy:     Cervical: No cervical adenopathy.  Skin:    General: Skin is warm and dry.     Coloration: Skin is not pale.     Findings: No lesion or rash.  Neurological:     General: No focal deficit present.     Mental Status: She is alert.  Psychiatric:        Attention  and Perception: She is inattentive.        Mood and Affect: Mood is anxious. Mood is not depressed. Affect is tearful. Affect is not labile.        Speech: Speech normal.        Behavior: Behavior normal.        Thought Content: Thought content normal. Thought content is not paranoid or delusional. Thought content does not include homicidal or suicidal ideation.     Lab Results  Component Value Date   WBC 5.7 06/06/2022   HGB 13.5 06/06/2022   HCT 41.1 06/06/2022   PLT 375.0 06/06/2022   GLUCOSE 103 (H) 11/12/2022   CHOL 186 11/12/2022   TRIG 81.0 11/12/2022   HDL 67.40 11/12/2022   LDLCALC 102 (H) 11/12/2022   ALT 14 06/06/2022   AST 18 06/06/2022   NA 139 11/12/2022   K 4.3 11/12/2022   CL 104 11/12/2022   CREATININE 0.80 11/12/2022   BUN 12 11/12/2022   CO2 27 11/12/2022   TSH 2.08 06/06/2022   HGBA1C 5.6 09/13/2021    CT CHEST LUNG CA SCREEN LOW DOSE W/O CM  Result Date: 07/24/2022 CLINICAL DATA:  Former smoker, 39 pack-year history, quit 2 years ago. EXAM: CT CHEST WITHOUT CONTRAST LOW-DOSE FOR LUNG CANCER SCREENING TECHNIQUE: Multidetector CT imaging of the chest was performed following the standard protocol without IV contrast. RADIATION DOSE REDUCTION: This exam was performed according to the departmental dose-optimization program which includes automated exposure control, adjustment of the mA and/or kV according to patient size and/or use of iterative reconstruction technique. COMPARISON:  06/20/2020. FINDINGS: Cardiovascular: Atherosclerotic calcification of the aorta and coronary arteries. Heart size normal. No pericardial effusion. Mediastinum/Nodes: No pathologically enlarged mediastinal or axillary lymph nodes. Hilar regions are difficult to definitively evaluate without IV contrast. Esophagus is grossly unremarkable. Lungs/Pleura: Centrilobular and paraseptal emphysema. 2.4 mm right upper lobe nodule, as before. No new or suspicious pulmonary nodules. No pleural fluid.  Airway is unremarkable. Upper Abdomen: Visualized portions of the liver, adrenal glands, kidneys, spleen, pancreas, stomach and bowel are grossly unremarkable. Cholecystectomy. Musculoskeletal: No worrisome lytic or sclerotic lesions. A lipoma is seen in the left shoulder musculature. IMPRESSION: 1. Lung-RADS 2, benign appearance or behavior. Continue annual screening with low-dose chest CT without contrast in 12 months. 2. Aortic atherosclerosis (ICD10-I70.0). Coronary artery calcification. 3.  Emphysema (ICD10-J43.9). Electronically Signed   By: Lorin Picket M.D.   On: 07/24/2022 12:16   Assessment & Plan:   Larisha was seen today for annual exam, hyperlipidemia and hypertension.  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  Numbness and tingling of upper and lower extremities of both sides -     Ambulatory referral to Neurology  Essential hypertension- Her BP is well controlled. -     Basic metabolic panel; Future -     Urinalysis, Routine w reflex microscopic; Future -     Urinalysis, Routine w reflex microscopic -     Basic metabolic panel  Dyslipidemia, goal LDL below 100- She will not take a statin -     Lipid panel; Future -     Lipid panel  Visit for screening mammogram -     MM DIGITAL SCREENING BILATERAL; Future  Current severe episode of major depressive disorder without psychotic features without prior episode [facility])- She wants to continue the current antidepressants.  Encounter for general adult medical examination with abnormal findings- Exam completed, labs reviewed, vaccines reviewed and updated, cancer screenings addressed, patient education was given.  GAD (generalized anxiety disorder) -     ALPRAZolam (XANAX) 0.5 MG tablet; Take 1 tablet (0.5 mg total) by mouth at bedtime.  Tobacco abuse -     varenicline (CHANTIX CONTINUING MONTH PAK) 1 MG tablet; Take 1 tablet (1 mg total) by mouth 2 (two)  times daily. (Patient not taking: Reported on 11/15/2022) -     Varenicline Tartrate, Starter, (CHANTIX STARTING MONTH PAK) 0.5 MG X 11 & 1 MG X 42 TBPK; Take one 0.5 mg tablet by mouth once daily for 3 days, then increase to one 0.5 mg tablet twice daily for 4 days, then increase to one 1 mg tablet twice daily. (Patient not taking: Reported on 11/15/2022)  Need for vaccination -     Pneumococcal conjugate vaccine 20-valent  Other orders -     Flu Vaccine QUAD 6+ mos PF IM (Fluarix Quad PF)   I have changed [name]'s ALPRAZolam. I am also having her start on varenicline and Varenicline Tartrate (Starter). Additionally, I am having her maintain her tiZANidine, indapamide, Linzess, fluticasone, HYDROcodone-acetaminophen, thiamine, mirtazapine, amLODipine, and levocetirizine.  Meds ordered this encounter  Medications   ALPRAZolam (XANAX) 0.5 MG tablet    Sig: Take 1 tablet (0.5 mg total) by mouth at bedtime.    Dispense:  90 tablet    Refill:  1   varenicline (CHANTIX CONTINUING MONTH PAK) 1 MG tablet    Sig: Take 1 tablet (1 mg total) by mouth 2 (two) times daily.    Dispense:  180 tablet    Refill:  0   Varenicline Tartrate, Starter, (CHANTIX STARTING MONTH PAK) 0.5 MG X 11 & 1 MG X 42 TBPK    Sig: Take one 0.5 mg tablet by mouth once daily for 3 days, then increase to one 0.5 mg tablet twice daily for 4 days, then increase to one 1 mg tablet twice daily.    Dispense:  53 each    Refill:  0     Follow-up: Return in about 6 months (around 05/13/2023).  Scarlette Calico, MD

## 2022-11-15 ENCOUNTER — Ambulatory Visit (AMBULATORY_SURGERY_CENTER): Payer: Medicare Other

## 2022-11-15 VITALS — Ht 70.5 in | Wt 156.0 lb

## 2022-11-15 DIAGNOSIS — Z86010 Personal history of colonic polyps: Secondary | ICD-10-CM

## 2022-11-15 MED ORDER — NA SULFATE-K SULFATE-MG SULF 17.5-3.13-1.6 GM/177ML PO SOLN: 1.0000 | Freq: Once | ORAL | 0 refills | Status: AC

## 2022-11-15 NOTE — Progress Notes (Signed)
Pre visit completed via phone call; Patient verified name, DOB, and address;  No egg or soy allergy known to patient  No issues known to pt with past sedation with any surgeries or procedures Patient denies ever being told they had issues or difficulty with intubation  No FH of Malignant Hyperthermia Pt is not on diet pills Pt is not on home 02  Pt is not on blood thinners  Pt reports issues with constipation - "due to the pain medication that I take"-takes Linzess maybe every other day No A fib or A flutter Have any cardiac testing pending--NO Pt instructed to use Singlecare[employer]com or GoodRx for a price reduction on prep   Insurance verified during [place] appt=[employer]  Patient's chart reviewed by Osvaldo Angst CNRA prior to previsit and patient appropriate for the [place].  Previsit completed and red dot placed by patient's name on their procedure day (on provider's schedule).    GoodRx coupon for [employer] sent with instructions via mail to patient

## 2022-11-21 ENCOUNTER — Ambulatory Visit
Admission: RE | Admit: 2022-11-21 | Discharge: 2022-11-21 | Disposition: A | Discharge status: Home / Self Care | Payer: Medicare Other | Source: Ambulatory Visit | Attending: Internal Medicine | Admitting: Internal Medicine

## 2022-11-21 DIAGNOSIS — Z1231 Encounter for screening mammogram for malignant neoplasm of breast: Secondary | ICD-10-CM

## 2022-11-30 ENCOUNTER — Encounter: Payer: Self-pay | Admitting: Gastroenterology

## 2022-12-04 ENCOUNTER — Ambulatory Visit: Payer: Medicare Other

## 2022-12-11 ENCOUNTER — Encounter: Payer: Medicare Other | Admitting: Gastroenterology

## 2022-12-28 ENCOUNTER — Other Ambulatory Visit: Payer: Self-pay | Admitting: Internal Medicine

## 2022-12-28 DIAGNOSIS — E785 Hyperlipidemia, unspecified: Secondary | ICD-10-CM

## 2022-12-28 DIAGNOSIS — I700 Atherosclerosis of aorta: Secondary | ICD-10-CM

## 2022-12-28 MED ORDER — PRAVASTATIN SODIUM 10 MG PO TABS: 10.0000 mg | ORAL_TABLET | Freq: Every day | ORAL | 1 refills | Status: DC

## 2022-12-28 NOTE — Progress Notes (Unsigned)
Lab Results  Component Value Date   WBC 5.7 06/06/2022   HGB 13.5 06/06/2022   HCT 41.1 06/06/2022   PLT 375.0 06/06/2022   GLUCOSE 103 (H) 11/12/2022   CHOL 186 11/12/2022   TRIG 81.0 11/12/2022   HDL 67.40 11/12/2022   LDLCALC 102 (H) 11/12/2022   ALT 14 06/06/2022   AST 18 06/06/2022   NA 139 11/12/2022   K 4.3 11/12/2022   CL 104 11/12/2022   CREATININE 0.80 11/12/2022   BUN 12 11/12/2022   CO2 27 11/12/2022   TSH 2.08 06/06/2022   HGBA1C 5.6 09/13/2021

## 2022-12-31 ENCOUNTER — Other Ambulatory Visit: Payer: Self-pay | Admitting: Internal Medicine

## 2022-12-31 DIAGNOSIS — J309 Allergic rhinitis, unspecified: Secondary | ICD-10-CM

## 2023-01-01 ENCOUNTER — Encounter: Payer: Self-pay | Admitting: Neurology

## 2023-01-01 ENCOUNTER — Ambulatory Visit (INDEPENDENT_AMBULATORY_CARE_PROVIDER_SITE_OTHER): Payer: Medicare Other | Admitting: Neurology

## 2023-01-01 ENCOUNTER — Telehealth: Payer: Self-pay | Admitting: Neurology

## 2023-01-01 VITALS — BP 113/72 | HR 77 | Ht 65.5 in | Wt 156.6 lb

## 2023-01-01 DIAGNOSIS — E519 Thiamine deficiency, unspecified: Secondary | ICD-10-CM | POA: Diagnosis not present

## 2023-01-01 DIAGNOSIS — E531 Pyridoxine deficiency: Secondary | ICD-10-CM

## 2023-01-01 DIAGNOSIS — M5412 Radiculopathy, cervical region: Secondary | ICD-10-CM

## 2023-01-01 DIAGNOSIS — R202 Paresthesia of skin: Secondary | ICD-10-CM

## 2023-01-01 DIAGNOSIS — E538 Deficiency of other specified B group vitamins: Secondary | ICD-10-CM

## 2023-01-01 DIAGNOSIS — R292 Abnormal reflex: Secondary | ICD-10-CM

## 2023-01-01 DIAGNOSIS — G959 Disease of spinal cord, unspecified: Secondary | ICD-10-CM

## 2023-01-01 DIAGNOSIS — R7309 Other abnormal glucose: Secondary | ICD-10-CM

## 2023-01-01 DIAGNOSIS — M6281 Muscle weakness (generalized): Secondary | ICD-10-CM

## 2023-01-01 DIAGNOSIS — Z9889 Other specified postprocedural states: Secondary | ICD-10-CM

## 2023-01-01 DIAGNOSIS — R258 Other abnormal involuntary movements: Secondary | ICD-10-CM

## 2023-01-01 DIAGNOSIS — M5382 Other specified dorsopathies, cervical region: Secondary | ICD-10-CM

## 2023-01-01 NOTE — Progress Notes (Addendum)
[facility]    Provider:  Dr Lucia Gaskins Requesting Provider: Etta Grandchild, MD Primary Care Provider:  Etta Grandchild, MD  CC:  paresthesias in the arms and legs  Addendum Dr. Lucia Gaskins 03/06/2023: Please call patient. There is nothing new in the cervical spine. BUT there is old damage in the cervical cord due to her previous cervical problems and that could be the cause of her paresthesias in her body. If she has had these symptoms since her surgery that is probably the answer, old damage I the cervical spine. If not we can order an emg/ncs to further examine. 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 I would wait, she has follow up with Amy in July, and if she isn't better with b1 supplementation then Amy can order an emg/ncs thanks (Her B1 was 7 in 05/2022 (normal 8-30) which is severe almost undetectable. She told me she had stopped taking her B1 supplement when I saw her for appointment. Her B1 came back within normal limits when I retested it. 12/2022 But she was severely b1 deficient for who knows how long and that can cause paresthesias and neuropathy. She will see Amy in July and if still having paresthesias then Amy can order an emg/ncs, she has an MRI lumbar spine pending from another doctor as well. )  MRI c-spine IMPRESSION: 1. Previous fusion from C3 through C7. Solid union with sufficient patency of the canal and foramina presently. 2. 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 No apparent progressive change. 3. C2-3: Endplate osteophytes and bulging of the disc. Mild canal narrowing but no cord compression. Question if there is fusion of the facet joints at this level. 4. C7-T1: Endplate osteophytes and bulging of the disc. Narrowing of the ventral subarachnoid space but no compression of the cord. Moderate bilateral foraminal narrowing which could possibly affect either C8 nerve. No definite progression since 2016.  HPI:  [name] is  a 62 y.o. female here as requested by Etta Grandchild, MD for numbness and tingling. PMHx migraine, HTN, gerd, constipation, thiamine neuropathy, degenerative disease lumbar, numbness nd tingling upper and lower extremities, GAD.  I reviewed Dr. Barnett Applebaum notes.  Patient continues to take B1 supplementation but still complains of numbness and tingling in her arms and legs.  Prior notes state a lot of stress, her son was murdered in 2023 unfortunately and at that time she had a 55-month history of numbness and tingling sensation symmetrically in both upper and lower extremities.  Dr. 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  She still c/o numbness and tingling in the feet and the legs. It feels like a million needles in the legs and the feet. She is not taking her B1, she jus stopped it, getting worse since stopping the b1, has not taken B1 in months. She took about 100 pills so 3 months so hasn't taken in about 6 months, she has lost 50 pounds, discussed The most common effect of thiamin deficiency is beriberi, which is characterized mainly by peripheral neuropathy and wasting and she has lost 50 pounds. We will recheck all vitamin levels and ask her to start thiamine 100mg .  Pain in the neck, ongoing for years, crepitus, arm numbness, imbalance/ataxia reported need to MRI cervical spine as well.    Reviewed notes, labs and imaging from outside physicians, which showed:  Vimain B1 quite low at 7  05/2022  MRI brain 05/05/2017: EXAM: MRI Brain with and without contrast with pituitary views   ORDERING CLINICIAN: Naomie Dean M.D. CLINICAL HISTORY: 62 year old woman with a pituitary abnormality on imaging study COMPARISON FILMS: 10/11/2007 brain and 02/28/2013 cervical   TECHNIQUE:MRI of the brain with and without contrast was obtained utilizing 5 mm axial slices with T1, T2, T2 flair, SWI and diffusion weighted views.  T1 sagittal, and postcontrast  views in the axial and coronal plane were obtained.  Additional post contrast  sagittal and dynamic postcontrast coronal images were performed. CONTRAST: 15 ml [facility] IMAGING SITE: [facility], [address] [place].   FINDINGS: On sagittal images, the spinal cord is imaged caudally to C3 and is normal in caliber.   The contents of the posterior fossa are of normal size and position.      The pituitary gland has a normal contour and optic chiasm appear normal.  There is a 3 mm round focus in the superior posterior pituitary gland. It does not enhance after contrast demonstration.   It was not clearly present on the 2008 brain MRI but is seen on the T2-weighted sagittal images of the cervical spine 02/28/2013.  Brain volume appears normal.   The ventricles are normal in size and without distortion.  There are no abnormal extra-axial collections of fluid.     The cerebellum and brainstem appears normal.   The deep gray matter appears normal.  There is a single T2/FLAIR hyperintense adjacent to the basal ganglia on the left that was also present on the 2008 MRI.Marland Kitchen   Diffusion weighted images are normal.  Susceptibility weighted images are normal.      The orbits appear normal.   The VIIth/VIIIth nerve complex appears normal.  The mastoid air cells appear normal.  The paranasal sinuses appear normal.  Flow voids are identified within the major intracerebral arteries.     After the infusion of contrast material, a normal enhancement pattern is noted.     IMPRESSION:    This MRI of the brain with and without contrast with additional postcontrast pituitary views shows the following: 1.   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   It is not clearly present on the standard brain MRI from 2008 and appears unchanged when compared to the T2-weighted sagittal images from the 2014 cervical spine MRI. 2.   Single small T2/flair hyperintense focus in the external  capsule on the left that was also noted on the 2008 MRI.  Though nonspecific it could represent a small focus of chronic microvascular ischemic change. 3.   There are no acute findings and there is a normal enhancement pattern.    Review of Systems: Patient complains of symptoms per HPI as well as the following symptoms paresthesias. Pertinent negatives and positives per HPI. All others negative.   Social History   Socioeconomic History   Marital status: Married    Spouse name: Not on file   Number of children: 2   Years of education: 12   Highest education level: Not on file  Occupational History   Occupation: Retired/disabled  Tobacco Use   Smoking status: Every Day    Packs/day: 2.00    Years: 36.00    Total pack years: 72.00    Types: Cigarettes    Last attempt to quit: 09/2019    Years since quitting: 3.2   Smokeless tobacco: Never  Vaping Use   Vaping Use: Never used  Substance and Sexual Activity   Alcohol use: No   Drug use: No   Sexual activity: Yes    Partners: Male  Other Topics Concern   Not on file  Social History Narrative   Occupation: retired [employer]   Widowed  (lost husband, son and sister since last seen) 01-01-2023   Alcohol no   Drug use no, no caffiene   Tobacco use- yes quit Dec '08   Regular Exercise no   4 children; reports increased stress in due to caring for grandchildren   UNEMPLOYED      Lives at home    Right-handed   Caffeine: 1-2 cup of coffee    Social Determinants of Health   Financial Resource Strain: Low Risk  (12/19/2021)   Overall Financial Resource Strain (CARDIA)    Difficulty of Paying Living Expenses: Not hard at all  Food Insecurity: No Food Insecurity (12/19/2021)   Hunger Vital Sign    Worried About Running Out of Food in the Last Year: Never true    Ran Out of Food in the Last Year: Never true  Transportation Needs: No Transportation Needs (12/19/2021)   PRAPARE - Doctor, general practice (Medical): No    Lack of Transportation (Non-Medical): No  Physical Activity: Inactive (12/19/2021)   Exercise Vital Sign    Days of Exercise per Week: 0 days    Minutes of Exercise per Session: 0 min  Stress: No Stress Concern Present (12/19/2021)   [employer] of Occupational Health - Occupational Stress Questionnaire    Feeling of Stress : Not at all  Social Connections: Moderately Integrated (12/19/2021)   Social Connection and Isolation Panel [NHANES]    Frequency of Communication with Friends and Family: More than three times a week    Frequency of Social Gatherings with Friends and Family: More than three times a week    Attends Religious Services: More than 4 times per year    Active Member of [employer] or Organizations: Yes    Attends Banker Meetings: More than 4 times per year    Marital Status: Widowed  Intimate Partner Violence: Not At Risk (12/19/2021)   Humiliation, Afraid, Rape, and Kick questionnaire    Fear of Current or Ex-Partner: No    Emotionally Abused: No    Physically Abused: No    Sexually Abused: No    Family History  Problem Relation Age of Onset   Diabetes Mother    Multiple sclerosis Mother    Heart disease Father    Hypertension Sister    Kidney disease Sister    Colon cancer Neg Hx    Colon polyps Neg Hx    Esophageal cancer Neg Hx    Rectal cancer Neg Hx    Stomach cancer Neg Hx    Breast cancer Neg Hx     Past Medical History:  Diagnosis Date   Anxiety    on meds   Depression    on meds   DJD (degenerative joint disease)    back/neck   GERD (gastroesophageal reflux disease)    History of palpitations    Migraine headache    Neuromuscular disorder (HCC)    on meds   Occipital neuralgia    PVC's (premature ventricular contractions)    Originating from [facility]   Seasonal allergies     Patient Active Problem List   Diagnosis Date Noted   Encounter for general adult medical examination with abnormal  findings 11/12/2022   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   Numbness and tingling of upper and lower extremities of both sides 06/06/2022   Current severe episode of major depressive disorder without psychotic features without prior episode (HCC) 06/06/2022   Tobacco abuse 09/15/2021   Chronic idiopathic constipation 09/15/2021   Diuretic-induced hypokalemia 09/13/2021   DDD (degenerative disc disease), lumbar 03/07/2021   Screening for cervical cancer 07/07/2020   Essential hypertension 03/01/2020   Atherosclerosis of aorta (HCC) 03/31/2019   Dyslipidemia, goal LDL below 100 03/31/2019   Visit for screening mammogram 03/31/2019   Allergic rhinitis 02/22/2017   Overweight (BMI 25.0-29.9) 03/22/2016   GAD (generalized anxiety disorder) 04/13/2010   PREMATURE VENTRICULAR CONTRACTIONS 02/06/2010   MIGRAINE HEADACHE 07/24/2007   GASTROESOPHAGEAL REFLUX DISEASE 07/24/2007    Past Surgical History:  Procedure Laterality Date   BACK SURGERY  10/23/2012   CERVICAL FUSION     CHOLECYSTECTOMY     COLONOSCOPY  2018   MS-suprep(exc)tics/hems-recall 5 yr   LAMINECTOMY     MRI     every 3 mths /due to menigitis   TOTAL ABDOMINAL HYSTERECTOMY     TUBAL LIGATION      Current Outpatient Medications  Medication Sig Dispense Refill   ALPRAZolam (XANAX) 0.5 MG tablet Take 1 tablet (0.5 mg total) by mouth at bedtime. 90 tablet 1   amLODipine (NORVASC) 5 MG tablet TAKE 1 TABLET(5 MG) BY MOUTH DAILY 90 tablet 1   fluticasone (FLONASE) 50 MCG/ACT nasal spray SHAKE LIQUID AND USE 2 SPRAYS IN EACH NOSTRIL DAILY 48 g 1   HYDROcodone-acetaminophen (NORCO/VICODIN) 5-325 MG tablet Take 1 tablet by mouth every 6 (six) hours as needed for moderate pain.     indapamide (LOZOL) 2.5 MG tablet TAKE 1 TABLET(2.5 MG) BY MOUTH DAILY 90 tablet 0   levocetirizine (XYZAL) 5 MG tablet TAKE 1 TABLET(5 MG) BY MOUTH EVERY EVENING 90 tablet 1   LINZESS 145 MCG CAPS capsule Take 1 capsule (145 mcg total) by mouth  daily before breakfast. 90 each 1   mirtazapine (REMERON) 7.5 MG tablet TAKE 1 TABLET(7.5 MG) BY MOUTH AT BEDTIME 90 tablet 0   pravastatin (PRAVACHOL) 10 MG tablet Take 1 tablet (10 mg total) by mouth daily. 90 tablet 1   thiamine (VITAMIN B1) 100 MG tablet Take 1 tablet (100 mg total) by mouth daily. 90 tablet 1   tiZANidine (ZANAFLEX) 2 MG tablet Take 2 mg by mouth 3 (three) times daily as needed.     varenicline (CHANTIX CONTINUING MONTH PAK) 1 MG tablet Take 1 tablet (1 mg total) by mouth 2 (two) times daily. 180 tablet 0   Varenicline Tartrate, Starter, (CHANTIX STARTING MONTH PAK) 0.5 MG X 11 & 1 MG X 42 TBPK Take one 0.5 mg tablet by mouth once daily for 3 days, then increase to one 0.5 mg tablet twice daily for 4 days, then increase to one 1 mg tablet twice daily. 53 each 0   No current facility-administered medications for this visit.    Allergies as of 01/01/2023 - Review Complete 01/01/2023  Allergen Reaction Noted   Livalo [pitavastatin] Other (See Comments) 10/06/2020   Anesthesia s-i-40 [propofol] Itching 04/17/2017   Augmentin [amoxicillin-pot clavulanate] Nausea And Vomiting 10/03/2016   Doxycycline  03/04/2020    Vitals: BP 113/72   Pulse 77   Ht 5' 5.5" (1.664 m)   Wt 156 lb 9.6 oz (71 kg)   BMI 25.66 kg/m  Last Weight:  Wt Readings from Last 1 Encounters:  01/01/23 156 lb 9.6 oz (71 kg)   Last Height:   Ht Readings from Last 1 Encounters:  01/01/23 5' 5.5" (1.664 m)     Physical exam: Exam: Gen: NAD, conversant, well nourised, obese, well groomed                     CV: RRR, no MRG. No Carotid Bruits. No peripheral edema, warm, nontender Eyes: Conjunctivae clear without exudates or hemorrhage  Neuro: Detailed Neurologic Exam  Speech:    Speech is normal; fluent and spontaneous with normal comprehension.  Cognition:    The patient is oriented to person, place, and time;     recent and remote memory intact;     language fluent;     normal  attention, concentration,     fund of knowledge Cranial Nerves:    The pupils are equal, round, and reactive to light. The fundi are flat. Extraocular movements are intact. Trigeminal sensation is intact and the muscles of mastication are normal. The face is symmetric. The palate elevates in the midline. Hearing intact. Voice is normal. Shoulder shrug is normal. The tongue has normal motion without fasciculations.   Coordination:    Normal finger to nose and heel to shin with imbalance  Gait:    Heel-toe and tandem gait with imalance   Motor Observation:    No asymmetry, no atrophy, and no involuntary movements noted. Tone:    Normal muscle tone.    Posture:    Posture is normal. normal erect    Strength: proximal weakness in arms and legs, otherwise strength is V/V in the upper and lower limbs.      Sensation: numbness to the knees and wrists to pin prick, vibration intact     Reflex Exam:  DTR's:    Deep tendon reflexes in the upper and lower extremities are brisk bilaterally.   Toes:    The toes are downgoing bilaterally.   Clonus: Clonus right Aj 2 beats    Assessment/Plan:  patient with paresthesias   Check blood work - she hs not been taking her vitamin B1 for her deficiency Start Thiamine 100mg  daily for life; The most common effect of thiamin deficiency is beriberi, which is characterized mainly by peripheral neuropathy and wasting and she has lost 50 pounds.we discussed in detail If not feeling better or worsening in 4-6 months order emg/ncs  MRi cervical spine: Neck is complteley fused, multiple surgeries, numbness in  arms and hands, proximal weakness in the arms, abnormal reflexes, clonus, decreased ROM neck, cervical radiculpathy, failed conservative treatments, had multiple fusions, need to check neck,   Addendum Dr. Lucia Gaskins 03/06/2023: Please call patient. There is nothing new in the cervical spine. BUT there is old damage in the cervical cord due to her previous  cervical problems and that could be the cause of her paresthesias in her body. If she has had these symptoms since her surgery that is probably the answer, old damage I the cervical spine. If not we can order an emg/ncs to further examine. 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 I would wait, she has follow up with Amy in July, and if she isn't better with b1 supplementation then Amy can order an emg/ncs thanks (Her B1 was 7 in 05/2022 (normal 8-30) which is severe almost undetectable. She told me she had stopped taking her B1 supplement when I saw her for appointment. Her B1 came back within normal limits when I retested it. 12/2022 But she  was severely b1 deficient for who knows how long and that can cause paresthesias and neuropathy. She will see Amy in July and if still having paresthesias then Amy can order an emg/ncs, she has an MRI lumbar spine pending from another doctor as well. )  MRI c-spine IMPRESSION: 1. Previous fusion from C3 through C7. Solid union with sufficient patency of the canal and foramina presently. 2. 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 No apparent progressive change. 3. C2-3: Endplate osteophytes and bulging of the disc. Mild canal narrowing but no cord compression. Question if there is fusion of the facet joints at this level. 4. C7-T1: Endplate osteophytes and bulging of the disc. Narrowing of the ventral subarachnoid space but no compression of the cord. Moderate bilateral foraminal narrowing which could possibly affect either C8 nerve. No definite progression since 2016.  Orders Placed This Encounter  Procedures   MR CERVICAL SPINE WO CONTRAST   Vitamin B1   B12 and Folate Panel   Methylmalonic acid, serum   Hemoglobin A1c   Vitamin B6   Multiple Myeloma Panel (SPEP&IFE w/QIG)   ANA Comprehensive Panel   Sjogren's syndrome antibods(ssa + ssb)   CK    Cc: Etta Grandchild, MD,  Etta Grandchild, MD  Naomie Dean, MD  [facility] [address] [place], [place] [address]  Phone [phone] Fax [phone]  I spent 70 minutes of face-to-face and non-face-to-face time with patient on the  1. Paresthesias   2. Vitamin B1 deficiency   3. screen for B12 deficiency   4. Elevated glucose   5. screen for Vitamin B6 deficiency   6. Muscle weakness   7. H/O excision of lamina of cervical vertebra for decompression of spinal cord   8. Decreased range of motion of intervertebral discs of cervical spine   9. Clonus   10. Abnormal DTR (deep tendon reflex)   11. Cervical radiculopathy   12. Cervical myelopathy with cervical radiculopathy (HCC)    diagnosis.  This included previsit chart review, lab review, study review, order entry, electronic health record documentation, patient education on the different diagnostic and therapeutic options, counseling and coordination of care, risks and benefits of management, compliance, or risk factor reduction

## 2023-01-01 NOTE — Patient Instructions (Addendum)
Check blood work Start Thiamine '100mg'$  daily for life; The most common effect of thiamin deficiency is beriberi, which is characterized mainly by peripheral neuropathy and wasting and she has lost 50 pounds. MRi cervical spine If not feeling better or worsening in 4-6 months order emg/ncs  Thiamin Deficiency In addition to insufficient intakes of thiamin from the diet, the causes of thiamin deficiency include lower absorption or higher excretion rates than normal due, for example, to certain conditions (such as alcohol dependence or HIV/AIDS) or use of some medications [3].  In its early stage, thiamin deficiency can cause weight loss and anorexia, confusion, short-term memory loss, and other mental signs and symptoms; muscle weakness; and cardiovascular symptoms (such as an enlarged heart) [7].  The most common effect of thiamin deficiency is beriberi, which is characterized mainly by peripheral neuropathy and wasting [1-3]. People with this condition have impaired sensory, motor, and reflex functions. In rare cases, beriberi causes congestive heart failure that leads to edema in the lower limbs and, occasionally, death [1,3]. Although beriberi is rare in the [place] and other developed countries, people in these countries do occasionally develop the condition [15-18]. Administration of supplemental thiamin, often parenterally, quickly cures beriberi [2,3].  A more common manifestation of thiamin deficiency in the [place] is Wernicke-Korsakoff syndrome [2]. This disorder is about 8-10 times more common in people with chronic alcoholism than in the general population, but it can also develop in patients who have severe gastrointestinal disorders, rapidly progressing hematologic malignancies, drug use disorders, or AIDS [2]. In many patients, Wernicke-Korsakoff syndrome has two phases. The first, acute, and life-threatening stage, Wernicke's encephalopathy, is usually characterized by  peripheral neuropathy [3,19]. Without treatment, up to 20% of people with Wernicke's encephalopathy die; those who survive develop Korsakoff's psychosis, although some people with Korsakoff's psychosis have not previously had Wernicke's encephalopathy [20,21]. Korsakoff's psychosis, an effect of chronic thiamin deficiency, is associated with severe short-term memory loss, disorientation, and confabulation (confusion between real and imagined memories) [1-3]. At this chronic state of the disorder, parenteral thiamin treatment does not lead to recovery in about one-quarter of patients [22].

## 2023-01-01 NOTE — Telephone Encounter (Signed)
[employer] medicare NPR sent to [facility] [phone]

## 2023-01-03 ENCOUNTER — Ambulatory Visit (INDEPENDENT_AMBULATORY_CARE_PROVIDER_SITE_OTHER): Payer: Medicare Other

## 2023-01-03 ENCOUNTER — Telehealth: Payer: Self-pay

## 2023-01-03 VITALS — Ht 68.0 in | Wt 156.0 lb

## 2023-01-03 DIAGNOSIS — Z0000 Encounter for general adult medical examination without abnormal findings: Secondary | ICD-10-CM | POA: Diagnosis not present

## 2023-01-03 NOTE — Patient Instructions (Signed)
[name] , Thank you for taking time to come for your Medicare Wellness Visit. I appreciate your ongoing commitment to your health goals. Please review the following plan we discussed and let me know if I can assist you in the future.   These are the goals we discussed:  Goals      My goal is to get back to being physically active by walking.        This is a list of the screening recommended for you and due dates:  Health Maintenance  Topic Date Due   Zoster (Shingles) Vaccine (2 of 2) 11/08/2021   COVID-19 Vaccine (4 - 2023-24 season) 06/29/2022   Colon Cancer Screening  09/02/2022   Screening for Lung Cancer  07/24/2023   Medicare Annual Wellness Visit  01/03/2024   Mammogram  11/21/2024   DTaP/Tdap/Td vaccine (3 - Td or Tdap) 02/22/2032   Flu Shot  Completed   Hepatitis C Screening: USPSTF Recommendation to screen - Ages 18-79 yo.  Completed   HIV Screening  Completed   HPV Vaccine  Aged Out    Advanced directives: No  Conditions/risks identified: Yes  Next appointment: Follow up in one year for your annual wellness visit.   Preventive Care 40-64 Years, Female Preventive care refers to lifestyle choices and visits with your health care provider that can promote health and wellness. What does preventive care include? A yearly physical exam. This is also called an annual well check. Dental exams once or twice a year. Routine eye exams. Ask your health care provider how often you should have your eyes checked. Personal lifestyle choices, including: Daily care of your teeth and gums. Regular physical activity. Eating a healthy diet. Avoiding tobacco and drug use. Limiting alcohol use. Practicing safe sex. Taking low-dose aspirin daily starting at age 45. Taking vitamin and mineral supplements as recommended by your health care provider. What happens during an annual well check? The services and screenings done by your health care provider during your annual well  check will depend on your age, overall health, lifestyle risk factors, and family history of disease. Counseling  Your health care provider may ask you questions about your: Alcohol use. Tobacco use. Drug use. Emotional well-being. Home and relationship well-being. Sexual activity. Eating habits. Work and work Statistician. Method of birth control. Menstrual cycle. Pregnancy history. Screening  You may have the following tests or measurements: Height, weight, and BMI. Blood pressure. Lipid and cholesterol levels. These may be checked every 5 years, or more frequently if you are over 62 years old. Skin check. Lung cancer screening. You may have this screening every year starting at age 71 if you have a 30-pack-year history of smoking and currently smoke or have quit within the past 15 years. Fecal occult blood test (FOBT) of the stool. You may have this test every year starting at age 64. Flexible sigmoidoscopy or colonoscopy. You may have a sigmoidoscopy every 5 years or a colonoscopy every 10 years starting at age 24. Hepatitis C blood test. Hepatitis B blood test. Sexually transmitted disease (STD) testing. Diabetes screening. This is done by checking your blood sugar (glucose) after you have not eaten for a while (fasting). You may have this done every 1-3 years. Mammogram. This may be done every 1-2 years. Talk to your health care provider about when you should start having regular mammograms. This may depend on whether you have a family history of breast cancer. BRCA-related cancer screening. This may be done if  you have a family history of breast, ovarian, tubal, or peritoneal cancers. Pelvic exam and Pap test. This may be done every 3 years starting at age 40. Starting at age 53, this may be done every 5 years if you have a Pap test in combination with an HPV test. Bone density scan. This is done to screen for osteoporosis. You may have this scan if you are at high risk for  osteoporosis. Discuss your test results, treatment options, and if necessary, the need for more tests with your health care provider. Vaccines  Your health care provider may recommend certain vaccines, such as: Influenza vaccine. This is recommended every year. Tetanus, diphtheria, and acellular pertussis (Tdap, Td) vaccine. You may need a Td booster every 10 years. Zoster vaccine. You may need this after age 16. Pneumococcal 13-valent conjugate (PCV13) vaccine. You may need this if you have certain conditions and were not previously vaccinated. Pneumococcal polysaccharide (PPSV23) vaccine. You may need one or two doses if you smoke cigarettes or if you have certain conditions. Talk to your health care provider about which screenings and vaccines you need and how often you need them. This information is not intended to replace advice given to you by your health care provider. Make sure you discuss any questions you have with your health care provider. Document Released: 11/11/2015 Document Revised: 07/04/2016 Document Reviewed: 08/16/2015 Elsevier Interactive Patient Education  2017 [place] Prevention in the Home Falls can cause injuries. They can happen to people of all ages. There are many things you can do to make your home safe and to help prevent falls. What can I do on the outside of my home? Regularly fix the edges of walkways and driveways and fix any cracks. Remove anything that might make you trip as you walk through a door, such as a raised step or threshold. Trim any bushes or trees on the path to your home. Use bright outdoor lighting. Clear any walking paths of anything that might make someone trip, such as rocks or tools. Regularly check to see if handrails are loose or broken. Make sure that both sides of any steps have handrails. Any raised decks and porches should have guardrails on the edges. Have any leaves, snow, or ice cleared regularly. Use sand or  salt on walking paths during winter. Clean up any spills in your garage right away. This includes oil or grease spills. What can I do in the bathroom? Use night lights. Install grab bars by the toilet and in the tub and shower. Do not use towel bars as grab bars. Use non-skid mats or decals in the tub or shower. If you need to sit down in the shower, use a plastic, non-slip stool. Keep the floor dry. Clean up any water that spills on the floor as soon as it happens. Remove soap buildup in the tub or shower regularly. Attach bath mats securely with double-sided non-slip rug tape. Do not have throw rugs and other things on the floor that can make you trip. What can I do in the bedroom? Use night lights. Make sure that you have a light by your bed that is easy to reach. Do not use any sheets or blankets that are too big for your bed. They should not hang down onto the floor. Have a firm chair that has side arms. You can use this for support while you get dressed. Do not have throw rugs and other things on the floor  that can make you trip. What can I do in the kitchen? Clean up any spills right away. Avoid walking on wet floors. Keep items that you use a lot in easy-to-reach places. If you need to reach something above you, use a strong step stool that has a grab bar. Keep electrical cords out of the way. Do not use floor polish or wax that makes floors slippery. If you must use wax, use non-skid floor wax. Do not have throw rugs and other things on the floor that can make you trip. What can I do with my stairs? Do not leave any items on the stairs. Make sure that there are handrails on both sides of the stairs and use them. Fix handrails that are broken or loose. Make sure that handrails are as long as the stairways. Check any carpeting to make sure that it is firmly attached to the stairs. Fix any carpet that is loose or worn. Avoid having throw rugs at the top or bottom of the stairs. If  you do have throw rugs, attach them to the floor with carpet tape. Make sure that you have a light switch at the top of the stairs and the bottom of the stairs. If you do not have them, ask someone to add them for you. What else can I do to help prevent falls? Wear shoes that: Do not have high heels. Have rubber bottoms. Are comfortable and fit you well. Are closed at the toe. Do not wear sandals. If you use a stepladder: Make sure that it is fully opened. Do not climb a closed stepladder. Make sure that both sides of the stepladder are locked into place. Ask someone to hold it for you, if possible. Clearly mark and make sure that you can see: Any grab bars or handrails. First and last steps. Where the edge of each step is. Use tools that help you move around (mobility aids) if they are needed. These include: Canes. Walkers. Scooters. Crutches. Turn on the lights when you go into a dark area. Replace any light bulbs as soon as they burn out. Set up your furniture so you have a clear path. Avoid moving your furniture around. If any of your floors are uneven, fix them. If there are any pets around you, be aware of where they are. Review your medicines with your doctor. Some medicines can make you feel dizzy. This can increase your chance of falling. Ask your doctor what other things that you can do to help prevent falls. This information is not intended to replace advice given to you by your health care provider. Make sure you discuss any questions you have with your health care provider. Document Released: 08/11/2009 Document Revised: 03/22/2016 Document Reviewed: 11/19/2014 Elsevier Interactive Patient Education  2017 [employer].

## 2023-01-03 NOTE — Telephone Encounter (Signed)
Called patient lvm to return call, to complete AWV at [phone].  If no return call within 15 minutes, patient may reschedule for the next available appointment with [facility] or [facility]MA.  Willim Turnage N. Yehonatan Grandison, LPN. [place] Team Direct Dial: [phone]

## 2023-01-03 NOTE — Telephone Encounter (Signed)
AWV-S was completed by phone.  [name] N. Haidynn Almendarez, LPN. [place] Team Direct Dial: [phone]

## 2023-01-03 NOTE — Progress Notes (Signed)
I connected with  [name] on 01/03/23 by a audio enabled telemedicine application and verified that I am speaking with the correct person using two identifiers.  Patient Location: Home  Provider Location: Office/Clinic  I discussed the limitations of evaluation and management by telemedicine. The patient expressed understanding and agreed to proceed.  Subjective:   [name] is a 62 y.o. female who presents for Medicare Annual (Subsequent) preventive examination.  Review of Systems     Cardiac Risk Factors include: dyslipidemia;family history of premature cardiovascular disease;smoking/ tobacco exposure;sedentary lifestyle     Objective:    Today's Vitals   01/03/23 1610  Weight: 156 lb (70.8 kg)  Height: '5\' 8"'$  (1.727 m)  PainSc: 0-No pain   Body mass index is 23.72 kg/m.     01/03/2023    4:12 PM 02/21/2022    6:33 PM 12/19/2021    1:36 PM 12/15/2020   12:40 PM 08/20/2017    2:43 PM 02/21/2017    3:04 AM 07/26/2016   11:19 AM  Advanced Directives  Does Patient Have a Medical Advance Directive? No No No No No No No  Would patient like information on creating a medical advance directive? No - Patient declined No - Patient declined No - Patient declined Yes (MAU/Ambulatory/Procedural Areas - Information given)   No - patient declined information    Current Medications (verified) Outpatient Encounter Medications as of 01/03/2023  Medication Sig   ALPRAZolam (XANAX) 0.5 MG tablet Take 1 tablet (0.5 mg total) by mouth at bedtime.   amLODipine (NORVASC) 5 MG tablet TAKE 1 TABLET(5 MG) BY MOUTH DAILY   fluticasone (FLONASE) 50 MCG/ACT nasal spray SHAKE LIQUID AND USE 2 SPRAYS IN EACH NOSTRIL DAILY   HYDROcodone-acetaminophen (NORCO/VICODIN) 5-325 MG tablet Take 1 tablet by mouth every 6 (six) hours as needed for moderate pain.   indapamide (LOZOL) 2.5 MG tablet TAKE 1 TABLET(2.5 MG) BY MOUTH DAILY   levocetirizine (XYZAL) 5 MG tablet TAKE 1 TABLET(5 MG) BY MOUTH  EVERY EVENING   LINZESS 145 MCG CAPS capsule Take 1 capsule (145 mcg total) by mouth daily before breakfast.   mirtazapine (REMERON) 7.5 MG tablet TAKE 1 TABLET(7.5 MG) BY MOUTH AT BEDTIME   pravastatin (PRAVACHOL) 10 MG tablet Take 1 tablet (10 mg total) by mouth daily.   thiamine (VITAMIN B1) 100 MG tablet Take 1 tablet (100 mg total) by mouth daily.   tiZANidine (ZANAFLEX) 2 MG tablet Take 2 mg by mouth 3 (three) times daily as needed.   varenicline (CHANTIX CONTINUING MONTH PAK) 1 MG tablet Take 1 tablet (1 mg total) by mouth 2 (two) times daily.   Varenicline Tartrate, Starter, (CHANTIX STARTING MONTH PAK) 0.5 MG X 11 & 1 MG X 42 TBPK Take one 0.5 mg tablet by mouth once daily for 3 days, then increase to one 0.5 mg tablet twice daily for 4 days, then increase to one 1 mg tablet twice daily.   No facility-administered encounter medications on file as of 01/03/2023.    Allergies (verified) Livalo [pitavastatin], Anesthesia s-i-40 [propofol], Augmentin [amoxicillin-pot clavulanate], and Doxycycline   History: Past Medical History:  Diagnosis Date   Anxiety    on meds   Depression    on meds   DJD (degenerative joint disease)    back/neck   GERD (gastroesophageal reflux disease)    History of palpitations    Migraine headache    Neuromuscular disorder (HCC)    on meds   Occipital neuralgia  PVC's (premature ventricular contractions)    Originating from [facility]   Seasonal allergies    Past Surgical History:  Procedure Laterality Date   BACK SURGERY  10/23/2012   CERVICAL FUSION     CHOLECYSTECTOMY     COLONOSCOPY  2018   MS-suprep(exc)tics/hems-recall 5 yr   LAMINECTOMY     MRI     every 3 mths /due to menigitis   TOTAL ABDOMINAL HYSTERECTOMY     TUBAL LIGATION     Family History  Problem Relation Age of Onset   Diabetes Mother    Multiple sclerosis Mother    Heart disease Father    Hypertension Sister    Kidney disease Sister    Colon cancer Neg Hx    Colon  polyps Neg Hx    Esophageal cancer Neg Hx    Rectal cancer Neg Hx    Stomach cancer Neg Hx    Breast cancer Neg Hx    Social History   Socioeconomic History   Marital status: Married    Spouse name: Not on file   Number of children: 2   Years of education: 12   Highest education level: Not on file  Occupational History   Occupation: Retired/disabled  Tobacco Use   Smoking status: Every Day    Packs/day: 2.00    Years: 36.00    Total pack years: 72.00    Types: Cigarettes    Last attempt to quit: 09/2019    Years since quitting: 3.2   Smokeless tobacco: Never  Vaping Use   Vaping Use: Never used  Substance and Sexual Activity   Alcohol use: No   Drug use: No   Sexual activity: Yes    Partners: Male  Other Topics Concern   Not on file  Social History Narrative   Occupation: retired [employer]   Widowed  (lost husband, son and sister since last seen) 01-01-2023   Alcohol no   Drug use no, no caffiene   Tobacco use- yes quit Dec '08   Regular Exercise no   4 children; reports increased stress in due to caring for grandchildren   UNEMPLOYED      Lives at home    Right-handed   Caffeine: 1-2 cup of coffee    Social Determinants of Health   Financial Resource Strain: Low Risk  (01/03/2023)   Overall Financial Resource Strain (CARDIA)    Difficulty of Paying Living Expenses: Not hard at all  Food Insecurity: No Food Insecurity (01/03/2023)   Hunger Vital Sign    Worried About Running Out of Food in the Last Year: Never true    Ran Out of Food in the Last Year: Never true  Transportation Needs: No Transportation Needs (01/03/2023)   PRAPARE - Hydrologist (Medical): No    Lack of Transportation (Non-Medical): No  Physical Activity: Inactive (01/03/2023)   Exercise Vital Sign    Days of Exercise per Week: 0 days    Minutes of Exercise per Session: 0 min  Stress: No Stress Concern Present (01/03/2023)   [place]    Feeling of Stress : Not at all  Social Connections: Moderately Integrated (12/19/2021)   Social Connection and Isolation Panel [NHANES]    Frequency of Communication with Friends and Family: More than three times a week    Frequency of Social Gatherings with Friends and Family: More than three times a week  Attends Religious Services: More than 4 times per year    Active Member of Clubs or Organizations: Yes    Attends Archivist Meetings: More than 4 times per year    Marital Status: Widowed    Tobacco Counseling Ready to quit: Not Answered Counseling given: Not Answered   Clinical Intake:  Pre-visit preparation completed: Yes  Pain : No/denies pain Pain Score: 0-No pain     BMI - recorded: 23.72 Nutritional Status: BMI of 19-24  Normal Nutritional Risks: None Diabetes: No  How often do you need to have someone help you when you read instructions, pamphlets, or other written materials from your doctor or pharmacy?: 1 - Never What is the last grade level you completed in school?: HSG  Diabetic? No  Interpreter Needed?: No  Information entered by :: Lisette Abu, LPN.   Activities of Daily Living    01/03/2023    4:24 PM  In your present state of health, do you have any difficulty performing the following activities:  Hearing? 0  Vision? 0  Difficulty concentrating or making decisions? 0  Walking or climbing stairs? 0  Dressing or bathing? 0  Doing errands, shopping? 0  Preparing Food and eating ? N  Using the Toilet? N  In the past six months, have you accidently leaked urine? N  Do you have problems with loss of bowel control? N  Managing your Medications? N  Managing your Finances? N  Housekeeping or managing your Housekeeping? N    Patient Care Team: Janith Lima, MD as PCP - General (Internal Medicine) Satira Sark, MD as PCP - Cardiology (Cardiology) Szabat, Darnelle Maffucci, [facility] (Inactive) as Pharmacist (Pharmacist)  Indicate any recent Medical Services you may have received from other than [facility] providers in the past year (date may be approximate).     Assessment:   This is a routine wellness examination for [facility].  Hearing/Vision screen Hearing Screening - Comments:: Denies hearing difficulties   Vision Screening - Comments:: Wears rx glasses - up to date with routine eye exams with [facility])   Dietary issues and exercise activities discussed: Current Exercise Habits: The patient does not participate in regular exercise at present, Exercise limited by: psychological condition(s);respiratory conditions(s);orthopedic condition(s)   Goals Addressed             This Visit's Progress    My goal is to get back to being physically active by walking.        Depression Screen    01/03/2023    4:21 PM 11/12/2022    9:45 AM 06/06/2022    9:59 AM 12/19/2021    1:21 PM 02/14/2021   10:14 AM 12/15/2020   12:41 PM 10/06/2020    8:48 AM  PHQ 2/9 Scores  PHQ - 2 Score '4 4 4 '$ 0 0 0 0  PHQ- 9 Score '10 10 12        '$ Fall Risk    01/03/2023    4:13 PM 06/06/2022    9:58 AM 12/19/2021    1:37 PM 02/14/2021   10:14 AM 12/15/2020   12:40 PM  Fall Risk   Falls in the past year? 0 0 0 0 0  Number falls in past yr: 0 0 0 0 0  Injury with Fall? 0 0 0 0 0  Risk for fall due to : No Fall Risks No Fall Risks No Fall Risks No Fall Risks No Fall Risks  Follow up Falls prevention discussed Falls  evaluation completed Falls evaluation completed Falls evaluation completed     FALL RISK PREVENTION PERTAINING TO THE HOME:  Any stairs in or around the home? No  If so, are there any without handrails? No  Home free of loose throw rugs in walkways, pet beds, electrical cords, etc? Yes  Adequate lighting in your home to reduce risk of falls? Yes   ASSISTIVE DEVICES UTILIZED TO PREVENT FALLS:  Life alert? No  Use of a cane, walker or w/c? No  Grab bars in the  bathroom? Yes  Shower chair or bench in shower? Yes  Elevated toilet seat or a handicapped toilet? Yes   TIMED UP AND GO:  Was the test performed? No . Telephone Visit   Cognitive Function:        01/03/2023    4:24 PM 12/19/2021    1:37 PM  6CIT Screen  What Year? 0 points 0 points  What month? 0 points 0 points  What time? 0 points 0 points  Count back from 20 0 points 0 points  Months in reverse 0 points 0 points  Repeat phrase 0 points 0 points  Total Score 0 points 0 points    Immunizations Immunization History  Administered Date(s) Administered   Influenza Split 11/24/2012   Influenza,inj,Quad PF,6+ Mos 08/20/2014, 12/02/2015, 08/20/2017, 06/22/2019, 07/07/2020, 09/13/2021, 11/12/2022   PFIZER(Purple Top)SARS-COV-2 Vaccination 01/02/2020, 02/02/2020, 10/10/2020   PNEUMOCOCCAL CONJUGATE-20 11/12/2022   Tdap 09/09/2014, 02/21/2022   Zoster Recombinat (Shingrix) 09/13/2021    TDAP status: Up to date  Flu Vaccine status: Up to date  Pneumococcal vaccine status: Due, Education has been provided regarding the importance of this vaccine. Advised may receive this vaccine at local pharmacy or Health Dept. Aware to provide a copy of the vaccination record if obtained from local pharmacy or Health Dept. Verbalized acceptance and understanding.  Covid-19 vaccine status: Completed vaccines  Qualifies for Shingles Vaccine? Yes   Zostavax completed No   Shingrix Completed?: No.    Education has been provided regarding the importance of this vaccine. Patient has been advised to call insurance company to determine out of pocket expense if they have not yet received this vaccine. Advised may also receive vaccine at local pharmacy or Health Dept. Verbalized acceptance and understanding.  Screening Tests Health Maintenance  Topic Date Due   Zoster Vaccines- Shingrix (2 of 2) 11/08/2021   COVID-19 Vaccine (4 - 2023-24 season) 06/29/2022   COLONOSCOPY (Pts 45-34yr Insurance  coverage will need to be confirmed)  09/02/2022   Lung Cancer Screening  07/24/2023   Medicare Annual Wellness (AWV)  01/03/2024   MAMMOGRAM  11/21/2024   DTaP/Tdap/Td (3 - Td or Tdap) 02/22/2032   INFLUENZA VACCINE  Completed   Hepatitis C Screening  Completed   HIV Screening  Completed   HPV VACCINES  Aged Out    Health Maintenance  Health Maintenance Due  Topic Date Due   Zoster Vaccines- Shingrix (2 of 2) 11/08/2021   COVID-19 Vaccine (4 - 2023-24 season) 06/29/2022   COLONOSCOPY (Pts 45-453yrInsurance coverage will need to be confirmed)  09/02/2022    Colorectal cancer screening: Type of screening: Colonoscopy. Completed 09/02/2017. Repeat every 5 years  Mammogram status: Completed 11/21/2022. Repeat every year  Bone Density status: Never Done  Lung Cancer Screening: (Low Dose CT Chest recommended if Age 62-80ears, 30 pack-year currently smoking OR have quit w/in 15years.) does qualify.   Lung Cancer Screening Referral: ordered on 07/23/2022  Additional Screening:  Hepatitis C Screening:  does qualify; Completed 03/22/2016  Vision Screening: Recommended annual ophthalmology exams for early detection of glaucoma and other disorders of the eye. Is the patient up to date with their annual eye exam?  Yes  Who is the provider or what is the name of the office in which the patient attends annual eye exams? [facility]) If pt is not established with a provider, would they like to be referred to a provider to establish care? No .   Dental Screening: Recommended annual dental exams for proper oral hygiene  Community Resource Referral / Chronic Care Management: CRR required this visit?  No   CCM required this visit?  No      Plan:     I have personally reviewed and noted the following in the patient's chart:   Medical and social history Use of alcohol, tobacco or illicit drugs  Current medications and supplements including opioid prescriptions. Patient is  currently taking opioid prescriptions. Information provided to patient regarding non-opioid alternatives. Patient advised to discuss non-opioid treatment plan with their provider. Functional ability and status Nutritional status Physical activity Advanced directives List of other physicians Hospitalizations, surgeries, and ER visits in previous 12 months Vitals Screenings to include cognitive, depression, and falls Referrals and appointments  In addition, I have reviewed and discussed with patient certain preventive protocols, quality metrics, and best practice recommendations. A written personalized care plan for preventive services as well as general preventive health recommendations were provided to patient.     Sheral Flow, LPN   [id number]   Nurse Notes:  Normal cognitive status assessed by direct observation by this Nurse Health Advisor. No abnormalities found.

## 2023-01-09 LAB — VITAMIN B1: Thiamine: 120.7 nmol/L (ref 66.5–200.0)

## 2023-01-09 LAB — HEMOGLOBIN A1C
Est. average glucose Bld gHb Est-mCnc: 114 mg/dL
Hgb A1c MFr Bld: 5.6 % (ref 4.8–5.6)

## 2023-01-09 LAB — MULTIPLE MYELOMA PANEL, SERUM
Albumin SerPl Elph-Mcnc: 3.9 g/dL (ref 2.9–4.4)
Albumin/Glob SerPl: 1.1 (ref 0.7–1.7)
Alpha 1: 0.2 g/dL (ref 0.0–0.4)
Alpha2 Glob SerPl Elph-Mcnc: 0.7 g/dL (ref 0.4–1.0)
B-Globulin SerPl Elph-Mcnc: 1.1 g/dL (ref 0.7–1.3)
Gamma Glob SerPl Elph-Mcnc: 1.6 g/dL (ref 0.4–1.8)
Globulin, Total: 3.6 g/dL (ref 2.2–3.9)
IgA/Immunoglobulin A, Serum: 385 mg/dL — ABNORMAL HIGH (ref 87–352)
IgG (Immunoglobin G), Serum: 1757 mg/dL — ABNORMAL HIGH (ref 586–1602)
IgM (Immunoglobulin M), Srm: 81 mg/dL (ref 26–217)
Total Protein: 7.5 g/dL (ref 6.0–8.5)

## 2023-01-09 LAB — ANA COMPREHENSIVE PANEL
Anti JO-1: <0.2 AI (ref 0.0–0.9)
Centromere Ab Screen: <0.2 AI (ref 0.0–0.9)
Chromatin Ab SerPl-aCnc: <0.2 AI (ref 0.0–0.9)
ENA RNP Ab: 0.2 AI (ref 0.0–0.9)
ENA SM Ab Ser-aCnc: 0.2 AI (ref 0.0–0.9)
ENA SSA (RO) Ab: <0.2 AI (ref 0.0–0.9)
ENA SSB (LA) Ab: <0.2 AI (ref 0.0–0.9)
Scleroderma (Scl-70) (ENA) Antibody, IgG: <0.2 AI (ref 0.0–0.9)
dsDNA Ab: <1 IU/mL (ref 0–9)

## 2023-01-09 LAB — B12 AND FOLATE PANEL
Folate: 7.6 ng/mL (ref >3.0)
Vitamin B-12: 620 pg/mL (ref 232–1245)

## 2023-01-09 LAB — VITAMIN B6: Vitamin B6: 5.9 ug/L (ref 3.4–65.2)

## 2023-01-09 LAB — CK: Total CK: 66 U/L (ref 32–182)

## 2023-01-09 LAB — METHYLMALONIC ACID, SERUM: Methylmalonic Acid: 175 nmol/L (ref 0–378)

## 2023-01-29 ENCOUNTER — Ambulatory Visit
Admission: EM | Admit: 2023-01-29 | Discharge: 2023-01-29 | Disposition: A | Discharge status: Home / Self Care | Payer: Medicare Other | Attending: Nurse Practitioner | Admitting: Nurse Practitioner

## 2023-01-29 DIAGNOSIS — M5442 Lumbago with sciatica, left side: Secondary | ICD-10-CM | POA: Diagnosis not present

## 2023-01-29 LAB — POCT URINALYSIS DIP (MANUAL ENTRY)
Bilirubin, UA: NEGATIVE
Blood, UA: NEGATIVE
Glucose, UA: NEGATIVE mg/dL
Ketones, POC UA: NEGATIVE mg/dL
Nitrite, UA: NEGATIVE
Protein Ur, POC: NEGATIVE mg/dL
Spec Grav, UA: 1.015 (ref 1.010–1.025)
Urobilinogen, UA: 0.2 E.U./dL
pH, UA: 7 (ref 5.0–8.0)

## 2023-01-29 MED ORDER — METHYLPREDNISOLONE 4 MG PO TBPK: ORAL_TABLET | ORAL | 0 refills | Status: DC

## 2023-01-29 MED ORDER — DEXAMETHASONE SODIUM PHOSPHATE 10 MG/ML IJ SOLN
10.0000 mg | Freq: Once | INTRAMUSCULAR | Status: AC
  Administered 2023-01-29: 10 mg via INTRAMUSCULAR

## 2023-01-29 NOTE — ED Provider Notes (Signed)
[facility]    CSN: [id number] Arrival date & time: 01/29/23  1444      History   Chief Complaint No chief complaint on file.   HPI [name] is a 62 y.o. female.   Patient presents today with low back pain for the past day.  Denies any recent fall, accident, trauma, or injury to her back.  Reports she has had a lumbar fusion surgery years ago and has a pain specialist that manages her chronic pain.  Reports the back pain began when she was standing in a bakery.  Reports prior to that, she did lift a couple of 30 pound boxes and thinks this may have caused an injury to her back.  She does endorse a little bit of pain shooting down the left leg, however no new numbness or tingling in lower extremities.  No new urinary incontinence, saddle anesthesia, weakness in lower extremities, decree sensation lower extremities, fever, nausea/vomiting, dysuria/urinary frequency, or urinary urgency send symptoms began.  No vaginal discharge.    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   Numbness and tingling of upper and lower extremities of both sides 06/06/2022   Current severe episode of major depressive disorder without psychotic features without prior episode 06/06/2022   Tobacco abuse 09/15/2021   Chronic idiopathic constipation 09/15/2021   Diuretic-induced hypokalemia 09/13/2021   DDD (degenerative disc disease), lumbar 03/07/2021   Screening for  cervical cancer 07/07/2020   Essential hypertension 03/01/2020   Atherosclerosis of aorta 03/31/2019   Dyslipidemia, goal LDL below 100 03/31/2019   Visit for screening mammogram 03/31/2019   Allergic rhinitis 02/22/2017   Overweight (BMI 25.0-29.9) 03/22/2016   GAD (generalized anxiety disorder) 04/13/2010   PREMATURE VENTRICULAR CONTRACTIONS 02/06/2010   MIGRAINE HEADACHE 07/24/2007   GASTROESOPHAGEAL REFLUX DISEASE 07/24/2007    Past Surgical History:  Procedure Laterality Date   BACK SURGERY  10/23/2012   CERVICAL FUSION     CHOLECYSTECTOMY     COLONOSCOPY  2018   MS-suprep(exc)tics/hems-recall 5 yr   LAMINECTOMY     MRI     every 3 mths /due to menigitis   TOTAL ABDOMINAL HYSTERECTOMY     TUBAL LIGATION      OB History   No obstetric history on file.      Home Medications    Prior to Admission medications   Medication Sig Start Date End Date Taking? Authorizing Provider  methylPREDNISolone (MEDROL DOSEPAK) 4 MG TBPK tablet Use as directed on package. 01/29/23  Yes Eulogio Bear, NP  ALPRAZolam Duanne Moron) 0.5 MG tablet Take 1 tablet (0.5 mg total) by mouth at bedtime. 11/12/22   Janith Lima, MD  amLODipine (NORVASC) 5 MG tablet TAKE 1 TABLET(5 MG) BY MOUTH DAILY 11/04/22   Janith Lima, MD  fluticasone (FLONASE) 50 MCG/ACT nasal spray SHAKE LIQUID AND USE 2 SPRAYS IN EACH NOSTRIL DAILY 12/31/22  Janith Lima, MD  HYDROcodone-acetaminophen (NORCO/VICODIN) 5-325 MG tablet Take 1 tablet by mouth every 6 (six) hours as needed for moderate pain.    [provider]  indapamide (LOZOL) 2.5 MG tablet TAKE 1 TABLET(2.5 MG) BY MOUTH DAILY 06/27/21   Janith Lima, MD  levocetirizine (XYZAL) 5 MG tablet TAKE 1 TABLET(5 MG) BY MOUTH EVERY EVENING 11/09/22   Janith Lima, MD  LINZESS 145 MCG CAPS capsule Take 1 capsule (145 mcg total) by mouth daily before breakfast. 09/15/21   Janith Lima, MD  mirtazapine (REMERON) 7.5 MG tablet TAKE 1 TABLET(7.5 MG) BY  MOUTH AT BEDTIME 09/17/22   Janith Lima, MD  pravastatin (PRAVACHOL) 10 MG tablet Take 1 tablet (10 mg total) by mouth daily. 12/28/22   Janith Lima, MD  thiamine (VITAMIN B1) 100 MG tablet Take 1 tablet (100 mg total) by mouth daily. 06/12/22   Janith Lima, MD  tiZANidine (ZANAFLEX) 2 MG tablet Take 2 mg by mouth 3 (three) times daily as needed. 01/11/21   [provider]  varenicline (CHANTIX CONTINUING MONTH PAK) 1 MG tablet Take 1 tablet (1 mg total) by mouth 2 (two) times daily. 11/12/22   Janith Lima, MD  Varenicline Tartrate, Starter, (CHANTIX STARTING MONTH PAK) 0.5 MG X 11 & 1 MG X 42 TBPK Take one 0.5 mg tablet by mouth once daily for 3 days, then increase to one 0.5 mg tablet twice daily for 4 days, then increase to one 1 mg tablet twice daily. 11/12/22   Janith Lima, MD    Family History Family History  Problem Relation Age of Onset   Diabetes Mother    Multiple sclerosis Mother    Heart disease Father    Hypertension Sister    Kidney disease Sister    Colon cancer Neg Hx    Colon polyps Neg Hx    Esophageal cancer Neg Hx    Rectal cancer Neg Hx    Stomach cancer Neg Hx    Breast cancer Neg Hx     Social History Social History   Tobacco Use   Smoking status: Every Day    Packs/day: 2.00    Years: 36.00    Additional pack years: 0.00    Total pack years: 72.00    Types: Cigarettes    Last attempt to quit: 09/2019    Years since quitting: 3.3   Smokeless tobacco: Never  Vaping Use   Vaping Use: Never used  Substance Use Topics   Alcohol use: No   Drug use: No     Allergies   Livalo [pitavastatin], Anesthesia s-i-40 [propofol], Augmentin [amoxicillin-pot clavulanate], and Doxycycline   Review of Systems Review of Systems Per HPI  Physical Exam Triage Vital Signs ED Triage Vitals  Enc Vitals Group     BP 01/29/23 1521 (!) 142/88     Pulse Rate 01/29/23 1521 76     Resp 01/29/23 1521 18     Temp 01/29/23 1521 98.5 F (36.9 C)      Temp Source 01/29/23 1521 Oral     SpO2 01/29/23 1521 98 %     Weight --      Height --      Head Circumference --      Peak Flow --      Pain Score 01/29/23 1529 10     Pain Loc --      Pain Edu? --      Excl. in  GC? --    No data found.  Updated Vital Signs BP (!) 142/88 (BP Location: Right Arm)   Pulse 76   Temp 98.5 F (36.9 C) (Oral)   Resp 18   SpO2 98%   Visual Acuity Right Eye Distance:   Left Eye Distance:   Bilateral Distance:    Right Eye Near:   Left Eye Near:    Bilateral Near:     Physical Exam Vitals and nursing note reviewed.  Constitutional:      General: She is not in acute distress.    Appearance: Normal appearance. She is not toxic-appearing.  HENT:     Mouth/Throat:     Mouth: Mucous membranes are moist.     Pharynx: Oropharynx is clear.  Pulmonary:     Effort: Pulmonary effort is normal. No respiratory distress.  Musculoskeletal:       Back:     Comments: Inspection: No swelling, obvious deformity, or redness to para lumbar spine Palpation: Paraspinal muscles along the lumbar spine tender to palpation; no obvious deformities palpated ROM: Full ROM to bilateral lower extremities Strength: 5/5 bilateral lower extremities Neurovascular: neurovascularly intact in left and right lower extremity   Skin:    General: Skin is warm and dry.     Capillary Refill: Capillary refill takes less than 2 seconds.     Coloration: Skin is not jaundiced or pale.     Findings: No erythema.  Neurological:     Mental Status: She is alert and oriented to person, place, and time.  Psychiatric:        Behavior: Behavior is cooperative.      UC Treatments / Results  Labs (all labs ordered are listed, but only abnormal results are displayed) Labs Reviewed  POCT URINALYSIS DIP (MANUAL ENTRY) - Abnormal; Notable for the following components:      Result Value   Leukocytes, UA Trace (*)    All other components within normal limits     EKG   Radiology No results found.  Procedures Procedures (including critical care time)  Medications Ordered in UC Medications  dexamethasone (DECADRON) injection 10 mg (has no administration in time range)    Initial Impression / Assessment and Plan / UC Course  I have reviewed the triage vital signs and the nursing notes.  Pertinent labs & imaging results that were available during my care of the patient were reviewed by me and considered in my medical decision making (see chart for details).   Patient is well-appearing, normotensive, afebrile, not tachycardic, not tachypneic, oxygenating well on room air.    1. Acute left-sided low back pain with left-sided sciatica Suspect musculoskeletal strain versus exacerbated chronic back pain Vital signs and examination today are reassuring, no red flags in history or on exam Treat with Decadron 10 mg IM today in urgent care Start muscle relaxants-patient reports she has a prescription for this but has not been taking them Start methylprednisolone Dosepak tomorrow if symptoms persist into the morning Seek care with neurologist, neurosurgeon, or pain specialist if symptoms persist or worsen despite treatment  The patient was given the opportunity to ask questions.  All questions answered to their satisfaction.  The patient is in agreement to this plan.    Final Clinical Impressions(s) / UC Diagnoses   Final diagnoses:  Acute left-sided low back pain with left-sided sciatica     Discharge Instructions      We have given you a steroid shot today to help with pain.  Start the muscle relaxants that you have been prescribed already  If you wake up in pain tomorrow, start the Medrol Dosepak  Follow up for persistent or worsening symptoms despite treatment    ED Prescriptions     Medication Sig Dispense Auth. Provider   methylPREDNISolone (MEDROL DOSEPAK) 4 MG TBPK tablet Use as directed on package. 21 tablet Eulogio Bear, NP      PDMP not reviewed this encounter.   Eulogio Bear, NP 01/29/23 [phone]

## 2023-01-29 NOTE — Discharge Instructions (Signed)
We have given you a steroid shot today to help with pain.  Start the muscle relaxants that you have been prescribed already  If you wake up in pain tomorrow, start the Medrol Dosepak  Follow up for persistent or worsening symptoms despite treatment

## 2023-01-29 NOTE — ED Triage Notes (Signed)
Pt reports she has been having severe low back pain x 1 day.  Pt reports she has low abdominal pain when she presses on it.  Reports no urinary changes nor injury.

## 2023-02-06 ENCOUNTER — Telehealth: Payer: Self-pay

## 2023-02-06 NOTE — Transitions of Care (Post Inpatient/ED Visit) (Unsigned)
   02/06/2023  Name: [name] MRN: [id number] DOB: Jun 05, 1961  Today's TOC FU Call Status: Today's TOC FU Call Status:: Unsuccessul Call (1st Attempt) Unsuccessful Call (1st Attempt) Date: 02/06/23  Attempted to reach the patient regarding the most recent Inpatient/ED visit.  Follow Up Plan: Additional outreach attempts will be made to reach the patient to complete the Transitions of Care (Post Inpatient/ED visit) call.   Signature Karena Addison, LPN [facility] Nurse Health Advisor Direct Dial [phone]

## 2023-02-07 NOTE — Transitions of Care (Post Inpatient/ED Visit) (Signed)
   02/07/2023  Name: FINNLEIGH BURNSED MRN: [id number] DOB: 03/10/1961  Today's TOC FU Call Status: Today's TOC FU Call Status:: Successful TOC FU Call Competed Unsuccessful Call (1st Attempt) Date: 02/06/23 [facility] FU Call Complete Date: 02/07/23  Transition Care Management Follow-up Telephone Call Date of Discharge: 02/05/23 Discharge Facility: Other ([facility]) Name of Other ([facility]) [facility]charge Facility: [facility] Type of Discharge: Emergency Department Reason for ED Visit: Other: (oseteoarthritis) How have you been since you were released from the hospital?: Same Any questions or concerns?: No  Items Reviewed: Did you receive and understand the discharge instructions provided?: Yes Medications obtained and verified?: Yes (Medications Reviewed) Any new allergies since your discharge?: No Dietary orders reviewed?: Yes Do you have support at home?: Yes People in Home: child(ren), adult  Home Care and Equipment/Supplies: Were Home Health Services Ordered?: NA Any new equipment or medical supplies ordered?: NA  Functional Questionnaire: Do you need assistance with bathing/showering or dressing?: No Do you need assistance with meal preparation?: No Do you need assistance with eating?: No Do you have difficulty maintaining continence: No Do you have difficulty managing or taking your medications?: No  Follow up appointments reviewed: PCP Follow-up appointment confirmed?: Yes Date of PCP follow-up appointment?: 02/12/23 Follow-up Provider: Dr Yetta Barre [facility] Follow-up appointment confirmed?: NA Do you need transportation to your follow-up appointment?: No Do you understand care options if your condition(s) worsen?: Yes-patient verbalized understanding    SIGNATURE Karena Addison, LPN [facility] Nurse Health Advisor Direct Dial [phone]

## 2023-02-12 ENCOUNTER — Encounter: Payer: Self-pay | Admitting: Internal Medicine

## 2023-02-12 ENCOUNTER — Ambulatory Visit (INDEPENDENT_AMBULATORY_CARE_PROVIDER_SITE_OTHER): Payer: Medicare Other

## 2023-02-12 ENCOUNTER — Ambulatory Visit (INDEPENDENT_AMBULATORY_CARE_PROVIDER_SITE_OTHER): Payer: Medicare Other | Admitting: Internal Medicine

## 2023-02-12 VITALS — BP 128/72 | HR 68 | Temp 98.3°F | Ht 68.0 in

## 2023-02-12 DIAGNOSIS — M5416 Radiculopathy, lumbar region: Secondary | ICD-10-CM

## 2023-02-12 DIAGNOSIS — J309 Allergic rhinitis, unspecified: Secondary | ICD-10-CM

## 2023-02-12 DIAGNOSIS — M5442 Lumbago with sciatica, left side: Secondary | ICD-10-CM | POA: Diagnosis not present

## 2023-02-12 NOTE — Patient Instructions (Signed)
Sciatica  Sciatica is pain, numbness, weakness, or tingling along the path of the sciatic nerve. The sciatic nerve starts in the lower back and runs down the back of each leg. The nerve controls the muscles in the lower leg and in the back of the knee. It also provides feeling (sensation) to the back of the thigh, the lower leg, and the sole of the foot. Sciatica is a symptom of another medical condition that pinches or puts pressure on the sciatic nerve. Sciatica most often only affects one side of the body. Sciatica usually goes away on its own or with treatment. In some cases, sciatica may come back (recur). What are the causes? This condition is caused by pressure on the sciatic nerve or pinching of the nerve. This may be the result of: A disk in between the bones of the spine bulging out too far (herniated disk). Age-related changes in the spinal disks. A pain disorder that affects a muscle in the buttock. Extra bone growth near the sciatic nerve. A break (fracture) of the pelvis. Pregnancy. Tumor. This is rare. What increases the risk? The following factors may make you more likely to develop this condition: Playing sports that place pressure or stress on the spine. Having poor strength and flexibility. A history of back injury or surgery. Sitting for long periods of time. Doing activities that involve repetitive bending or lifting. Obesity. What are the signs or symptoms? Symptoms can vary from mild to very severe. They may include: Any of the following problems in the lower back, leg, hip, or buttock: Mild tingling, numbness, or dull aches. Burning sensations. Sharp pains. Numbness in the back of the calf or the sole of the foot. Leg weakness. Severe back pain that makes movement difficult. Symptoms may get worse when you cough, sneeze, or laugh, or when you sit or stand for long periods of time. How is this diagnosed? This condition may be diagnosed based on: Your symptoms  and medical history. A physical exam. Blood tests. Imaging tests, such as: X-rays. An MRI. A CT scan. How is this treated? In many cases, this condition improves on its own without treatment. However, treatment may include: Reducing or modifying physical activity. Exercising, including strengthening and stretching. Icing and applying heat to the affected area. Medicines that help to: Relieve pain and swelling. Relax your muscles. Injections of medicines that help to relieve pain and inflammation (steroids) around the sciatic nerve. Surgery. Follow these instructions at home: Medicines Take over-the-counter and prescription medicines only as told by your health care provider. Ask your health care provider if the medicine prescribed to you requires you to avoid driving or using heavy machinery. Managing pain     If directed, put ice on the affected area. To do this: Put ice in a plastic bag. Place a towel between your skin and the bag. Leave the ice on for 20 minutes, 2-3 times a day. If your skin turns bright red, remove the ice right away to prevent skin damage. The risk of skin damage is higher if you cannot feel pain, heat, or cold. If directed, apply heat to the affected area as often as told by your health care provider. Use the heat source that your health care provider recommends, such as a moist heat pack or a heating pad. Place a towel between your skin and the heat source. Leave the heat on for 20-30 minutes. If your skin turns bright red, remove the heat right away to prevent burns. The   risk of burns is higher if you cannot feel pain, heat, or cold. Activity  Return to your normal activities as told by your health care provider. Ask your health care provider what activities are safe for you. Avoid activities that make your symptoms worse. Take brief periods of rest throughout the day. When you rest for longer periods, mix in some mild activity or stretching between  periods of rest. This will help to prevent stiffness and pain. Avoid sitting for long periods of time without moving. Get up and move around at least one time each hour. Exercise and stretch regularly as told by your health care provider. Do not lift anything that is heavier than 10 lb (4.5 kg) until your health care provider says that it is safe. When you do not have symptoms, you should still avoid heavy lifting, especially repetitive heavy lifting. When you lift objects, always use proper lifting technique, which includes: Bending your knees. Keeping the load close to your body. Avoiding twisting. General instructions Maintain a healthy weight. Excess weight puts extra stress on your back. Wear supportive, comfortable shoes. Avoid wearing high heels. Avoid sleeping on a mattress that is too soft or too hard. A mattress that is firm enough to support your back when you sleep may help to reduce your pain. Contact a health care provider if: Your pain is not controlled by medicine. Your pain does not improve or gets worse. Your pain lasts longer than 4 weeks. You have unexplained weight loss. Get help right away if: You are not able to control when you urinate or have bowel movements (incontinence). You have: Weakness in your lower back, pelvis, buttocks, or legs that gets worse. Redness or swelling of your back. A burning sensation when you urinate. Summary Sciatica is pain, numbness, weakness, or tingling along the path of the sciatic nerve, which may include the lower back, legs, hips, and buttocks. This condition is caused by pressure on the sciatic nerve or pinching of the nerve. Treatment often includes rest, exercise, medicines, and applying ice or heat. This information is not intended to replace advice given to you by your health care provider. Make sure you discuss any questions you have with your health care provider. Document Revised: 01/22/2022 Document Reviewed:  01/22/2022 [employer] Patient Education  2023 [employer]  

## 2023-02-12 NOTE — Progress Notes (Unsigned)
Subjective:  Patient ID: [name], female    DOB: 1961-08-21  Age: 62 y.o. MRN: [id number]  CC: Osteoarthritis and Back Pain   HPI [name] presents for f/up --  She complains of a 3 week history left lower back pain that radiates into the LLE. She has been treated with steroids, a muscle relaxant, motrin, and an opiate without much relief.  Outpatient Medications Prior to Visit  Medication Sig Dispense Refill   ALPRAZolam (XANAX) 0.5 MG tablet Take 1 tablet (0.5 mg total) by mouth at bedtime. 90 tablet 1   amLODipine (NORVASC) 5 MG tablet TAKE 1 TABLET(5 MG) BY MOUTH DAILY 90 tablet 1   fluticasone (FLONASE) 50 MCG/ACT nasal spray SHAKE LIQUID AND USE 2 SPRAYS IN EACH NOSTRIL DAILY 48 g 1   HYDROcodone-acetaminophen (NORCO/VICODIN) 5-325 MG tablet Take 1 tablet by mouth every 6 (six) hours as needed for moderate pain.     indapamide (LOZOL) 2.5 MG tablet TAKE 1 TABLET(2.5 MG) BY MOUTH DAILY 90 tablet 0   levocetirizine (XYZAL) 5 MG tablet TAKE 1 TABLET(5 MG) BY MOUTH EVERY EVENING 90 tablet 1   LINZESS 145 MCG CAPS capsule Take 1 capsule (145 mcg total) by mouth daily before breakfast. 90 each 1   methylPREDNISolone (MEDROL DOSEPAK) 4 MG TBPK tablet Use as directed on package. 21 tablet 0   mirtazapine (REMERON) 7.5 MG tablet TAKE 1 TABLET(7.5 MG) BY MOUTH AT BEDTIME 90 tablet 0   pravastatin (PRAVACHOL) 10 MG tablet Take 1 tablet (10 mg total) by mouth daily. 90 tablet 1   thiamine (VITAMIN B1) 100 MG tablet Take 1 tablet (100 mg total) by mouth daily. 90 tablet 1   tiZANidine (ZANAFLEX) 2 MG tablet Take 2 mg by mouth 3 (three) times daily as needed.     varenicline (CHANTIX CONTINUING MONTH PAK) 1 MG tablet Take 1 tablet (1 mg total) by mouth 2 (two) times daily. 180 tablet 0   Varenicline Tartrate, Starter, (CHANTIX STARTING MONTH PAK) 0.5 MG X 11 & 1 MG X 42 TBPK Take one 0.5 mg tablet by mouth once daily for 3 days, then increase to one 0.5 mg tablet twice daily for 4  days, then increase to one 1 mg tablet twice daily. 53 each 0   No facility-administered medications prior to visit.    ROS Review of Systems  Constitutional: Negative.  Negative for chills, diaphoresis, fatigue and fever.  HENT: Negative.    Eyes: Negative.   Respiratory: Negative.  Negative for cough, chest tightness, shortness of breath and wheezing.   Cardiovascular:  Negative for chest pain, palpitations and leg swelling.  Gastrointestinal:  Negative for abdominal pain, constipation, diarrhea, nausea and vomiting.  Endocrine: Negative.   Genitourinary: Negative.  Negative for difficulty urinating.  Musculoskeletal:  Positive for arthralgias and back pain. Negative for joint swelling and myalgias.  Skin: Negative.   Neurological:  Positive for weakness (left leg). Negative for dizziness and light-headedness.  Hematological:  Negative for adenopathy. Does not bruise/bleed easily.  Psychiatric/Behavioral: Negative.      Objective:  BP 128/72 (BP Location: Right Arm, Patient Position: Sitting, Cuff Size: Large)   Pulse 68   Temp 98.3 F (36.8 C) (Oral)   Ht 5\' 8"  (1.727 m)   SpO2 98%   BMI 23.72 kg/m   BP Readings from Last 3 Encounters:  02/12/23 128/72  01/29/23 (!) 142/88  01/01/23 113/72    Wt Readings from Last 3 Encounters:  01/03/23 156 lb (  70.8 kg)  01/01/23 156 lb 9.6 oz (71 kg)  11/15/22 156 lb (70.8 kg)    Physical Exam Vitals reviewed.  Constitutional:      Appearance: She is ill-appearing (in a wheelchair).  HENT:     Nose: Nose normal.     Mouth/Throat:     Mouth: Mucous membranes are moist.  Eyes:     General: No scleral icterus.    Conjunctiva/sclera: Conjunctivae normal.  Cardiovascular:     Rate and Rhythm: Normal rate and regular rhythm.     Heart sounds: No murmur heard. Pulmonary:     Effort: Pulmonary effort is normal.     Breath sounds: No stridor. No wheezing, rhonchi or rales.  Abdominal:     General: Abdomen is flat.      Palpations: There is no mass.     Tenderness: There is no abdominal tenderness. There is no guarding.     Hernia: No hernia is present.  Musculoskeletal:     Cervical back: Neck supple.     Thoracic back: Normal.     Lumbar back: No swelling, deformity, tenderness or bony tenderness. Decreased range of motion. Positive left straight leg raise test. Negative right straight leg raise test.  Lymphadenopathy:     Cervical: No cervical adenopathy.  Skin:    General: Skin is warm and dry.  Neurological:     Mental Status: She is alert.     Motor: Weakness present.     Coordination: Coordination is intact.     Deep Tendon Reflexes: Reflexes normal.     Reflex Scores:      Tricep reflexes are 1+ on the right side and 1+ on the left side.      Bicep reflexes are 1+ on the right side and 1+ on the left side.      Brachioradialis reflexes are 1+ on the right side and 1+ on the left side.      Patellar reflexes are 1+ on the right side and 1+ on the left side.      Achilles reflexes are 0 on the right side and 0 on the left side.    Comments: Weakness in LLE  Psychiatric:        Mood and Affect: Mood normal.        Behavior: Behavior normal.     Lab Results  Component Value Date   WBC 5.7 06/06/2022   HGB 13.5 06/06/2022   HCT 41.1 06/06/2022   PLT 375.0 06/06/2022   GLUCOSE 103 (H) 11/12/2022   CHOL 186 11/12/2022   TRIG 81.0 11/12/2022   HDL 67.40 11/12/2022   LDLCALC 102 (H) 11/12/2022   ALT 14 06/06/2022   AST 18 06/06/2022   NA 139 11/12/2022   K 4.3 11/12/2022   CL 104 11/12/2022   CREATININE 0.80 11/12/2022   BUN 12 11/12/2022   CO2 27 11/12/2022   TSH 2.08 06/06/2022   HGBA1C 5.6 01/01/2023    DG Lumbar Spine Complete  Result Date: 02/12/2023 CLINICAL DATA:  Pain EXAM: LUMBAR SPINE - COMPLETE 4 VIEW COMPARISON:  None Available. FINDINGS: No evidence of lumbar spine fracture. Mild multilevel degenerative disc disease consisting of osteophyte formation, intervertebral  disc spaces are maintained. Moderate facet arthropathy, most pronounced in the lower lumbar spine. Mild grade 1 anterolisthesis of L3 on L4 and L4 on L5. Cholecystectomy clips. IMPRESSION: 1. No acute osseous abnormality of the lumbar spine. 2. Moderate facet arthropathy, most pronounced in the lower lumbar spine. Electronically  Signed   By: Allegra Lai M.D.   On: 02/12/2023 12:16   MM 3D SCREEN BREAST BILATERAL  Result Date: 11/23/2022 CLINICAL DATA:  Screening. EXAM: DIGITAL SCREENING BILATERAL MAMMOGRAM WITH TOMOSYNTHESIS AND CAD TECHNIQUE: Bilateral screening digital craniocaudal and mediolateral oblique mammograms were obtained. Bilateral screening digital breast tomosynthesis was performed. The images were evaluated with computer-aided detection. COMPARISON:  Previous exam(s). ACR Breast Density Category b: There are scattered areas of fibroglandular density. FINDINGS: There are no findings suspicious for malignancy. IMPRESSION: No mammographic evidence of malignancy. A result letter of this screening mammogram will be mailed directly to the patient. RECOMMENDATION: Screening mammogram in one year. (Code:SM-B-01Y) BI-RADS CATEGORY  1: Negative. Electronically Signed   By: Sande Brothers M.D.   On: 11/23/2022 08:26     EXAM: PELVIS - 1-2 VIEW  COMPARISON:  CT 09/11/2018  FINDINGS: SI joints are non widened. Pubic symphysis and rami appear intact. No fracture or malalignment. Mild hip degenerative change Procedure Note  Windell Moment, MD - 02/05/2023 Formatting of this note might be different from the original. CLINICAL DATA:  Pain   DG Lumbar Spine Complete  Result Date: 02/12/2023 CLINICAL DATA:  Pain EXAM: LUMBAR SPINE - COMPLETE 4 VIEW COMPARISON:  None Available. FINDINGS: No evidence of lumbar spine fracture. Mild multilevel degenerative disc disease consisting of osteophyte formation, intervertebral disc spaces are maintained. Moderate facet arthropathy, most pronounced in  the lower lumbar spine. Mild grade 1 anterolisthesis of L3 on L4 and L4 on L5. Cholecystectomy clips. IMPRESSION: 1. No acute osseous abnormality of the lumbar spine. 2. Moderate facet arthropathy, most pronounced in the lower lumbar spine. Electronically Signed   By: Allegra Lai M.D.   On: 02/12/2023 12:16     Assessment & Plan:    Left lumbar radiculitis- Plain films are significant for grade 1 anterolisthesis and facet arthropathy. MRI ordered to evaluate for spinal stenosis, nerve impingement, and disc herniation to see if she would benefit ESI's or surgical intervention. -     DG Lumbar Spine Complete; Future -     MR LUMBAR SPINE WO CONTRAST; Future  Acute left-sided low back pain with left-sided sciatica -     MR LUMBAR SPINE WO CONTRAST; Future     Follow-up: No follow-ups on file.  Sanda Linger, MD

## 2023-02-13 MED ORDER — LEVOCETIRIZINE DIHYDROCHLORIDE 5 MG PO TABS: 5.0000 mg | ORAL_TABLET | Freq: Every evening | ORAL | 1 refills | Status: DC

## 2023-02-13 NOTE — Addendum Note (Signed)
Addended by: Etta Grandchild on: 02/13/2023 08:01 AM   Modules accepted: Orders

## 2023-02-20 ENCOUNTER — Ambulatory Visit: Payer: Medicare Other | Admitting: Internal Medicine

## 2023-03-01 ENCOUNTER — Ambulatory Visit (HOSPITAL_COMMUNITY)
Admission: RE | Admit: 2023-03-01 | Discharge: 2023-03-01 | Disposition: A | Discharge status: Home / Self Care | Payer: Medicare Other | Source: Ambulatory Visit | Attending: Neurology | Admitting: Neurology

## 2023-03-01 DIAGNOSIS — M6281 Muscle weakness (generalized): Secondary | ICD-10-CM | POA: Diagnosis present

## 2023-03-01 DIAGNOSIS — M5412 Radiculopathy, cervical region: Secondary | ICD-10-CM | POA: Diagnosis present

## 2023-03-01 DIAGNOSIS — R202 Paresthesia of skin: Secondary | ICD-10-CM | POA: Insufficient documentation

## 2023-03-01 DIAGNOSIS — M5382 Other specified dorsopathies, cervical region: Secondary | ICD-10-CM | POA: Insufficient documentation

## 2023-03-01 DIAGNOSIS — G959 Disease of spinal cord, unspecified: Secondary | ICD-10-CM | POA: Insufficient documentation

## 2023-03-01 DIAGNOSIS — Z9889 Other specified postprocedural states: Secondary | ICD-10-CM | POA: Insufficient documentation

## 2023-03-01 DIAGNOSIS — R258 Other abnormal involuntary movements: Secondary | ICD-10-CM | POA: Insufficient documentation

## 2023-03-01 DIAGNOSIS — R292 Abnormal reflex: Secondary | ICD-10-CM | POA: Insufficient documentation

## 2023-03-06 ENCOUNTER — Telehealth: Payer: Self-pay | Admitting: Neurology

## 2023-03-06 NOTE — Telephone Encounter (Signed)
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 She told me she had stopped taking her B1 supplement when I saw her for appointment. Her B1 came back within normal limits when I retested it. 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 She will see Amy in July and if still having paresthesias then Amy can order an emg/ncs, she has an MRI lumbar spine pending from another doctor as well.

## 2023-03-06 NOTE — Telephone Encounter (Signed)
Please call patient. There is nothing new in the cervical spine. BUT there is old damage in the cervical cord due to her previous cervical problems and that could be the cause of her paresthesias in her body. If she has had these symptoms since her surgery that is probably the answer, old damage I the cervical spine. If not we can order an emg/ncs to further examine. 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  I would wait, sh ehas follow up with Amy in July, and if she isn't better with b1 supplementation then Amy can order an emg/ncs thanks  MRI c-spine IMPRESSION: 1. Previous fusion from C3 through C7. Solid union with sufficient patency of the canal and foramina presently. 2. 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 No apparent progressive change. 3. C2-3: Endplate osteophytes and bulging of the disc. Mild canal narrowing but no cord compression. Question if there is fusion of the facet joints at this level. 4. C7-T1: Endplate osteophytes and bulging of the disc. Narrowing of the ventral subarachnoid space but no compression of the cord. Moderate bilateral foraminal narrowing which could possibly affect either C8 nerve. No definite progression since 2016.

## 2023-03-06 NOTE — Telephone Encounter (Signed)
I called pt and she had stopped taking B1 supplement as her level was normal.  I told her to continue as she was severely deficient.  She will start again.  Will see Amy L. NP in July and if still with paresthesia;s will order [place]/emg.  Pt verbalized understanding of plan.  Appreciated call back.

## 2023-03-06 NOTE — Telephone Encounter (Signed)
I called pt.  She said that she started with paresthesia's since after her surgery.  I relayed about her B1 (but did not see B1 deficiency in her labs).  I told her want to confirm and then would call her back.

## 2023-05-10 ENCOUNTER — Other Ambulatory Visit: Payer: Self-pay | Admitting: Internal Medicine

## 2023-05-10 DIAGNOSIS — I10 Essential (primary) hypertension: Secondary | ICD-10-CM

## 2023-05-20 NOTE — Patient Instructions (Signed)
Below is our plan:  We will order B1 labs and a nerve conduction study to look for any particular nerve damage that could contribute to your symptoms. Discuss trial or Lyrica or gabapentin if symptoms become more painful/bothersome.   Please make sure you are staying well hydrated. I recommend 50-60 ounces daily. Well balanced diet and regular exercise encouraged. Consistent sleep schedule with 6-8 hours recommended.   Please continue follow up with care team as directed.   Follow up with me pending nerve conduction study results.   You may receive a survey regarding today's visit. I encourage you to leave honest feed back as I do use this information to improve patient care. Thank you for seeing me today!

## 2023-05-20 NOTE — Progress Notes (Signed)
No chief complaint on file.   HISTORY OF PRESENT ILLNESS:  05/20/23 ALL:  [name] is a 62 y.o. female here today for follow up for  paresthesias. She was seen by Dr Lucia Gaskins 12/2022 with complaints of numbness and tingling of upper and lower ext. 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 She was advised to continue B1 supplementation.   Since,    HISTORY (copied from Dr Trevor Mace previous note)  Addendum Dr. Lucia Gaskins 03/06/2023: Please call patient. There is nothing new in the cervical spine. BUT there is old damage in the cervical cord due to her previous cervical problems and that could be the cause of her paresthesias in her body. If she has had these symptoms since her surgery that is probably the answer, old damage I the cervical spine. If not we can order an emg/ncs to further examine. 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 I would wait, she has follow up with Kailyn Vanderslice in July, and if she isn't better with b1 supplementation then Zacary Bauer can order an emg/ncs thanks (Her B1 was 7 in 05/2022 (normal 8-30) which is severe almost undetectable. She told me she had stopped taking her B1 supplement when I saw her for appointment. Her B1 came back within normal limits when I retested it. 12/2022 But she was severely b1 deficient for who knows how long and that can cause paresthesias and neuropathy. She will see Nechama Escutia in July and if still having paresthesias then Adelin Ventrella can order an emg/ncs, she has an MRI lumbar spine pending from another doctor as well. )   MRI c-spine IMPRESSION: 1. Previous fusion from C3 through C7. Solid union with sufficient patency of the canal and foramina presently. 2. 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 No apparent progressive change. 3. C2-3: Endplate osteophytes and bulging of the disc. Mild canal narrowing but no cord compression. Question if there is fusion of the facet joints at this  level. 4. C7-T1: Endplate osteophytes and bulging of the disc. Narrowing of the ventral subarachnoid space but no compression of the cord. Moderate bilateral foraminal narrowing which could possibly affect either C8 nerve. No definite progression since 2016.   HPI:  [name] is a 62 y.o. female here as requested by Etta Grandchild, MD for numbness and tingling. PMHx migraine, HTN, gerd, constipation, thiamine neuropathy, degenerative disease lumbar, numbness nd tingling upper and lower extremities, GAD.  I reviewed Dr. Barnett Applebaum notes.  Patient continues to take B1 supplementation but still complains of numbness and tingling in her arms and legs.  Prior notes state a lot of stress, her son was murdered in 2023 unfortunately and at that time she had a 17-month history of numbness and tingling sensation symmetrically in both upper and lower extremities.  Dr. 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   She still c/o numbness and tingling in the feet and the legs. It feels like a million needles in the legs and the feet. She is not taking her B1, she jus stopped it, getting worse since stopping the b1, has not taken B1 in months. She took about 100 pills so 3 months so hasn't taken in about 6 months, she has lost 50 pounds, discussed The most common effect of thiamin deficiency is beriberi, which is characterized mainly by peripheral neuropathy and wasting and she  has lost 50 pounds. We will recheck all vitamin levels and ask her to start thiamine 100mg .  Pain in the neck, ongoing for years, crepitus, arm numbness, imbalance/ataxia reported need to MRI cervical spine as well.    REVIEW OF SYSTEMS: Out of a complete 14 system review of symptoms, the patient complains only of the following symptoms, and all other reviewed systems are negative.   ALLERGIES: Allergies  Allergen Reactions   Livalo [Pitavastatin] Other (See Comments)    Muscle  aches   Anesthesia S-I-40 [Propofol] Itching   Augmentin [Amoxicillin-Pot Clavulanate] Nausea And Vomiting   Doxycycline     Nausea     HOME MEDICATIONS: Outpatient Medications Prior to Visit  Medication Sig Dispense Refill   ALPRAZolam (XANAX) 0.5 MG tablet Take 1 tablet (0.5 mg total) by mouth at bedtime. 90 tablet 1   amLODipine (NORVASC) 5 MG tablet TAKE 1 TABLET(5 MG) BY MOUTH DAILY 90 tablet 1   fluticasone (FLONASE) 50 MCG/ACT nasal spray SHAKE LIQUID AND USE 2 SPRAYS IN EACH NOSTRIL DAILY 48 g 1   HYDROcodone-acetaminophen (NORCO/VICODIN) 5-325 MG tablet Take 1 tablet by mouth every 6 (six) hours as needed for moderate pain.     indapamide (LOZOL) 2.5 MG tablet TAKE 1 TABLET(2.5 MG) BY MOUTH DAILY 90 tablet 0   levocetirizine (XYZAL) 5 MG tablet Take 1 tablet (5 mg total) by mouth every evening. 90 tablet 1   LINZESS 145 MCG CAPS capsule Take 1 capsule (145 mcg total) by mouth daily before breakfast. 90 each 1   methylPREDNISolone (MEDROL DOSEPAK) 4 MG TBPK tablet Use as directed on package. 21 tablet 0   mirtazapine (REMERON) 7.5 MG tablet TAKE 1 TABLET(7.5 MG) BY MOUTH AT BEDTIME 90 tablet 0   pravastatin (PRAVACHOL) 10 MG tablet Take 1 tablet (10 mg total) by mouth daily. 90 tablet 1   thiamine (VITAMIN B1) 100 MG tablet Take 1 tablet (100 mg total) by mouth daily. 90 tablet 1   tiZANidine (ZANAFLEX) 2 MG tablet Take 2 mg by mouth 3 (three) times daily as needed.     varenicline (CHANTIX CONTINUING MONTH PAK) 1 MG tablet Take 1 tablet (1 mg total) by mouth 2 (two) times daily. 180 tablet 0   Varenicline Tartrate, Starter, (CHANTIX STARTING MONTH PAK) 0.5 MG X 11 & 1 MG X 42 TBPK Take one 0.5 mg tablet by mouth once daily for 3 days, then increase to one 0.5 mg tablet twice daily for 4 days, then increase to one 1 mg tablet twice daily. 53 each 0   No facility-administered medications prior to visit.     PAST MEDICAL HISTORY: Past Medical History:  Diagnosis Date   Anxiety     on meds   Depression    on meds   DJD (degenerative joint disease)    back/neck   GERD (gastroesophageal reflux disease)    History of palpitations    Migraine headache    Neuromuscular disorder (HCC)    on meds   Occipital neuralgia    PVC's (premature ventricular contractions)    Originating from [facility]   Seasonal allergies      PAST SURGICAL HISTORY: Past Surgical History:  Procedure Laterality Date   BACK SURGERY  10/23/2012   CERVICAL FUSION     CHOLECYSTECTOMY     COLONOSCOPY  2018   MS-suprep(exc)tics/hems-recall 5 yr   LAMINECTOMY     MRI     every 3 mths /due to menigitis   TOTAL ABDOMINAL HYSTERECTOMY  TUBAL LIGATION       FAMILY HISTORY: Family History  Problem Relation Age of Onset   Diabetes Mother    Multiple sclerosis Mother    Heart disease Father    Hypertension Sister    Kidney disease Sister    Colon cancer Neg Hx    Colon polyps Neg Hx    Esophageal cancer Neg Hx    Rectal cancer Neg Hx    Stomach cancer Neg Hx    Breast cancer Neg Hx      SOCIAL HISTORY: Social History   Socioeconomic History   Marital status: Married    Spouse name: Not on file   Number of children: 2   Years of education: 12   Highest education level: Not on file  Occupational History   Occupation: Retired/disabled  Tobacco Use   Smoking status: Every Day    Current packs/day: 0.00    Average packs/day: 2.0 packs/day for 36.0 years (72.0 ttl pk-yrs)    Types: Cigarettes    Start date: 09/1983    Last attempt to quit: 09/2019    Years since quitting: 3.6   Smokeless tobacco: Never  Vaping Use   Vaping status: Never Used  Substance and Sexual Activity   Alcohol use: No   Drug use: No   Sexual activity: Yes    Partners: Male  Other Topics Concern   Not on file  Social History Narrative   Occupation: retired [employer]   Widowed  (lost husband, son and sister since last seen) 01-01-2023   Alcohol no   Drug use no, no  caffiene   Tobacco use- yes quit Dec '08   Regular Exercise no   4 children; reports increased stress in due to caring for grandchildren   UNEMPLOYED      Lives at home    Right-handed   Caffeine: 1-2 cup of coffee    Social Determinants of Health   Financial Resource Strain: Low Risk  (01/03/2023)   Overall Financial Resource Strain (CARDIA)    Difficulty of Paying Living Expenses: Not hard at all  Food Insecurity: No Food Insecurity (01/03/2023)   Hunger Vital Sign    Worried About Running Out of Food in the Last Year: Never true    Ran Out of Food in the Last Year: Never true  Transportation Needs: No Transportation Needs (01/03/2023)   PRAPARE - Administrator, Civil Service (Medical): No    Lack of Transportation (Non-Medical): No  Physical Activity: Inactive (01/03/2023)   Exercise Vital Sign    Days of Exercise per Week: 0 days    Minutes of Exercise per Session: 0 min  Stress: No Stress Concern Present (01/03/2023)   [employer] of Occupational Health - Occupational Stress Questionnaire    Feeling of Stress : Not at all  Social Connections: Moderately Integrated (12/19/2021)   Social Connection and Isolation Panel [NHANES]    Frequency of Communication with Friends and Family: More than three times a week    Frequency of Social Gatherings with Friends and Family: More than three times a week    Attends Religious Services: More than 4 times per year    Active Member of [employer] or Organizations: Yes    Attends Banker Meetings: More than 4 times per year    Marital Status: Widowed  Intimate Partner Violence: Not At Risk (01/03/2023)   Humiliation, Afraid, Rape, and Kick questionnaire    Fear of Current or  Ex-Partner: No    Emotionally Abused: No    Physically Abused: No    Sexually Abused: No     PHYSICAL EXAM  There were no vitals filed for this visit. There is no height or weight on file to calculate BMI.  Generalized: Well developed, in no  acute distress  Cardiology: normal rate and rhythm, no murmur auscultated  Respiratory: clear to auscultation bilaterally    Neurological examination  Mentation: Alert oriented to time, place, history taking. Follows all commands speech and language fluent Cranial nerve II-XII: Pupils were equal round reactive to light. Extraocular movements were full, visual field were full on confrontational test. Facial sensation and strength were normal. Uvula tongue midline. Head turning and shoulder shrug  were normal and symmetric. Motor: The motor testing reveals 5 over 5 strength of all 4 extremities. Good symmetric motor tone is noted throughout.  Sensory: Sensory testing is intact to soft touch on all 4 extremities. No evidence of extinction is noted.  Coordination: Cerebellar testing reveals good finger-nose-finger and heel-to-shin bilaterally.  Gait and station: Gait is normal. Tandem gait is normal. Romberg is negative. No drift is seen.  Reflexes: Deep tendon reflexes are symmetric and normal bilaterally.    DIAGNOSTIC DATA (LABS, IMAGING, TESTING) - I reviewed patient records, labs, notes, testing and imaging myself where available.  Lab Results  Component Value Date   WBC 5.7 06/06/2022   HGB 13.5 06/06/2022   HCT 41.1 06/06/2022   MCV 89.0 06/06/2022   PLT 375.0 06/06/2022      Component Value Date/Time   NA 139 11/12/2022 1019   K 4.3 11/12/2022 1019   CL 104 11/12/2022 1019   CO2 27 11/12/2022 1019   GLUCOSE 103 (H) 11/12/2022 1019   BUN 12 11/12/2022 1019   CREATININE 0.80 11/12/2022 1019   CREATININE 0.76 07/07/2020 1130   CALCIUM 9.7 11/12/2022 1019   PROT 7.5 01/01/2023 1146   ALBUMIN 4.5 06/06/2022 1027   AST 18 06/06/2022 1027   ALT 14 06/06/2022 1027   ALKPHOS 74 06/06/2022 1027   BILITOT 1.1 06/06/2022 1027   GFRNONAA >60 02/12/2021 1830   GFRNONAA 86 07/07/2020 1130   GFRAA 100 07/07/2020 1130   Lab Results  Component Value Date   CHOL 186 11/12/2022    HDL 67.40 11/12/2022   LDLCALC 102 (H) 11/12/2022   TRIG 81.0 11/12/2022   CHOLHDL 3 11/12/2022   Lab Results  Component Value Date   HGBA1C 5.6 01/01/2023   Lab Results  Component Value Date   VITAMINB12 620 01/01/2023   Lab Results  Component Value Date   TSH 2.08 06/06/2022        No data to display               No data to display           ASSESSMENT AND PLAN  62 y.o. year old female  has a past medical history of Anxiety, Depression, DJD (degenerative joint disease), GERD (gastroesophageal reflux disease), History of palpitations, Migraine headache, Neuromuscular disorder (HCC), Occipital neuralgia, PVC's (premature ventricular contractions), and Seasonal allergies. here with    No diagnosis found.  [name] ***.  Healthy lifestyle habits encouraged. *** will follow up with PCP as directed. *** will return to see me in ***, sooner if needed. *** verbalizes understanding and agreement with this plan.   No orders of the defined types were placed in this encounter.    No orders of the defined  types were placed in this encounter.    Shawnie Dapper, MSN, FNP-C 05/20/2023, 12:42 PM  [facility] [address] [place], [place] [address] [phone]

## 2023-05-21 ENCOUNTER — Ambulatory Visit (INDEPENDENT_AMBULATORY_CARE_PROVIDER_SITE_OTHER): Payer: Medicare Other | Admitting: Family Medicine

## 2023-05-21 ENCOUNTER — Encounter: Payer: Self-pay | Admitting: Family Medicine

## 2023-05-21 VITALS — BP 126/79 | HR 60 | Ht 69.0 in | Wt 156.5 lb

## 2023-05-21 DIAGNOSIS — R202 Paresthesia of skin: Secondary | ICD-10-CM | POA: Diagnosis not present

## 2023-05-21 DIAGNOSIS — E519 Thiamine deficiency, unspecified: Secondary | ICD-10-CM

## 2023-05-21 DIAGNOSIS — R531 Weakness: Secondary | ICD-10-CM

## 2023-06-15 ENCOUNTER — Emergency Department (HOSPITAL_COMMUNITY): Payer: Medicare Other

## 2023-06-15 ENCOUNTER — Emergency Department (HOSPITAL_COMMUNITY)
Admission: EM | Admit: 2023-06-15 | Discharge: 2023-06-15 | Disposition: A | Discharge status: Home / Self Care | Payer: Medicare Other | Attending: Emergency Medicine | Admitting: Emergency Medicine

## 2023-06-15 ENCOUNTER — Other Ambulatory Visit: Payer: Self-pay

## 2023-06-15 ENCOUNTER — Encounter (HOSPITAL_COMMUNITY): Payer: Self-pay

## 2023-06-15 DIAGNOSIS — R0981 Nasal congestion: Secondary | ICD-10-CM

## 2023-06-15 DIAGNOSIS — R42 Dizziness and giddiness: Secondary | ICD-10-CM | POA: Diagnosis present

## 2023-06-15 LAB — BASIC METABOLIC PANEL
Anion gap: 10 (ref 5–15)
BUN: 10 mg/dL (ref 8–23)
CO2: 20 mmol/L — ABNORMAL LOW (ref 22–32)
Calcium: 9 mg/dL (ref 8.9–10.3)
Chloride: 103 mmol/L (ref 98–111)
Creatinine, Ser: 0.67 mg/dL (ref 0.44–1.00)
GFR, Estimated: >60 mL/min (ref >60)
Glucose, Bld: 113 mg/dL — ABNORMAL HIGH (ref 70–99)
Potassium: 3.8 mmol/L (ref 3.5–5.1)
Sodium: 133 mmol/L — ABNORMAL LOW (ref 135–145)

## 2023-06-15 LAB — CBC
HCT: 38.3 % (ref 36.0–46.0)
Hemoglobin: 12.5 g/dL (ref 12.0–15.0)
MCH: 29.1 pg (ref 26.0–34.0)
MCHC: 32.6 g/dL (ref 30.0–36.0)
MCV: 89.3 fL (ref 80.0–100.0)
Platelets: 310 10*3/uL (ref 150–400)
RBC: 4.29 MIL/uL (ref 3.87–5.11)
RDW: 13.1 % (ref 11.5–15.5)
WBC: 6.8 10*3/uL (ref 4.0–10.5)
nRBC: 0 % (ref 0.0–0.2)

## 2023-06-15 MED ORDER — LORATADINE 10 MG PO TABS
10.0000 mg | ORAL_TABLET | Freq: Once | ORAL | Status: AC
  Administered 2023-06-15: 10 mg via ORAL
  Filled 2023-06-15: qty 1

## 2023-06-15 MED ORDER — MECLIZINE HCL 12.5 MG PO TABS
12.5000 mg | ORAL_TABLET | Freq: Once | ORAL | Status: AC
  Administered 2023-06-15: 12.5 mg via ORAL
  Filled 2023-06-15: qty 1

## 2023-06-15 MED ORDER — PSEUDOEPHEDRINE HCL ER 120 MG PO TB12
120.0000 mg | ORAL_TABLET | Freq: Once | ORAL | Status: DC
Start: 2023-06-15 — End: 2023-06-15

## 2023-06-15 MED ORDER — LORATADINE 10 MG PO TABS
10.0000 mg | ORAL_TABLET | Freq: Once | ORAL | Status: DC
Start: 2023-06-15 — End: 2023-06-15

## 2023-06-15 MED ORDER — PSEUDOEPHEDRINE HCL 60 MG PO TABS
60.0000 mg | ORAL_TABLET | Freq: Once | ORAL | Status: AC
  Administered 2023-06-15: 60 mg via ORAL
  Filled 2023-06-15: qty 1

## 2023-06-15 MED ORDER — MECLIZINE HCL 12.5 MG PO TABS: 12.5000 mg | ORAL_TABLET | Freq: Three times a day (TID) | ORAL | 0 refills | Status: DC | PRN

## 2023-06-15 MED ORDER — ACETAMINOPHEN 500 MG PO TABS
1000.0000 mg | ORAL_TABLET | Freq: Once | ORAL | Status: AC
  Administered 2023-06-15: 1000 mg via ORAL
  Filled 2023-06-15: qty 2

## 2023-06-15 NOTE — ED Notes (Signed)
Pt ambulated to bathroom with minimal assistance. Complains of significant dizziness but reports slight improvement from earlier.

## 2023-06-15 NOTE — Discharge Instructions (Signed)
It was our pleasure to provide your ER care today - we hope that you feel better.  Drink plenty of fluids/stay well hydrated.   Take antivert as need for dizziness - may make drowsy, no driving when taking or when feeling dizzy.  If sinus congestion, you may try zyrtec-d or claritin-d as need for symptom relief.   Follow up with primary care doctor in the coming week.  Return to ER if worse, new symptoms, fevers, severe dizziness, fainting, chest pain, trouble breathing, or other concern.

## 2023-06-15 NOTE — ED Provider Notes (Signed)
[place] EMERGENCY DEPARTMENT AT [facility] Provider Note   CSN: [id number] Arrival date & time: 06/15/23  0845     History  Chief Complaint  Patient presents with   Dizziness    [name] is a 62 y.o. female.  Pt with c/o feeling dizzy this AM. Notes room spinning sensation that is worse when moving head certain directions. No faintness or syncope. +recent nasal/sinus congestion. No severe sinus pain or headache. No associated change in speech or vision. No new numbness/weakness or loss of functional ability. Had prior episode(s) of dizziness, pt not sure of cause. Denies change in meds or new meds. No fever or chills. No ear pain, tinnitus, or hearing loss. No falls.   The history is provided by the patient, medical records and the EMS personnel.  Dizziness Associated symptoms: no chest pain, no headaches, no palpitations, no shortness of breath, no vomiting and no weakness        Home Medications Prior to Admission medications   Medication Sig Start Date End Date Taking? Authorizing Provider  ALPRAZolam Prudy Feeler) 0.5 MG tablet Take 1 tablet (0.5 mg total) by mouth at bedtime. 11/12/22   Etta Grandchild, MD  amLODipine (NORVASC) 5 MG tablet TAKE 1 TABLET(5 MG) BY MOUTH DAILY 05/11/23   Etta Grandchild, MD  fluticasone [facility]) 50 MCG/ACT nasal spray SHAKE LIQUID AND USE 2 SPRAYS IN [facility] NOSTRIL DAILY 12/31/22   Etta Grandchild, MD  HYDROcodone-acetaminophen (NORCO/VICODIN) 5-325 MG tablet Take 1 tablet by mouth every 6 (six) hours as needed for moderate pain.    [provider]  indapamide (LOZOL) 2.5 MG tablet TAKE 1 TABLET(2.5 MG) BY MOUTH DAILY 06/27/21   Etta Grandchild, MD  levocetirizine (XYZAL) 5 MG tablet Take 1 tablet (5 mg total) by mouth every evening. 02/13/23   Etta Grandchild, MD  LINZESS 145 MCG CAPS capsule Take 1 capsule (145 mcg total) by mouth daily before breakfast. 09/15/21   Etta Grandchild, MD  mirtazapine (REMERON) 7.5 MG tablet TAKE  1 TABLET(7.5 MG) BY MOUTH AT BEDTIME 09/17/22   Etta Grandchild, MD  pravastatin (PRAVACHOL) 10 MG tablet Take 1 tablet (10 mg total) by mouth daily. Patient not taking: Reported on 05/21/2023 12/28/22   Etta Grandchild, MD  thiamine (VITAMIN B1) 100 MG tablet Take 1 tablet (100 mg total) by mouth daily. Patient not taking: Reported on 05/21/2023 06/12/22   Etta Grandchild, MD  tiZANidine (ZANAFLEX) 2 MG tablet Take 2 mg by mouth 3 (three) times daily as needed. 01/11/21   [provider]  Varenicline Tartrate, Starter, (CHANTIX STARTING MONTH PAK) 0.5 MG X 11 & 1 MG X 42 TBPK Take one 0.5 mg tablet by mouth once daily for 3 days, then increase to one 0.5 mg tablet twice daily for 4 days, then increase to one 1 mg tablet twice daily. 11/12/22   Etta Grandchild, MD      Allergies    Livalo [pitavastatin], Anesthesia s-i-40 [propofol], Augmentin [amoxicillin-pot clavulanate], and Doxycycline    Review of Systems   Review of Systems  Constitutional:  Negative for chills and fever.  HENT:  Positive for congestion and rhinorrhea. Negative for sore throat.   Eyes:  Negative for pain, redness and visual disturbance.  Respiratory:  Negative for cough and shortness of breath.   Cardiovascular:  Negative for chest pain, palpitations and leg swelling.  Gastrointestinal:  Negative for abdominal pain and vomiting.  Genitourinary:  Negative  for dysuria and flank pain.  Musculoskeletal:  Negative for back pain.  Skin:  Negative for rash.  Neurological:  Positive for dizziness. Negative for speech difficulty, weakness and headaches.  Psychiatric/Behavioral:  Negative for confusion.     Physical Exam Updated Vital Signs BP 121/68   Pulse 82   Temp 99.2 F (37.3 C) (Oral)   Resp 13   Ht 1.753 m (5\' 9" )   Wt 70.8 kg   SpO2 100%   BMI 23.04 kg/m  Physical Exam Vitals and nursing note reviewed.  Constitutional:      Appearance: Normal appearance. She is well-developed.  HENT:     Head:  Atraumatic.     Comments: No sinus or temporal tenderness.     Right Ear: Tympanic membrane and ear canal normal.     Left Ear: Tympanic membrane and ear canal normal.     Ears:     Comments: No mastoid tenderness.     Nose: Congestion present.     Mouth/Throat:     Mouth: Mucous membranes are moist.     Pharynx: Oropharynx is clear.  Eyes:     General: No scleral icterus.    Conjunctiva/sclera: Conjunctivae normal.     Pupils: Pupils are equal, round, and reactive to light.  Neck:     Vascular: No carotid bruit.     Trachea: No tracheal deviation.     Comments: No stiffness or rigidity.  Cardiovascular:     Rate and Rhythm: Normal rate and regular rhythm.     Pulses: Normal pulses.     Heart sounds: Normal heart sounds. No murmur heard.    No friction rub. No gallop.  Pulmonary:     Effort: Pulmonary effort is normal. No respiratory distress.     Breath sounds: Normal breath sounds.  Abdominal:     General: There is no distension.     Palpations: Abdomen is soft.     Tenderness: There is no abdominal tenderness.  Genitourinary:    Comments: No cva tenderness.  Musculoskeletal:        General: No swelling or tenderness.     Cervical back: Normal range of motion and neck supple. No rigidity or tenderness. No muscular tenderness.     Right lower leg: No edema.     Left lower leg: No edema.  Skin:    General: Skin is warm and dry.     Findings: No rash.  Neurological:     Mental Status: She is alert and oriented to person, place, and time.     Cranial Nerves: No cranial nerve deficit.     Comments: Alert, speech normal. No dysarthria or aphasia. Motor/sens grossly intact bil. Stre 5/5. No pronator drift. Gait testing initially deferred due to dizziness/will give meds, reassess. No nystagmus.   Psychiatric:        Mood and Affect: Mood normal.     ED Results / Procedures / Treatments   Labs (all labs ordered are listed, but only abnormal results are displayed) Results  for orders placed or performed during the hospital encounter of 06/15/23  Basic metabolic panel  Result Value Ref Range   Sodium 133 (L) 135 - 145 mmol/L   Potassium 3.8 3.5 - 5.1 mmol/L   Chloride 103 98 - 111 mmol/L   CO2 20 (L) 22 - 32 mmol/L   Glucose, Bld 113 (H) 70 - 99 mg/dL   BUN 10 8 - 23 mg/dL   Creatinine, Ser 1.61 0.44 -  1.00 mg/dL   Calcium 9.0 8.9 - 16.1 mg/dL   GFR, Estimated >09 >60 mL/min   Anion gap 10 5 - 15  CBC  Result Value Ref Range   WBC 6.8 4.0 - 10.5 K/uL   RBC 4.29 3.87 - 5.11 MIL/uL   Hemoglobin 12.5 12.0 - 15.0 g/dL   HCT 45.4 09.8 - 11.9 %   MCV 89.3 80.0 - 100.0 fL   MCH 29.1 26.0 - 34.0 pg   MCHC 32.6 30.0 - 36.0 g/dL   RDW 14.7 82.9 - 56.2 %   Platelets 310 150 - 400 K/uL   nRBC 0.0 0.0 - 0.2 %     EKG EKG Interpretation Date/Time:  Saturday June 15 2023 08:55:46 EDT Ventricular Rate:  84 PR Interval:  168 QRS Duration:  80 QT Interval:  342 QTC Calculation: 405 R Axis:   78  Text Interpretation: Sinus rhythm Confirmed by Cathren Laine ([id number]) on 06/15/2023 9:56:10 AM  Radiology MR BRAIN WO CONTRAST  Result Date: 06/15/2023 CLINICAL DATA:  Syncope/presyncope, cerebrovascular cause suspected. EXAM: MRI HEAD WITHOUT CONTRAST TECHNIQUE: Multiplanar, multiecho pulse sequences of the brain and surrounding structures were obtained without intravenous contrast. COMPARISON:  MRI brain 03/20/2020. FINDINGS: Brain: No acute infarct or hemorrhage. No mass or midline shift. No hydrocephalus or extra-axial collection. No abnormal susceptibility. Vascular: Normal flow voids. Skull and upper cervical spine: Normal marrow signal. Sinuses/Orbits: No acute findings. Other: None. IMPRESSION: Normal brain MRI. Electronically Signed   By: Orvan Falconer M.D.   On: 06/15/2023 11:37    Procedures Procedures    Medications Ordered in ED Medications  acetaminophen (TYLENOL) tablet 1,000 mg (1,000 mg Oral Given 06/15/23 0915)  meclizine (ANTIVERT) tablet  12.5 mg (12.5 mg Oral Given 06/15/23 0915)  loratadine (CLARITIN) tablet 10 mg (10 mg Oral Given 06/15/23 0920)    And  pseudoephedrine (SUDAFED) tablet 60 mg (60 mg Oral Given 06/15/23 0919)    ED Course/ Medical Decision Making/ A&P                                 Medical Decision Making Problems Addressed: Dizziness: acute illness or injury with systemic symptoms that poses a threat to life or bodily functions Sinus congestion: acute illness or injury  Amount and/or Complexity of Data Reviewed External Data Reviewed: labs, radiology and notes. Labs: ordered. Decision-making details documented in ED Course. Radiology: ordered and independent interpretation performed. Decision-making details documented in ED Course. ECG/medicine tests: ordered and independent interpretation performed. Decision-making details documented in ED Course.  Risk OTC drugs. Decision regarding hospitalization.   Iv ns. Continuous pulse ox and cardiac monitoring. Labs ordered/sent.  Differential diagnosis includes peripheral vertigo, central vertigo, anemia, dehydration, etc. Dispo decision including potential need for admission considered - will get labs and imaging and reassess.   Reviewed nursing notes and prior charts for additional history. External reports reviewed. Additional history from: EMS.  Acetaminophen po, antivert po, sudafed po. Po fluids/food provided.   Cardiac monitor: sinus rhythm, rate 80.  Labs reviewed/interpreted by me - wbc and hgb normal. K normal.   Recheck, ambulate in hall. Dizziness mildly improved, but remains unsteady and mildly dizzy. Will get MR r/o posterior circ cva.   MRI reviewed/interpreted by me - no acute cva.  Earlier symptoms improved. Pt currently appears stable for d/c.   Rec close pcp f/u.  Return precautions provided.  Final Clinical Impression(s) / ED Diagnoses Final diagnoses:  None    Rx / DC Orders ED Discharge Orders      None         Cathren Laine, MD 06/15/23 1155

## 2023-06-15 NOTE — ED Triage Notes (Addendum)
Pt reports dizziness ever since she woke up at 1am to use the bathroom.  Pt reports dizziness is exacerbated by body position changes and turning her head.  Current sinus congestion and hx of neck fusion and she is having right side neck pain radiating into her shoulder.

## 2023-06-20 ENCOUNTER — Encounter: Payer: Self-pay | Admitting: Internal Medicine

## 2023-06-20 ENCOUNTER — Ambulatory Visit
Admission: EM | Admit: 2023-06-20 | Discharge: 2023-06-20 | Disposition: A | Discharge status: Home / Self Care | Payer: Medicare Other | Attending: Nurse Practitioner | Admitting: Nurse Practitioner

## 2023-06-20 ENCOUNTER — Encounter: Payer: Self-pay | Admitting: Emergency Medicine

## 2023-06-20 DIAGNOSIS — J0191 Acute recurrent sinusitis, unspecified: Secondary | ICD-10-CM | POA: Diagnosis not present

## 2023-06-20 MED ORDER — DOXYCYCLINE HYCLATE 100 MG PO CAPS: 100.0000 mg | ORAL_CAPSULE | Freq: Two times a day (BID) | ORAL | 0 refills | Status: AC

## 2023-06-20 NOTE — Discharge Instructions (Signed)
Stop taking the Moxifloxacin and start taking doxycycline for the sinus infection.  Take with food to prevent GI upset.    Some things that can make you feel better are: - Increased rest - Increasing fluid with water/sugar free electrolytes - Acetaminophen and ibuprofen as needed for fever/pain - Salt water gargling, chloraseptic spray and throat lozenges - OTC guaifenesin (Mucinex) 600 mg twice daily for congestion - Saline sinus flushes or a neti pot for sinus pressure - Humidifying the air

## 2023-06-20 NOTE — ED Provider Notes (Signed)
[facility]    CSN: [id number] Arrival date & time: 06/20/23  1355      History   Chief Complaint No chief complaint on file.   HPI [name] is a 62 y.o. female.   Patient presents today with 4 to 5-day history of congested cough, stuffy nose, postnasal drainage, and severe sinus pressure in her cheeks and above her eyes.  Also endorses headache, nausea, decreased appetite, and fatigue.  Reports she was initially seen in the emergency room for room spinning sensation and subsequently seen a couple days later at an alternate urgent care, was tested for COVID-19, strep, and flu and all were negative.  She was started on moxifloxacin but she has not been able to tolerate it because it makes like "my whole body burn."  She denies fever, body aches or chills, shortness of breath or chest pain, runny nose, ear pain, vomiting, or diarrhea.  She is requesting prednisone and Z-Pak today.   Of note, patient has allergies listed to both Augmentin-nausea/vomiting and doxycycline-nausea.    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   Numbness and tingling of upper and lower extremities of both sides 06/06/2022   Current severe episode of major depressive disorder without psychotic features without prior episode (HCC) 06/06/2022   Tobacco abuse 09/15/2021   Chronic idiopathic constipation 09/15/2021    Diuretic-induced hypokalemia 09/13/2021   DDD (degenerative disc disease), lumbar 03/07/2021   Screening for cervical cancer 07/07/2020   Essential hypertension 03/01/2020   Atherosclerosis of aorta (HCC) 03/31/2019   Dyslipidemia, goal LDL below 100 03/31/2019   Visit for screening mammogram 03/31/2019   Allergic rhinitis 02/22/2017   Overweight (BMI 25.0-29.9) 03/22/2016   GAD (generalized anxiety disorder) 04/13/2010   PREMATURE VENTRICULAR CONTRACTIONS 02/06/2010   MIGRAINE HEADACHE 07/24/2007   GASTROESOPHAGEAL REFLUX DISEASE 07/24/2007    Past Surgical History:  Procedure Laterality Date   BACK SURGERY  10/23/2012   CERVICAL FUSION     CHOLECYSTECTOMY     COLONOSCOPY  2018   MS-suprep(exc)tics/hems-recall 5 yr   LAMINECTOMY     MRI     every 3 mths /due to menigitis   TOTAL ABDOMINAL HYSTERECTOMY     TUBAL LIGATION      OB History   No obstetric history on file.      Home Medications    Prior to Admission medications   Medication Sig Start Date End Date Taking? Authorizing Provider  doxycycline (VIBRAMYCIN) 100 MG capsule Take 1 capsule (100 mg total) by mouth 2 (two) times daily for 7 days. 06/20/23 06/27/23 Yes Valentino Nose, NP  ALPRAZolam Prudy Feeler) 0.5 MG tablet Take 1 tablet (0.5 mg total) by mouth at bedtime. 11/12/22   Etta Grandchild, MD  amLODipine (NORVASC) 5 MG tablet TAKE 1 TABLET(5 MG) BY MOUTH DAILY 05/11/23   Sanda Linger  L, MD  fluticasone (FLONASE) 50 MCG/ACT nasal spray SHAKE LIQUID AND USE 2 SPRAYS IN EACH NOSTRIL DAILY 12/31/22   Etta Grandchild, MD  HYDROcodone-acetaminophen (NORCO/VICODIN) 5-325 MG tablet Take 1 tablet by mouth every 6 (six) hours as needed for moderate pain.    [provider]  indapamide (LOZOL) 2.5 MG tablet TAKE 1 TABLET(2.5 MG) BY MOUTH DAILY 06/27/21   Etta Grandchild, MD  levocetirizine (XYZAL) 5 MG tablet Take 1 tablet (5 mg total) by mouth every evening. 02/13/23   Etta Grandchild, MD  LINZESS 145 MCG CAPS  capsule Take 1 capsule (145 mcg total) by mouth daily before breakfast. 09/15/21   Etta Grandchild, MD  meclizine (ANTIVERT) 12.5 MG tablet Take 1 tablet (12.5 mg total) by mouth 3 (three) times daily as needed for dizziness. 06/15/23   Cathren Laine, MD  mirtazapine (REMERON) 7.5 MG tablet TAKE 1 TABLET(7.5 MG) BY MOUTH AT BEDTIME 09/17/22   Etta Grandchild, MD  pravastatin (PRAVACHOL) 10 MG tablet Take 1 tablet (10 mg total) by mouth daily. Patient not taking: Reported on 05/21/2023 12/28/22   Etta Grandchild, MD  thiamine (VITAMIN B1) 100 MG tablet Take 1 tablet (100 mg total) by mouth daily. Patient not taking: Reported on 05/21/2023 06/12/22   Etta Grandchild, MD  tiZANidine (ZANAFLEX) 2 MG tablet Take 2 mg by mouth 3 (three) times daily as needed. 01/11/21   [provider]  Varenicline Tartrate, Starter, (CHANTIX STARTING MONTH PAK) 0.5 MG X 11 & 1 MG X 42 TBPK Take one 0.5 mg tablet by mouth once daily for 3 days, then increase to one 0.5 mg tablet twice daily for 4 days, then increase to one 1 mg tablet twice daily. 11/12/22   Etta Grandchild, MD    Family History Family History  Problem Relation Age of Onset   Diabetes Mother    Multiple sclerosis Mother    Heart disease Father    Hypertension Sister    Kidney disease Sister    Colon cancer Neg Hx    Colon polyps Neg Hx    Esophageal cancer Neg Hx    Rectal cancer Neg Hx    Stomach cancer Neg Hx    Breast cancer Neg Hx     Social History Social History   Tobacco Use   Smoking status: Former    Current packs/day: 0.00    Average packs/day: 2.0 packs/day for 36.0 years (72.0 ttl pk-yrs)    Types: Cigarettes    Start date: 09/1983    Quit date: 09/2019    Years since quitting: 3.7   Smokeless tobacco: Never  Vaping Use   Vaping status: Never Used  Substance Use Topics   Alcohol use: No   Drug use: No     Allergies   Livalo [pitavastatin], Anesthesia s-i-40 [propofol], Augmentin [amoxicillin-pot clavulanate],  and Doxycycline   Review of Systems Review of Systems Per HPI  Physical Exam Triage Vital Signs ED Triage Vitals  Encounter Vitals Group     BP 06/20/23 1359 (!) 157/89     Systolic BP Percentile --      Diastolic BP Percentile --      Pulse Rate 06/20/23 1359 74     Resp 06/20/23 1359 18     Temp 06/20/23 1359 98.1 F (36.7 C)     Temp Source 06/20/23 1359 Oral     SpO2 06/20/23 1359 97 %     Weight --  Height --      Head Circumference --      Peak Flow --      Pain Score 06/20/23 1402 8     Pain Loc --      Pain Education --      Exclude from Growth Chart --    No data found.  Updated Vital Signs BP (!) 157/89 (BP Location: Right Arm)   Pulse 74   Temp 98.1 F (36.7 C) (Oral)   Resp 18   SpO2 97%   Visual Acuity Right Eye Distance:   Left Eye Distance:   Bilateral Distance:    Right Eye Near:   Left Eye Near:    Bilateral Near:     Physical Exam Vitals and nursing note reviewed.  Constitutional:      General: She is not in acute distress.    Appearance: Normal appearance. She is not ill-appearing or toxic-appearing.  HENT:     Head: Normocephalic and atraumatic.     Right Ear: Tympanic membrane, ear canal and external ear normal.     Left Ear: Tympanic membrane, ear canal and external ear normal.     Nose: No congestion or rhinorrhea.     Right Sinus: Maxillary sinus tenderness and frontal sinus tenderness present.     Left Sinus: Maxillary sinus tenderness and frontal sinus tenderness present.     Mouth/Throat:     Mouth: Mucous membranes are moist.     Pharynx: Oropharynx is clear. No oropharyngeal exudate or posterior oropharyngeal erythema.  Eyes:     General: No scleral icterus.    Extraocular Movements: Extraocular movements intact.  Cardiovascular:     Rate and Rhythm: Normal rate and regular rhythm.  Pulmonary:     Effort: Pulmonary effort is normal. No respiratory distress.     Breath sounds: Normal breath sounds. No wheezing,  rhonchi or rales.  Musculoskeletal:     Cervical back: Normal range of motion and neck supple.  Lymphadenopathy:     Cervical: No cervical adenopathy.  Skin:    General: Skin is warm and dry.     Capillary Refill: Capillary refill takes less than 2 seconds.     Coloration: Skin is not jaundiced or pale.     Findings: No erythema or rash.  Neurological:     Mental Status: She is alert and oriented to person, place, and time.  Psychiatric:        Behavior: Behavior is cooperative.      UC Treatments / Results  Labs (all labs ordered are listed, but only abnormal results are displayed) Labs Reviewed - No data to display  EKG   Radiology No results found.  Procedures Procedures (including critical care time)  Medications Ordered in UC Medications - No data to display  Initial Impression / Assessment and Plan / UC Course  I have reviewed the triage vital signs and the nursing notes.  Pertinent labs & imaging results that were available during my care of the patient were reviewed by me and considered in my medical decision making (see chart for details).   Patient is well-appearing, normotensive, afebrile, not tachycardic, not tachypneic, oxygenating well on room air.    1. Acute recurrent sinusitis, unspecified location Discussed with patient that azithromycin will not treat a sinus infection, I do not believe she needs prednisone at this time as her lungs are clear This also discussed side effects of antibiotic medicine versus true allergic reaction Patient is agreeable to try doxycycline  to treat sinus infection Start saline rinses, Mucinex ER and return precautions discussed with patient  The patient was given the opportunity to ask questions.  All questions answered to their satisfaction.  The patient is in agreement to this plan.    Final Clinical Impressions(s) / UC Diagnoses   Final diagnoses:  Acute recurrent sinusitis, unspecified location     Discharge  Instructions      Stop taking the Moxifloxacin and start taking doxycycline for the sinus infection.  Take with food to prevent GI upset.    Some things that can make you feel better are: - Increased rest - Increasing fluid with water/sugar free electrolytes - Acetaminophen and ibuprofen as needed for fever/pain - Salt water gargling, chloraseptic spray and throat lozenges - OTC guaifenesin (Mucinex) 600 mg twice daily for congestion - Saline sinus flushes or a neti pot for sinus pressure - Humidifying the air     ED Prescriptions     Medication Sig Dispense Auth. Provider   doxycycline (VIBRAMYCIN) 100 MG capsule Take 1 capsule (100 mg total) by mouth 2 (two) times daily for 7 days. 14 capsule Valentino Nose, NP      PDMP not reviewed this encounter.   Valentino Nose, NP 06/20/23 1525

## 2023-06-20 NOTE — ED Triage Notes (Signed)
Nasal congestion and cough.  [facility] and was given Moxifloxacin.  States she feels nauseated.  States she took  one pill yesterday and can't take any more.  States she needs a different antibiotic.   States face is very tender to the touch..  was swabbed for covid, flu and strep which was negative.

## 2023-07-12 ENCOUNTER — Other Ambulatory Visit: Payer: Self-pay | Admitting: Internal Medicine

## 2023-07-12 DIAGNOSIS — J309 Allergic rhinitis, unspecified: Secondary | ICD-10-CM

## 2023-07-22 ENCOUNTER — Ambulatory Visit (INDEPENDENT_AMBULATORY_CARE_PROVIDER_SITE_OTHER): Payer: Medicare Other | Admitting: Internal Medicine

## 2023-07-22 ENCOUNTER — Encounter: Payer: Self-pay | Admitting: Internal Medicine

## 2023-07-22 VITALS — BP 124/70 | HR 70 | Temp 98.0°F | Ht 69.0 in | Wt 157.0 lb

## 2023-07-22 DIAGNOSIS — I10 Essential (primary) hypertension: Secondary | ICD-10-CM | POA: Diagnosis not present

## 2023-07-22 DIAGNOSIS — J301 Allergic rhinitis due to pollen: Secondary | ICD-10-CM | POA: Diagnosis not present

## 2023-07-22 DIAGNOSIS — Z23 Encounter for immunization: Secondary | ICD-10-CM | POA: Diagnosis not present

## 2023-07-22 DIAGNOSIS — J0101 Acute recurrent maxillary sinusitis: Secondary | ICD-10-CM | POA: Diagnosis not present

## 2023-07-22 DIAGNOSIS — K5904 Chronic idiopathic constipation: Secondary | ICD-10-CM

## 2023-07-22 MED ORDER — SHINGRIX 50 MCG/0.5ML IM SUSR: 0.5000 mL | Freq: Once | INTRAMUSCULAR | 1 refills | Status: AC

## 2023-07-22 MED ORDER — METHYLPREDNISOLONE 4 MG PO TBPK: ORAL_TABLET | ORAL | 0 refills | Status: AC

## 2023-07-22 MED ORDER — LINZESS 145 MCG PO CAPS
145.0000 ug | ORAL_CAPSULE | Freq: Every day | ORAL | 1 refills | Status: DC
Start: 2023-07-22 — End: 2024-08-19

## 2023-07-22 MED ORDER — LEVOCETIRIZINE DIHYDROCHLORIDE 5 MG PO TABS
5.0000 mg | ORAL_TABLET | Freq: Every evening | ORAL | 1 refills | Status: DC
Start: 2023-07-22 — End: 2024-03-09

## 2023-07-22 MED ORDER — MOXIFLOXACIN HCL 400 MG PO TABS: 400.0000 mg | ORAL_TABLET | Freq: Every day | ORAL | 0 refills | Status: AC

## 2023-07-22 NOTE — Progress Notes (Signed)
Subjective:  Patient ID: [name], female    DOB: 10/12/1961  Age: 62 y.o. MRN: [id number]  CC: Sinusitis and Allergic Rhinitis    HPI [name] presents for f/up -----  Discussed the use of AI scribe software for clinical note transcription with the patient, who gave verbal consent to proceed.  History of Present Illness   The patient, with a history of vertigo and recurrent sinus infections, presents with ongoing sinus discomfort and pressure. They describe the discomfort as shifting in location, from the forehead to the face, and is associated with clear postnasal drip. They deny nasal bleeding or purulent discharge. They have been using Flonase nasal spray, which sometimes causes dizziness. They have a history of neck surgeries, but do not believe this is related to their current symptoms. They have not taken antibiotics recently due to previous experiences of gastrointestinal upset. They also report a history of migraines, which cause visual disturbances described as 'cracked glass' or 'shattered' vision. They are not currently taking any medication for migraines. They also report taking hydrocodone for pain, prescribed by Dr. Manon Hilding, and Linzess for constipation.       Outpatient Medications Prior to Visit  Medication Sig Dispense Refill   ALPRAZolam (XANAX) 0.5 MG tablet Take 1 tablet (0.5 mg total) by mouth at bedtime. 90 tablet 1   amLODipine (NORVASC) 5 MG tablet TAKE 1 TABLET(5 MG) BY MOUTH DAILY 90 tablet 1   fluticasone (FLONASE) 50 MCG/ACT nasal spray SHAKE LIQUID AND USE 2 SPRAYS IN EACH NOSTRIL DAILY 48 g 1   HYDROcodone-acetaminophen (NORCO/VICODIN) 5-325 MG tablet Take 1 tablet by mouth every 6 (six) hours as needed for moderate pain.     indapamide (LOZOL) 2.5 MG tablet TAKE 1 TABLET(2.5 MG) BY MOUTH DAILY 90 tablet 0   mirtazapine (REMERON) 7.5 MG tablet TAKE 1 TABLET(7.5 MG) BY MOUTH AT BEDTIME 90 tablet 0   pravastatin (PRAVACHOL) 10 MG tablet Take 1 tablet  (10 mg total) by mouth daily. 90 tablet 1   thiamine (VITAMIN B1) 100 MG tablet Take 1 tablet (100 mg total) by mouth daily. 90 tablet 1   tiZANidine (ZANAFLEX) 2 MG tablet Take 2 mg by mouth 3 (three) times daily as needed.     levocetirizine (XYZAL) 5 MG tablet Take 1 tablet (5 mg total) by mouth every evening. 90 tablet 1   LINZESS 145 MCG CAPS capsule Take 1 capsule (145 mcg total) by mouth daily before breakfast. 90 each 1   meclizine (ANTIVERT) 12.5 MG tablet Take 1 tablet (12.5 mg total) by mouth 3 (three) times daily as needed for dizziness. 15 tablet 0   Varenicline Tartrate, Starter, (CHANTIX STARTING MONTH PAK) 0.5 MG X 11 & 1 MG X 42 TBPK Take one 0.5 mg tablet by mouth once daily for 3 days, then increase to one 0.5 mg tablet twice daily for 4 days, then increase to one 1 mg tablet twice daily. 53 each 0   No facility-administered medications prior to visit.    ROS Review of Systems  Constitutional:  Negative for chills, diaphoresis, fatigue and fever.  HENT:  Positive for congestion, postnasal drip, rhinorrhea, sinus pressure and sinus pain. Negative for ear pain, facial swelling, hearing loss, nosebleeds, sore throat, trouble swallowing and voice change.   Eyes: Negative.   Respiratory:  Negative for cough, chest tightness, shortness of breath and wheezing.   Cardiovascular:  Negative for chest pain, palpitations and leg swelling.  Gastrointestinal:  Positive for constipation.  Negative for abdominal pain, diarrhea, nausea and vomiting.  Genitourinary: Negative.  Negative for difficulty urinating.  Musculoskeletal:  Positive for neck pain. Negative for arthralgias and myalgias.  Skin: Negative.   Neurological:  Negative for dizziness, weakness and headaches.  Hematological:  Negative for adenopathy. Does not bruise/bleed easily.  Psychiatric/Behavioral: Negative.      Objective:  BP 124/70 (BP Location: Left Arm, Patient Position: Sitting, Cuff Size: Large)   Pulse 70    Temp 98 F (36.7 C) (Oral)   Ht 5\' 9"  (1.753 m)   Wt 157 lb (71.2 kg)   SpO2 97%   BMI 23.18 kg/m   BP Readings from Last 3 Encounters:  07/22/23 124/70  06/20/23 (!) 157/89  06/15/23 104/64    Wt Readings from Last 3 Encounters:  07/22/23 157 lb (71.2 kg)  06/15/23 156 lb (70.8 kg)  05/21/23 156 lb 8 oz (71 kg)    Physical Exam Vitals reviewed.  Constitutional:      General: She is not in acute distress.    Appearance: She is not ill-appearing, toxic-appearing or diaphoretic.  HENT:     Right Ear: Hearing, tympanic membrane, ear canal and external ear normal.     Left Ear: Hearing, tympanic membrane, ear canal and external ear normal.     Nose: Mucosal edema, congestion and rhinorrhea present. No nasal tenderness. Rhinorrhea is purulent.     Right Nostril: No foreign body, epistaxis, septal hematoma or occlusion.     Left Nostril: No foreign body, epistaxis, septal hematoma or occlusion.     Right Turbinates: Swollen and pale. Not enlarged.     Left Turbinates: Swollen and pale. Not enlarged.     Right Sinus: Maxillary sinus tenderness present. No frontal sinus tenderness.     Left Sinus: Maxillary sinus tenderness present. No frontal sinus tenderness.  Eyes:     General: No scleral icterus.    Conjunctiva/sclera: Conjunctivae normal.  Cardiovascular:     Rate and Rhythm: Normal rate and regular rhythm.     Heart sounds: No murmur heard.    No friction rub. No gallop.  Pulmonary:     Effort: Pulmonary effort is normal.     Breath sounds: No stridor. No wheezing, rhonchi or rales.  Abdominal:     General: Abdomen is flat.     Palpations: There is no mass.     Tenderness: There is no abdominal tenderness. There is no guarding.     Hernia: No hernia is present.  Musculoskeletal:        General: Normal range of motion.     Cervical back: Neck supple.     Right lower leg: No edema.     Left lower leg: No edema.  Lymphadenopathy:     Cervical: No cervical  adenopathy.  Skin:    General: Skin is warm and dry.  Neurological:     General: No focal deficit present.     Mental Status: She is alert. Mental status is at baseline.  Psychiatric:        Mood and Affect: Mood normal.        Behavior: Behavior normal.     Lab Results  Component Value Date   WBC 6.8 06/15/2023   HGB 12.5 06/15/2023   HCT 38.3 06/15/2023   PLT 310 06/15/2023   GLUCOSE 113 (H) 06/15/2023   CHOL 186 11/12/2022   TRIG 81.0 11/12/2022   HDL 67.40 11/12/2022   LDLCALC 102 (H) 11/12/2022   ALT 14  06/06/2022   AST 18 06/06/2022   NA 133 (L) 06/15/2023   K 3.8 06/15/2023   CL 103 06/15/2023   CREATININE 0.67 06/15/2023   BUN 10 06/15/2023   CO2 20 (L) 06/15/2023   TSH 2.08 06/06/2022   HGBA1C 5.6 01/01/2023    MR BRAIN WO CONTRAST  Result Date: 06/15/2023 CLINICAL DATA:  Syncope/presyncope, cerebrovascular cause suspected. EXAM: MRI HEAD WITHOUT CONTRAST TECHNIQUE: Multiplanar, multiecho pulse sequences of the brain and surrounding structures were obtained without intravenous contrast. COMPARISON:  MRI brain 03/20/2020. FINDINGS: Brain: No acute infarct or hemorrhage. No mass or midline shift. No hydrocephalus or extra-axial collection. No abnormal susceptibility. Vascular: Normal flow voids. Skull and upper cervical spine: Normal marrow signal. Sinuses/Orbits: No acute findings. Other: None. IMPRESSION: Normal brain MRI. Electronically Signed   By: Orvan Falconer M.D.   On: 06/15/2023 11:37     Assessment & Plan:  Flu vaccine need -     Flu vaccine trivalent PF, 6mos and older(Flulaval,Afluria,Fluarix,Fluzone)  Seasonal allergic rhinitis due to pollen -     Levocetirizine Dihydrochloride; Take 1 tablet (5 mg total) by mouth every evening.  Dispense: 90 tablet; Refill: 1 -     methylPREDNISolone; TAKE AS DIRECTED  Dispense: 21 tablet; Refill: 0  Essential hypertension - Her BP is well controlled.  Acute recurrent maxillary sinusitis -     Moxifloxacin HCl;  Take 1 tablet (400 mg total) by mouth daily for 7 days.  Dispense: 7 tablet; Refill: 0  Chronic idiopathic constipation -     Linzess; Take 1 capsule (145 mcg total) by mouth daily before breakfast.  Dispense: 90 each; Refill: 1  Need for prophylactic vaccination and inoculation against varicella -     Shingrix; Inject 0.5 mLs into the muscle once for 1 dose.  Dispense: 0.5 mL; Refill: 1     Follow-up: No follow-ups on file.  Sanda Linger, MD

## 2023-07-22 NOTE — Patient Instructions (Signed)

## 2023-07-25 ENCOUNTER — Inpatient Hospital Stay: Admission: RE | Admit: 2023-07-25 | Payer: Medicare Other | Source: Ambulatory Visit

## 2023-08-01 ENCOUNTER — Ambulatory Visit: Payer: Medicare Other | Admitting: Internal Medicine

## 2023-08-09 ENCOUNTER — Encounter: Payer: Self-pay | Admitting: Gastroenterology

## 2023-08-12 ENCOUNTER — Other Ambulatory Visit: Payer: Self-pay | Admitting: Internal Medicine

## 2023-08-12 ENCOUNTER — Encounter: Payer: Medicare Other | Admitting: Neurology

## 2023-08-12 DIAGNOSIS — F411 Generalized anxiety disorder: Secondary | ICD-10-CM

## 2023-08-21 ENCOUNTER — Other Ambulatory Visit: Payer: Self-pay | Admitting: Internal Medicine

## 2023-08-21 DIAGNOSIS — I10 Essential (primary) hypertension: Secondary | ICD-10-CM

## 2023-08-26 ENCOUNTER — Telehealth: Payer: Self-pay

## 2023-08-26 NOTE — Telephone Encounter (Signed)
Multiple attempts made to reach patient for PV. VM left. Patient to call and r/s PV apt by 5 PM to avoid procedure cancellation on 11/11.

## 2023-08-27 ENCOUNTER — Encounter: Payer: Self-pay | Admitting: Gastroenterology

## 2023-08-27 ENCOUNTER — Encounter: Payer: Self-pay | Admitting: Internal Medicine

## 2023-09-05 ENCOUNTER — Ambulatory Visit: Payer: Medicare Other

## 2023-09-05 ENCOUNTER — Encounter: Payer: Self-pay | Admitting: Gastroenterology

## 2023-09-05 VITALS — Ht 69.0 in | Wt 157.0 lb

## 2023-09-05 DIAGNOSIS — Z860100 Personal history of colon polyps, unspecified: Secondary | ICD-10-CM

## 2023-09-05 NOTE — Progress Notes (Signed)
No egg or soy allergy known to patient  No issues known to pt with past sedation with any surgeries or procedures Patient denies ever being told they had issues or difficulty with intubation  No FH of Malignant Hyperthermia Pt is not on diet pills Pt is not on  home 02  Pt is not on blood thinners  Pt denies issues with constipation takes Linzess as needed  No A fib or A flutter Have any cardiac testing pending- no LOA: independent  Prep: suprep  Patient's chart reviewed by Cathlyn Parsons CNRA prior to previsit and patient appropriate for the [facility].  Previsit completed and red dot placed by patient's name on their procedure day (on provider's schedule).     PV competed with patient. Prep instructions sent via mychart and home address. Pt already has prep solution

## 2023-09-09 ENCOUNTER — Encounter: Payer: Medicare Other | Admitting: Gastroenterology

## 2023-09-16 ENCOUNTER — Encounter: Payer: Self-pay | Admitting: Gastroenterology

## 2023-09-16 ENCOUNTER — Ambulatory Visit: Payer: Medicare Other | Admitting: Gastroenterology

## 2023-09-16 VITALS — BP 116/66 | HR 68 | Temp 97.8°F | Resp 16 | Ht 69.0 in | Wt 157.0 lb

## 2023-09-16 DIAGNOSIS — Z860101 Personal history of adenomatous and serrated colon polyps: Secondary | ICD-10-CM

## 2023-09-16 DIAGNOSIS — Z1211 Encounter for screening for malignant neoplasm of colon: Secondary | ICD-10-CM | POA: Diagnosis not present

## 2023-09-16 DIAGNOSIS — Z09 Encounter for follow-up examination after completed treatment for conditions other than malignant neoplasm: Secondary | ICD-10-CM

## 2023-09-16 MED ORDER — SODIUM CHLORIDE 0.9 % IV SOLN: 500.0000 mL | INTRAVENOUS | Status: DC

## 2023-09-16 NOTE — Patient Instructions (Signed)
Please read handouts provided. Continue present medications. Repeat colonoscopy in 10 years for screening. High Fiber Diet.  YOU HAD AN ENDOSCOPIC PROCEDURE TODAY AT THE [facility]:   Refer to the procedure report that was given to you for any specific questions about what was found during the examination.  If the procedure report does not answer your questions, please call your gastroenterologist to clarify.  If you requested that your care partner not be given the details of your procedure findings, then the procedure report has been included in a sealed envelope for you to review at your convenience later.  YOU SHOULD EXPECT: Some feelings of bloating in the abdomen. Passage of more gas than usual.  Walking can help get rid of the air that was put into your GI tract during the procedure and reduce the bloating. If you had a lower endoscopy (such as a colonoscopy or flexible sigmoidoscopy) you may notice spotting of blood in your stool or on the toilet paper. If you underwent a bowel prep for your procedure, you may not have a normal bowel movement for a few days.  Please Note:  You might notice some irritation and congestion in your nose or some drainage.  This is from the oxygen used during your procedure.  There is no need for concern and it should clear up in a day or so.  SYMPTOMS TO REPORT IMMEDIATELY:  Following lower endoscopy (colonoscopy or flexible sigmoidoscopy):  Excessive amounts of blood in the stool  Significant tenderness or worsening of abdominal pains  Swelling of the abdomen that is new, acute  Fever of 100F or higher.  For urgent or emergent issues, a gastroenterologist can be reached at any hour by calling [phone]. Do not use MyChart messaging for urgent concerns.    DIET:  We do recommend a small meal at first, but then you may proceed to your regular diet.  Drink plenty of fluids but you should avoid alcoholic beverages for 24 hours.  ACTIVITY:   You should plan to take it easy for the rest of today and you should NOT DRIVE or use heavy machinery until tomorrow (because of the sedation medicines used during the test).    FOLLOW UP: Our staff will call the number listed on your records the next business day following your procedure.  We will call around 7:15- 8:00 am to check on you and address any questions or concerns that you may have regarding the information given to you following your procedure. If we do not reach you, we will leave a message.     If any biopsies were taken you will be contacted by phone or by letter within the next 1-3 weeks.  Please call us at [phone] if you have not heard about the biopsies in 3 weeks.    SIGNATURES/CONFIDENTIALITY: You and/or your care partner have signed paperwork which will be entered into your electronic medical record.  These signatures attest to the fact that that the information above on your After Visit Summary has been reviewed and is understood.  Full responsibility of the confidentiality of this discharge information lies with you and/or your care-partner.

## 2023-09-16 NOTE — Progress Notes (Signed)
History & Physical  Primary Care Physician:  Etta Grandchild, MD Primary Gastroenterologist: Claudette Head, MD  Impression / Plan:  Personal history of tubulovillous adenoma for surveillance colonoscopy  CHIEF COMPLAINT:  Personal history of colon polyps   HPI: [name] is a 62 y.o. female with a personal history of a tubulovillous adenoma for surveillance colonoscopy.    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 Authorizing Provider  ALPRAZolam Prudy Feeler) 0.5 MG tablet TAKE 1 TABLET(0.5 MG) BY MOUTH AT BEDTIME 08/13/23  Yes Etta Grandchild, MD  amLODipine (NORVASC) 5 MG tablet TAKE 1 TABLET(5 MG) BY MOUTH DAILY 08/21/23  Yes Etta Grandchild, MD  fluticasone (FLONASE) 50 MCG/ACT nasal spray SHAKE LIQUID AND USE 2 SPRAYS IN EACH NOSTRIL DAILY 07/12/23  Yes Etta Grandchild, MD  HYDROcodone-acetaminophen (NORCO/VICODIN) 5-325 MG tablet Take 1 tablet by mouth every 6 (six) hours as needed for moderate pain.   Yes [provider]  levocetirizine (XYZAL) 5 MG tablet Take 1 tablet (5 mg total) by mouth every evening. 07/22/23  Yes Etta Grandchild, MD  LINZESS 145 MCG CAPS capsule Take 1 capsule (145 mcg total) by mouth daily before breakfast. 07/22/23  Yes Etta Grandchild, MD  indapamide (LOZOL) 2.5 MG tablet TAKE 1 TABLET(2.5 MG) BY MOUTH DAILY 06/27/21   Etta Grandchild, MD  mirtazapine (REMERON) 7.5 MG tablet TAKE 1 TABLET(7.5 MG) BY MOUTH AT BEDTIME 09/17/22   Etta Grandchild, MD  tiZANidine (ZANAFLEX) 2 MG tablet Take 2 mg by mouth 3 (three) times daily as needed. 01/11/21   [provider]    Current Outpatient Medications  Medication Sig Dispense Refill   ALPRAZolam (XANAX) 0.5 MG tablet TAKE 1 TABLET(0.5 MG) BY MOUTH AT BEDTIME 90 tablet 2   amLODipine (NORVASC) 5 MG tablet TAKE 1 TABLET(5 MG) BY MOUTH DAILY 90 tablet 0   fluticasone (FLONASE) 50 MCG/ACT nasal spray SHAKE LIQUID AND USE 2 SPRAYS IN EACH NOSTRIL DAILY 48 g 1   HYDROcodone-acetaminophen (NORCO/VICODIN) 5-325 MG tablet Take 1 tablet by mouth every 6 (six) hours as needed for moderate pain.     levocetirizine (XYZAL) 5 MG tablet Take 1 tablet (5 mg total) by mouth every evening. 90 tablet 1   LINZESS 145 MCG CAPS capsule Take 1 capsule (145 mcg total) by mouth daily before breakfast. 90 each 1   indapamide (LOZOL) 2.5 MG tablet TAKE 1 TABLET(2.5 MG) BY MOUTH DAILY 90 tablet 0   mirtazapine (REMERON) 7.5 MG tablet TAKE 1 TABLET(7.5 MG) BY MOUTH AT BEDTIME 90  tablet 0   tiZANidine (ZANAFLEX) 2 MG tablet Take 2 mg by mouth 3 (three) times daily as needed.     Current Facility-Administered Medications  Medication Dose Route Frequency Provider Last Rate Last Admin   0.9 %  sodium chloride infusion  500 mL Intravenous Continuous Meryl Dare, MD        Allergies as of 09/16/2023 - Review Complete 09/16/2023  Allergen Reaction Noted   Livalo [pitavastatin] Other (See Comments) 10/06/2020   Anesthesia s-i-40 [propofol] Itching 04/17/2017   Augmentin [amoxicillin-pot clavulanate] Nausea And Vomiting 10/03/2016   Doxycycline  03/04/2020    Family History  Problem Relation Age of Onset   Diabetes Mother    Multiple sclerosis Mother    Heart disease Father     Hypertension Sister    Kidney disease Sister    Colon cancer Neg Hx    Colon polyps Neg Hx    Esophageal cancer Neg Hx    Rectal cancer Neg Hx    Stomach cancer Neg Hx    Breast cancer Neg Hx     Social History   Socioeconomic History   Marital status: Married    Spouse name: Not on file   Number of children: 2   Years of education: 12   Highest education level: Associate degree: occupational, Scientist, product/process development, or vocational program  Occupational History   Occupation: Retired/disabled  Tobacco Use   Smoking status: Former    Current packs/day: 0.00    Average packs/day: 2.0 packs/day for 36.0 years (72.0 ttl pk-yrs)    Types: Cigarettes    Start date: 09/1983    Quit date: 09/2019    Years since quitting: 3.9   Smokeless tobacco: Never  Vaping Use   Vaping status: Never Used  Substance and Sexual Activity   Alcohol use: No   Drug use: No   Sexual activity: Yes    Partners: Male  Other Topics Concern   Not on file  Social History Narrative   Occupation: retired [employer]   Widowed  (lost husband, son and sister since last seen) 01-01-2023   Alcohol no   Drug use no, no caffiene   Tobacco use- yes quit Dec '08   Regular Exercise no   4 children; reports increased stress in due to caring for grandchildren   UNEMPLOYED      Lives at home    Right-handed   Caffeine: 1-2 cup of coffee    Social Determinants of Health   Financial Resource Strain: Low Risk  (07/18/2023)   Overall Financial Resource Strain (CARDIA)    Difficulty of Paying Living Expenses: Not very hard  Food Insecurity: No Food Insecurity (07/18/2023)   Hunger Vital Sign    Worried About Running Out of Food in the Last Year: Never true    Ran Out of Food in the Last Year: Never true  Transportation Needs: No Transportation Needs (07/18/2023)   PRAPARE - Administrator, Civil Service (Medical): No    Lack of Transportation (Non-Medical): No  Physical Activity: Inactive  (07/18/2023)   Exercise Vital Sign    Days of Exercise per Week: 0 days    Minutes of Exercise per Session: 0 min  Stress: Stress Concern Present (07/18/2023)   [employer] of Occupational Health - Occupational Stress Questionnaire    Feeling of Stress : Very much  Social Connections: Moderately Isolated (07/18/2023)   Social Connection and Isolation Panel [NHANES]    Frequency of Communication with  Friends and Family: More than three times a week    Frequency of Social Gatherings with Friends and Family: Twice a week    Attends Religious Services: More than 4 times per year    Active Member of [employer] or Organizations: No    Attends Banker Meetings: Not on file    Marital Status: Widowed  Intimate Partner Violence: Not At Risk (01/03/2023)   Humiliation, Afraid, Rape, and Kick questionnaire    Fear of Current or Ex-Partner: No    Emotionally Abused: No    Physically Abused: No    Sexually Abused: No    Review of Systems:  All systems reviewed were negative except where noted in HPI.   Physical Exam:  General:  Alert, well-developed, in NAD Head:  Normocephalic and atraumatic. Eyes:  Sclera clear, no icterus.   Conjunctiva pink. Ears:  Normal auditory acuity. Mouth:  No deformity or lesions.  Neck:  Supple; no masses. Lungs:  Clear throughout to auscultation.   No wheezes, crackles, or rhonchi.  Heart:  Regular rate and rhythm; no murmurs. Abdomen:  Soft, nondistended, nontender. No masses, hepatomegaly. No palpable masses.  Normal bowel sounds.    Rectal:  Deferred   Msk:  Symmetrical without gross deformities. Extremities:  Without edema. Neurologic:  Alert and  oriented x 4; grossly normal neurologically. Skin:  Intact without significant lesions or rashes. Psych:  Alert and cooperative. Normal mood and affect.   Venita Lick. Russella Dar  09/16/2023, 8:54 AM See Loretha Stapler, [facility], to contact our on call provider

## 2023-09-16 NOTE — Op Note (Signed)
[facility] Patient Name: [name] Procedure Date: 09/16/2023 8:49 AM MRN: [id number] Endoscopist: Meryl Dare , MD, [phone] Age: 62 Referring MD:  Date of Birth: 1961-02-01 Gender: Female Account #: [id number] Procedure:                Colonoscopy Indications:              Surveillance: Personal history of adenomatous                            polyps on last colonoscopy 5 years ago Medicines:                Monitored Anesthesia Care Procedure:                Pre-Anesthesia Assessment:                           - Prior to the procedure, a History and Physical                            was performed, and patient medications and                            allergies were reviewed. The patient's tolerance of                            previous anesthesia was also reviewed. The risks                            and benefits of the procedure and the sedation                            options and risks were discussed with the patient.                            All questions were answered, and informed consent                            was obtained. Prior Anticoagulants: The patient has                            taken no anticoagulant or antiplatelet agents. ASA                            Grade Assessment: II - A patient with mild systemic                            disease. After reviewing the risks and benefits,                            the patient was deemed in satisfactory condition to                            undergo the procedure.  After obtaining informed consent, the colonoscope                            was passed under direct vision. Throughout the                            procedure, the patient's blood pressure, pulse, and                            oxygen saturations were monitored continuously. The                            Olympus Scope [id number] was introduced through the                            anus and advanced to  the the cecum, identified by                            appendiceal orifice and ileocecal valve. The                            ileocecal valve, appendiceal orifice, and rectum                            were photographed. The quality of the bowel                            preparation was good. The colonoscopy was performed                            without difficulty. The patient tolerated the                            procedure well. Scope In: 9:05:14 AM Scope Out: 9:15:50 AM Scope Withdrawal Time: 0 hours 7 minutes 25 seconds  Total Procedure Duration: 0 hours 10 minutes 36 seconds  Findings:                 The perianal and digital rectal examinations were                            normal.                           Multiple large-mouthed, medium-mouthed and                            small-mouthed diverticula were found in the left                            colon. There was evidence of an impacted                            diverticulum. There was no evidence of diverticular  bleeding.                           Internal hemorrhoids were found during                            retroflexion. The hemorrhoids were small and Grade                            I (internal hemorrhoids that do not prolapse).                           The exam was otherwise without abnormality on                            direct and retroflexion views. Complications:            No immediate complications. Estimated blood loss:                            None. Estimated Blood Loss:     Estimated blood loss: none. Impression:               - Moderate diverticulosis in the left colon.                           - Internal hemorrhoids.                           - The examination was otherwise normal on direct                            and retroflexion views.                           - No specimens collected. Recommendation:           - Repeat colonoscopy in 10 years for  surveillance.                           - Patient has a contact number available for                            emergencies. The signs and symptoms of potential                            delayed complications were discussed with the                            patient. Return to normal activities tomorrow.                            Written discharge instructions were provided to the                            patient.                           -  Resume previous diet adding high fiber.                           - Continue present medications. Meryl Dare, MD 09/16/2023 9:19:42 AM This report has been signed electronically.

## 2023-09-16 NOTE — Progress Notes (Signed)
To pacu, VSS. Report to [email or web address] 

## 2023-09-16 NOTE — Progress Notes (Signed)
Patient states there have been no changes to medical or surgical history since time of pre-visit. 

## 2023-09-17 ENCOUNTER — Telehealth: Payer: Self-pay | Admitting: *Deleted

## 2023-09-17 NOTE — Telephone Encounter (Signed)
  Follow up Call-     09/16/2023    7:40 AM  Call back number  Post procedure Call Back phone  # [phone]  Permission to leave phone message Yes     Patient questions:  Do you have a fever, pain , or abdominal swelling? No. Pain Score  0 *  Have you tolerated food without any problems? Yes.    Have you been able to return to your normal activities? Yes.    Do you have any questions about your discharge instructions: Diet   No. Medications  No. Follow up visit  No.  Do you have questions or concerns about your Care? No.  Actions: * If pain score is 4 or above: No action needed, pain <4.

## 2023-09-24 ENCOUNTER — Other Ambulatory Visit: Payer: Self-pay | Admitting: Internal Medicine

## 2023-09-24 DIAGNOSIS — Z720 Tobacco use: Secondary | ICD-10-CM

## 2023-10-09 ENCOUNTER — Other Ambulatory Visit: Payer: Self-pay

## 2023-10-09 DIAGNOSIS — Z122 Encounter for screening for malignant neoplasm of respiratory organs: Secondary | ICD-10-CM

## 2023-10-09 DIAGNOSIS — Z87891 Personal history of nicotine dependence: Secondary | ICD-10-CM

## 2023-10-12 ENCOUNTER — Other Ambulatory Visit: Payer: Self-pay | Admitting: Internal Medicine

## 2023-10-12 DIAGNOSIS — I700 Atherosclerosis of aorta: Secondary | ICD-10-CM

## 2023-10-12 DIAGNOSIS — E785 Hyperlipidemia, unspecified: Secondary | ICD-10-CM

## 2023-10-12 MED ORDER — ROSUVASTATIN CALCIUM 5 MG PO TABS
5.0000 mg | ORAL_TABLET | Freq: Every day | ORAL | 0 refills | Status: DC
Start: 2023-10-12 — End: 2023-12-04

## 2023-10-12 NOTE — Progress Notes (Unsigned)
Lab Results  Component Value Date   WBC 6.8 06/15/2023   HGB 12.5 06/15/2023   HCT 38.3 06/15/2023   PLT 310 06/15/2023   GLUCOSE 113 (H) 06/15/2023   CHOL 186 11/12/2022   TRIG 81.0 11/12/2022   HDL 67.40 11/12/2022   LDLCALC 102 (H) 11/12/2022   ALT 14 06/06/2022   AST 18 06/06/2022   NA 133 (L) 06/15/2023   K 3.8 06/15/2023   CL 103 06/15/2023   CREATININE 0.67 06/15/2023   BUN 10 06/15/2023   CO2 20 (L) 06/15/2023   TSH 2.08 06/06/2022   HGBA1C 5.6 01/01/2023

## 2023-11-04 ENCOUNTER — Ambulatory Visit (INDEPENDENT_AMBULATORY_CARE_PROVIDER_SITE_OTHER): Payer: Self-pay | Admitting: Neurology

## 2023-11-04 ENCOUNTER — Ambulatory Visit (INDEPENDENT_AMBULATORY_CARE_PROVIDER_SITE_OTHER): Payer: Medicare Other | Admitting: Neurology

## 2023-11-04 DIAGNOSIS — R202 Paresthesia of skin: Secondary | ICD-10-CM

## 2023-11-04 DIAGNOSIS — Z0289 Encounter for other administrative examinations: Secondary | ICD-10-CM

## 2023-11-04 NOTE — Progress Notes (Signed)
 Full Name: [name] Gender: Female MRN #: [id number] Date of Birth: 08-Nov-1960    Visit Date: 11/04/2023 12:26 Age: 63 Years Examining Physician: Dr. Onetha Epp Referring Physician: Greig Forbes, NP Height: 5 feet 9 inch   History; Patient with paresthesias in the right upper extremity, chronic neck pain. Paresthesias in the legs are better. She has had multiple surgeries of the cervical spine.   Summary: Nerve conduction studies performed on the right upper and right lower extremities. All nerves (as indicated in the following tables) were within normal limits.  Limited/incomplete EMG due to patient not tolerating the needle study, right deltoid only performed. All muscles (as indicated in the following tables) were within normal limits.        Conclusion: Nerve conduction studies of the right lower and right upper extremities were normal, no indication of mononeuropathy(Median, Ulnar, Peroneal or other) or polyneuropathy. Limited/incomplete EMG due to patient not tolerating the needle study, right deltoid only performed and normal. Given limited EMG exam, cannot determine if there is any acute denervation or chronic neurogenic changes in muscles that could be attributed to spine disease or radiculopathy.    ------------------------------- Onetha Epp, M.D.  CC: Dr. Cozetta [facility] [address] [place], [place] [address] Tel: [phone] Fax: [phone]  Verbal informed consent was obtained from the patient, patient was informed of potential risk of procedure, including bruising, bleeding, hematoma formation, infection, muscle weakness, muscle pain, numbness, among others.        MNC    Nerve / Sites Muscle Latency Ref. Amplitude Ref. Rel Amp Segments Distance Velocity Ref. Area    ms ms mV mV %  cm m/s m/s mVms  R Median - APB     Wrist APB 3.5 <=4.4 4.6 >=4.0 100 Wrist - APB 7   20.8     Upper arm APB 7.8  4.0  85.8  Upper arm - Wrist 24 56 >=49 18.9  R Ulnar - ADM     Wrist ADM 2.9 <=3.3 7.7 >=6.0 100 Wrist - ADM 7   23.6     B.Elbow ADM 4.8  5.7  73.5 B.Elbow - Wrist 12 63 >=49 18.4     A.Elbow ADM 7.8  7.7  136 A.Elbow - B.Elbow 17 57 >=49 23.8  R Peroneal - EDB     Ankle EDB 5.5 <=6.5 3.6 >=2.0 100 Ankle - EDB 9   10.9     Fib head EDB 11.4  3.4  94.8 Fib head - Ankle 29 50 >=44 11.1     Pop fossa EDB 13.2  3.3  98.1 Pop fossa - Fib head 10 55 >=44 11.4         Pop fossa - Ankle      R Tibial - AH     Ankle AH 4.8 <=5.8 5.9 >=4.0 100 Ankle - AH 9   15.4     Pop fossa AH 12.9  5.6  94.2 Pop fossa - Ankle 37 45 >=41 14.6             SNC    Nerve / Sites Rec. Site Peak Lat Ref.  Amp Ref. Segments Distance Peak Diff Ref.    ms ms V V  cm ms ms  R Sural - Ankle (Calf)     Calf Ankle 4.1 <=4.4 17 >=6 Calf - Ankle 14    R Superficial peroneal - Ankle     Lat  leg Ankle 4.0 <=4.4 11 >=6 Lat leg - Ankle 14    R Median, Ulnar - Transcarpal comparison     Median Palm Wrist 2.2 <=2.2 83 >=35 Median Palm - Wrist 8       Ulnar Palm Wrist 2.1 <=2.2 28 >=12 Ulnar Palm - Wrist 8          Median Palm - Ulnar Palm  0.1 <=0.4  R Median - Orthodromic (Dig II, Mid palm)     Dig II Wrist 3.4 <=3.4 12 >=10 Dig II - Wrist 13    R Ulnar - Orthodromic, (Dig V, Mid palm)     Dig V Wrist 2.9 <=3.1 8 >=5 Dig V - Wrist 47                 F  Wave    Nerve F Lat Ref.   ms ms  R Tibial - AH 47.7 <=56.0  R Ulnar - ADM 27.2 <=32.0         EMG Summary Table    Spontaneous MUAP Recruitment  Muscle IA Fib PSW Fasc Other Amp Dur. Poly Pattern  R. Deltoid Normal None None None _______ Normal Normal Normal Normal

## 2023-11-06 ENCOUNTER — Other Ambulatory Visit: Payer: Self-pay | Admitting: Internal Medicine

## 2023-11-06 ENCOUNTER — Ambulatory Visit (HOSPITAL_BASED_OUTPATIENT_CLINIC_OR_DEPARTMENT_OTHER): Payer: Medicare Other

## 2023-11-06 DIAGNOSIS — Z1231 Encounter for screening mammogram for malignant neoplasm of breast: Secondary | ICD-10-CM

## 2023-11-06 NOTE — Progress Notes (Signed)
 See procedure note.

## 2023-11-19 ENCOUNTER — Encounter: Payer: Self-pay | Admitting: Cardiology

## 2023-11-19 ENCOUNTER — Ambulatory Visit: Payer: Medicare Other | Attending: Cardiology | Admitting: Cardiology

## 2023-11-19 VITALS — BP 114/84 | HR 83 | Ht 68.0 in | Wt 172.0 lb

## 2023-11-19 DIAGNOSIS — I4729 Other ventricular tachycardia: Secondary | ICD-10-CM

## 2023-11-19 DIAGNOSIS — I2510 Atherosclerotic heart disease of native coronary artery without angina pectoris: Secondary | ICD-10-CM

## 2023-11-19 DIAGNOSIS — F17201 Nicotine dependence, unspecified, in remission: Secondary | ICD-10-CM

## 2023-11-19 NOTE — Progress Notes (Signed)
    Cardiology Office Note  Date: 11/19/2023   ID: [name], DOB [birth year], MRN [id number]  History of Present Illness: [name] is a 63 y.o. female last seen in January 2024.  She is here for a follow-up visit.  Reports no interval chest pain or palpitations, no sudden dizziness or syncope.  I reviewed her medications.  Current regimen includes Norvasc and Crestor (on her list, but she states that she is not taking it, I asked her to clarify this with Dr. Yetta Barre).  Reviewed her interval ECG from August 2024.  Also went over her lab work.  She states that she was able to stop smoking about a year ago and has had no recurrences.  Physical Exam: VS:  BP 114/84   Pulse 83   Ht 5\' 8"  (1.727 m)   Wt 172 lb (78 kg)   SpO2 99%   BMI 26.15 kg/m , BMI Body mass index is 26.15 kg/m.  Wt Readings from Last 3 Encounters:  11/19/23 172 lb (78 kg)  09/16/23 157 lb (71.2 kg)  09/05/23 157 lb (71.2 kg)    General: Patient appears comfortable at rest. HEENT: Conjunctiva and lids normal. Neck: Supple, no elevated JVP or carotid bruits. Lungs: Clear to auscultation, nonlabored breathing at rest. Cardiac: Regular rate and rhythm, no S3 or significant systolic murmur.  ECG:  An ECG dated 06/15/2023 was personally reviewed today and demonstrated:  Sinus rhythm.  Labwork: 06/15/2023: BUN 10; Creatinine, Ser 0.67; Hemoglobin 12.5; Platelets 310; Potassium 3.8; Sodium 133     Component Value Date/Time   CHOL 186 11/12/2022 1019   TRIG 81.0 11/12/2022 1019   HDL 67.40 11/12/2022 1019   CHOLHDL 3 11/12/2022 1019   VLDL 16.2 11/12/2022 1019   LDLCALC 102 (H) 11/12/2022 1019   LDLCALC 118 (H) 07/07/2020 1130   Other Studies Reviewed Today:  No interval cardiac testing for review today.  Assessment and Plan:  1.  History of RVOT PVCs.  On Sectral and then pindolol years past, but has not required specific therapy more recently.  Cardiac monitor from June 2022 showed less than 1%  PVC burden.  Echocardiogram at that time showed normal LV and RV contraction.  She does not report any palpitations or syncope.  Interval ECG reviewed.  Continue with observation.   2.  Incidentally noted coronary artery and aortic calcification by CT imaging from September 2023.  Agree with statin therapy, she will clarify Crestor with Dr. Yetta Barre.   3.  Tobacco abuse in remission.  Disposition:  Follow up  1 year.  Signed, Jonelle Sidle, M.D., F.A.C.C. [facility]

## 2023-11-19 NOTE — Patient Instructions (Signed)

## 2023-11-21 ENCOUNTER — Ambulatory Visit (HOSPITAL_BASED_OUTPATIENT_CLINIC_OR_DEPARTMENT_OTHER): Payer: Medicare Other

## 2023-11-26 ENCOUNTER — Ambulatory Visit: Payer: Medicare Other

## 2023-12-04 ENCOUNTER — Encounter: Payer: Self-pay | Admitting: Internal Medicine

## 2023-12-04 ENCOUNTER — Ambulatory Visit (INDEPENDENT_AMBULATORY_CARE_PROVIDER_SITE_OTHER): Payer: Medicare Other | Admitting: Internal Medicine

## 2023-12-04 VITALS — BP 134/82 | HR 74 | Temp 98.1°F | Ht 68.0 in | Wt 173.1 lb

## 2023-12-04 DIAGNOSIS — E785 Hyperlipidemia, unspecified: Secondary | ICD-10-CM

## 2023-12-04 DIAGNOSIS — I10 Essential (primary) hypertension: Secondary | ICD-10-CM

## 2023-12-04 DIAGNOSIS — R739 Hyperglycemia, unspecified: Secondary | ICD-10-CM

## 2023-12-04 DIAGNOSIS — Z0000 Encounter for general adult medical examination without abnormal findings: Secondary | ICD-10-CM

## 2023-12-04 DIAGNOSIS — I700 Atherosclerosis of aorta: Secondary | ICD-10-CM | POA: Diagnosis not present

## 2023-12-04 DIAGNOSIS — K219 Gastro-esophageal reflux disease without esophagitis: Secondary | ICD-10-CM

## 2023-12-04 DIAGNOSIS — Z0001 Encounter for general adult medical examination with abnormal findings: Secondary | ICD-10-CM

## 2023-12-04 LAB — LIPID PANEL
Cholesterol: 183 mg/dL (ref 0–200)
HDL: 64.4 mg/dL (ref >39.00)
LDL Cholesterol: 100 mg/dL — ABNORMAL HIGH (ref 0–99)
NonHDL: 118.66
Total CHOL/HDL Ratio: 3
Triglycerides: 93 mg/dL (ref 0.0–149.0)
VLDL: 18.6 mg/dL (ref 0.0–40.0)

## 2023-12-04 LAB — BASIC METABOLIC PANEL
BUN: 8 mg/dL (ref 6–23)
CO2: 27 meq/L (ref 19–32)
Calcium: 9.4 mg/dL (ref 8.4–10.5)
Chloride: 105 meq/L (ref 96–112)
Creatinine, Ser: 0.71 mg/dL (ref 0.40–1.20)
GFR: 90.97 mL/min (ref >60.00)
Glucose, Bld: 99 mg/dL (ref 70–99)
Potassium: 3.8 meq/L (ref 3.5–5.1)
Sodium: 139 meq/L (ref 135–145)

## 2023-12-04 LAB — CBC WITH DIFFERENTIAL/PLATELET
Basophils Absolute: 0.1 10*3/uL (ref 0.0–0.1)
Basophils Relative: 1 % (ref 0.0–3.0)
Eosinophils Absolute: 0.1 10*3/uL (ref 0.0–0.7)
Eosinophils Relative: 1.6 % (ref 0.0–5.0)
HCT: 41.9 % (ref 36.0–46.0)
Hemoglobin: 13.6 g/dL (ref 12.0–15.0)
Lymphocytes Relative: 45.7 % (ref 12.0–46.0)
Lymphs Abs: 2.8 10*3/uL (ref 0.7–4.0)
MCHC: 32.3 g/dL (ref 30.0–36.0)
MCV: 89.8 fL (ref 78.0–100.0)
Monocytes Absolute: 0.3 10*3/uL (ref 0.1–1.0)
Monocytes Relative: 5.2 % (ref 3.0–12.0)
Neutro Abs: 2.8 10*3/uL (ref 1.4–7.7)
Neutrophils Relative %: 46.5 % (ref 43.0–77.0)
Platelets: 371 10*3/uL (ref 150.0–400.0)
RBC: 4.67 Mil/uL (ref 3.87–5.11)
RDW: 12.8 % (ref 11.5–15.5)
WBC: 6.1 10*3/uL (ref 4.0–10.5)

## 2023-12-04 LAB — URINALYSIS, ROUTINE W REFLEX MICROSCOPIC
Bilirubin Urine: NEGATIVE
Hgb urine dipstick: NEGATIVE
Ketones, ur: NEGATIVE
Nitrite: NEGATIVE
Specific Gravity, Urine: 1.01 (ref 1.000–1.030)
Total Protein, Urine: NEGATIVE
Urine Glucose: NEGATIVE
Urobilinogen, UA: 0.2 (ref 0.0–1.0)
pH: 7 (ref 5.0–8.0)

## 2023-12-04 LAB — HEPATIC FUNCTION PANEL
ALT: 17 U/L (ref 0–35)
AST: 18 U/L (ref 0–37)
Albumin: 4.4 g/dL (ref 3.5–5.2)
Alkaline Phosphatase: 76 U/L (ref 39–117)
Bilirubin, Direct: 0.2 mg/dL (ref 0.0–0.3)
Total Bilirubin: 1 mg/dL (ref 0.2–1.2)
Total Protein: 8.2 g/dL (ref 6.0–8.3)

## 2023-12-04 LAB — TSH: TSH: 2 u[IU]/mL (ref 0.35–5.50)

## 2023-12-04 LAB — CK: Total CK: 81 U/L (ref 7–177)

## 2023-12-04 LAB — HEMOGLOBIN A1C: Hgb A1c MFr Bld: 5.5 % (ref 4.6–6.5)

## 2023-12-04 NOTE — Progress Notes (Signed)
 Subjective:  Patient ID: [name], female    DOB: Mar 01, 1961  Age: 63 y.o. MRN: [id number]  CC: Annual Exam, Hypertension, Hyperlipidemia, and Osteoarthritis   HPI [name] presents for a CPX and f/up -  Discussed the use of AI scribe software for clinical note transcription with the patient, who gave verbal consent to proceed.  History of Present Illness   [name] is a 63 year old female who presents with generalized body aches and dizziness.  She experiences generalized body aches, describing it as 'aching all over.' She is currently managed with hydrocodone . A nerve conduction study returned normal results, indicating no neuropathy. She was previously on rosuvastatin  for cholesterol but was taken off due to body aches.  She reports episodes of dizziness and vertigo, stating 'I be so dizzy like God knows I do.' This has been ongoing for a while, with a severe episode where she could not get out of bed due to vertigo. She has been prescribed meclizine  for vertigo. No chest pain or shortness of breath.       Outpatient Medications Prior to Visit  Medication Sig Dispense Refill   ALPRAZolam  (XANAX ) 0.5 MG tablet TAKE 1 TABLET(0.5 MG) BY MOUTH AT BEDTIME 90 tablet 2   amLODipine  (NORVASC ) 5 MG tablet TAKE 1 TABLET(5 MG) BY MOUTH DAILY 90 tablet 0   fluticasone  (FLONASE ) 50 MCG/ACT nasal spray SHAKE LIQUID AND USE 2 SPRAYS IN EACH NOSTRIL DAILY 48 g 1   HYDROcodone -acetaminophen  (NORCO/VICODIN) 5-325 MG tablet Take 1 tablet by mouth every 6 (six) hours as needed for moderate pain.     indapamide  (LOZOL ) 2.5 MG tablet TAKE 1 TABLET(2.5 MG) BY MOUTH DAILY 90 tablet 0   levocetirizine (XYZAL ) 5 MG tablet Take 1 tablet (5 mg total) by mouth every evening. 90 tablet 1   LINZESS  145 MCG CAPS capsule Take 1 capsule (145 mcg total) by mouth daily before breakfast. 90 each 1   mirtazapine  (REMERON ) 7.5 MG tablet TAKE 1 TABLET(7.5 MG) BY MOUTH AT BEDTIME 90 tablet 0    tiZANidine (ZANAFLEX) 2 MG tablet Take 2 mg by mouth 3 (three) times daily as needed.     rosuvastatin  (CRESTOR ) 5 MG tablet Take 1 tablet (5 mg total) by mouth daily. 90 tablet 0   No facility-administered medications prior to visit.    ROS Review of Systems  Musculoskeletal:  Positive for arthralgias. Negative for myalgias.    Objective:  BP 134/82   Pulse 74   Temp 98.1 F (36.7 C) (Temporal)   Ht 5' 8 (1.727 m)   Wt 173 lb 2 oz (78.5 kg)   SpO2 96%   BMI 26.32 kg/m   BP Readings from Last 3 Encounters:  12/04/23 134/82  11/19/23 114/84  09/16/23 116/66    Wt Readings from Last 3 Encounters:  12/04/23 173 lb 2 oz (78.5 kg)  11/19/23 172 lb (78 kg)  09/16/23 157 lb (71.2 kg)    Physical Exam Vitals reviewed.  Constitutional:      Appearance: Normal appearance.  HENT:     Mouth/Throat:     Mouth: Mucous membranes are moist.  Eyes:     General: No scleral icterus.    Conjunctiva/sclera: Conjunctivae normal.  Cardiovascular:     Rate and Rhythm: Normal rate and regular rhythm.     Heart sounds: No murmur heard.    No friction rub. No gallop.     Comments: EKG--- NSR, 65 bpm NS ST changes  No LVH or Q waves Pulmonary:     Effort: Pulmonary effort is normal.     Breath sounds: No stridor. No wheezing, rhonchi or rales.  Abdominal:     General: Abdomen is flat.     Palpations: There is no mass.     Tenderness: There is no abdominal tenderness. There is no guarding.     Hernia: No hernia is present.  Musculoskeletal:        General: Normal range of motion.     Cervical back: Neck supple.     Right lower leg: No edema.     Left lower leg: No edema.  Lymphadenopathy:     Cervical: No cervical adenopathy.  Skin:    General: Skin is warm and dry.  Neurological:     General: No focal deficit present.     Mental Status: She is alert.  Psychiatric:        Mood and Affect: Mood normal.        Behavior: Behavior normal.     Lab Results  Component Value  Date   WBC 6.1 12/04/2023   HGB 13.6 12/04/2023   HCT 41.9 12/04/2023   PLT 371.0 12/04/2023   GLUCOSE 99 12/04/2023   CHOL 183 12/04/2023   TRIG 93.0 12/04/2023   HDL 64.40 12/04/2023   LDLCALC 100 (H) 12/04/2023   ALT 17 12/04/2023   AST 18 12/04/2023   NA 139 12/04/2023   K 3.8 12/04/2023   CL 105 12/04/2023   CREATININE 0.71 12/04/2023   BUN 8 12/04/2023   CO2 27 12/04/2023   TSH 2.00 12/04/2023   HGBA1C 5.5 12/04/2023    No results found.  Assessment & Plan:   Dyslipidemia, goal LDL below 100- She did not tolerate the statin. -     Lipid panel; Future -     TSH; Future -     Hepatic function panel; Future -     CK; Future  Essential hypertension- Her BP is well controlled. -     TSH; Future -     Basic metabolic panel; Future -     Hepatic function panel; Future -     CBC with Differential/Platelet; Future -     EKG 12-Lead -     Urinalysis, Routine w reflex microscopic; Future  Encounter for general adult medical examination with abnormal findings- Exam completed, labs reviewed, vaccines reviewed, cancer screenings addressed, pt ed material was given.   Atherosclerosis of aorta (HCC)  Chronic hyperglycemia -     Hemoglobin A1c; Future  Gastroesophageal reflux disease without esophagitis -     CBC with Differential/Platelet; Future     Follow-up: Return in about 6 months (around 06/02/2024).  Debby Molt, MD

## 2023-12-04 NOTE — Patient Instructions (Signed)

## 2023-12-05 ENCOUNTER — Encounter: Payer: Self-pay | Admitting: Internal Medicine

## 2023-12-10 ENCOUNTER — Ambulatory Visit (HOSPITAL_BASED_OUTPATIENT_CLINIC_OR_DEPARTMENT_OTHER): Admission: RE | Admit: 2023-12-10 | Payer: Medicare Other | Source: Ambulatory Visit | Admitting: Radiology

## 2023-12-10 ENCOUNTER — Ambulatory Visit (HOSPITAL_BASED_OUTPATIENT_CLINIC_OR_DEPARTMENT_OTHER): Payer: Medicare Other

## 2024-01-02 ENCOUNTER — Ambulatory Visit (HOSPITAL_BASED_OUTPATIENT_CLINIC_OR_DEPARTMENT_OTHER)
Admission: RE | Admit: 2024-01-02 | Discharge: 2024-01-02 | Disposition: A | Discharge status: Home / Self Care | Payer: Medicare Other | Source: Ambulatory Visit | Attending: Acute Care | Admitting: Acute Care

## 2024-01-02 ENCOUNTER — Ambulatory Visit (HOSPITAL_BASED_OUTPATIENT_CLINIC_OR_DEPARTMENT_OTHER)
Admission: RE | Admit: 2024-01-02 | Discharge: 2024-01-02 | Disposition: A | Discharge status: Home / Self Care | Payer: Medicare Other | Source: Ambulatory Visit | Attending: Internal Medicine | Admitting: Internal Medicine

## 2024-01-02 ENCOUNTER — Encounter (HOSPITAL_BASED_OUTPATIENT_CLINIC_OR_DEPARTMENT_OTHER): Payer: Self-pay | Admitting: Radiology

## 2024-01-02 DIAGNOSIS — Z122 Encounter for screening for malignant neoplasm of respiratory organs: Secondary | ICD-10-CM | POA: Insufficient documentation

## 2024-01-02 DIAGNOSIS — Z87891 Personal history of nicotine dependence: Secondary | ICD-10-CM | POA: Insufficient documentation

## 2024-01-02 DIAGNOSIS — Z1231 Encounter for screening mammogram for malignant neoplasm of breast: Secondary | ICD-10-CM | POA: Diagnosis present

## 2024-01-06 ENCOUNTER — Ambulatory Visit (INDEPENDENT_AMBULATORY_CARE_PROVIDER_SITE_OTHER): Payer: Medicare Other

## 2024-01-06 VITALS — Ht 71.5 in | Wt 173.0 lb

## 2024-01-06 DIAGNOSIS — Z0000 Encounter for general adult medical examination without abnormal findings: Secondary | ICD-10-CM | POA: Diagnosis not present

## 2024-01-06 NOTE — Progress Notes (Signed)
 Subjective:   [name] is a 63 y.o. who presents for a Medicare Wellness preventive visit.  Visit Complete: Virtual I connected with  Earnstine Regal on 01/06/24 by a audio enabled telemedicine application and verified that I am speaking with the correct person using two identifiers.  Patient Location: Home  Provider Location: Office/Clinic  I discussed the limitations of evaluation and management by telemedicine. The patient expressed understanding and agreed to proceed.  Vital Signs: Because this visit was a virtual/telehealth visit, some criteria may be missing or patient reported. Any vitals not documented were not able to be obtained and vitals that have been documented are patient reported.  VideoDeclined- This patient declined Librarian, academic. Therefore the visit was completed with audio only.  AWV Questionnaire: No: Patient Medicare AWV questionnaire was not completed prior to this visit.  Cardiac Risk Factors include: advanced age (>73men, >73 women);hypertension;dyslipidemia;smoking/ tobacco exposure (pt is currently smoking)     Objective:    Today's Vitals   01/06/24 1544  Weight: 173 lb (78.5 kg)  Height: 5' 11.5" (1.816 m)   Body mass index is 23.79 kg/m.     01/06/2024    3:42 PM 06/15/2023    8:59 AM 01/03/2023    4:12 PM 02/21/2022    6:33 PM 12/19/2021    1:36 PM 12/15/2020   12:40 PM 08/20/2017    2:43 PM  Advanced Directives  Does Patient Have a Medical Advance Directive? No No No No No No No  Would patient like information on creating a medical advance directive? Yes (MAU/Ambulatory/Procedural Areas - Information given)  No - Patient declined No - Patient declined No - Patient declined Yes (MAU/Ambulatory/Procedural Areas - Information given)     Current Medications (verified) Outpatient Encounter Medications as of 01/06/2024  Medication Sig   ALPRAZolam (XANAX) 0.5 MG tablet TAKE 1 TABLET(0.5 MG) BY MOUTH AT  BEDTIME   amLODipine (NORVASC) 5 MG tablet TAKE 1 TABLET(5 MG) BY MOUTH DAILY   fluticasone (FLONASE) 50 MCG/ACT nasal spray SHAKE LIQUID AND USE 2 SPRAYS IN EACH NOSTRIL DAILY   HYDROcodone-acetaminophen (NORCO/VICODIN) 5-325 MG tablet Take 1 tablet by mouth every 6 (six) hours as needed for moderate pain.   indapamide (LOZOL) 2.5 MG tablet TAKE 1 TABLET(2.5 MG) BY MOUTH DAILY   levocetirizine (XYZAL) 5 MG tablet Take 1 tablet (5 mg total) by mouth every evening.   LINZESS 145 MCG CAPS capsule Take 1 capsule (145 mcg total) by mouth daily before breakfast.   mirtazapine (REMERON) 7.5 MG tablet TAKE 1 TABLET(7.5 MG) BY MOUTH AT BEDTIME   tiZANidine (ZANAFLEX) 2 MG tablet Take 2 mg by mouth 3 (three) times daily as needed.   No facility-administered encounter medications on file as of 01/06/2024.    Allergies (verified) Livalo [pitavastatin], Anesthesia s-i-40 [propofol], Augmentin [amoxicillin-pot clavulanate], and Doxycycline   History: Past Medical History:  Diagnosis Date   Anxiety    on meds   Depression    on meds   DJD (degenerative joint disease)    back/neck   GERD (gastroesophageal reflux disease)    History of palpitations    Migraine headache    Neuromuscular disorder (HCC)    on meds   Occipital neuralgia    PVC's (premature ventricular contractions)    Originating from [facility]   Seasonal allergies    Past Surgical History:  Procedure Laterality Date   BACK SURGERY  10/23/2012   CERVICAL FUSION     CHOLECYSTECTOMY  COLONOSCOPY  2018   MS-suprep(exc)tics/hems-recall 5 yr   LAMINECTOMY     MRI     every 3 mths /due to menigitis   TOTAL ABDOMINAL HYSTERECTOMY     TUBAL LIGATION     Family History  Problem Relation Age of Onset   Diabetes Mother    Multiple sclerosis Mother    Heart disease Father    Hypertension Sister    Kidney disease Sister    Colon cancer Neg Hx    Colon polyps Neg Hx    Esophageal cancer Neg Hx    Rectal cancer Neg Hx    Stomach  cancer Neg Hx    Breast cancer Neg Hx    Social History   Socioeconomic History   Marital status: Widowed    Spouse name: Not on file   Number of children: 2   Years of education: 12   Highest education level: Associate degree: occupational, Scientist, product/process development, or vocational program  Occupational History   Occupation: Retired/disabled  Tobacco Use   Smoking status: Every Day    Current packs/day: 1.50    Average packs/day: 1.9 packs/day for 40.3 years (78.4 ttl pk-yrs)    Types: Cigarettes    Start date: 09/1983    Passive exposure: Current   Smokeless tobacco: Never  Vaping Use   Vaping status: Never Used  Substance and Sexual Activity   Alcohol use: No   Drug use: No   Sexual activity: Yes    Partners: Male  Other Topics Concern   Not on file  Social History Narrative   Occupation: retired [employer]   Widowed  (lost husband, son and sister since last seen) 01-01-2023   Alcohol no   Drug use no, no caffiene   Tobacco use- yes quit Dec '08   Regular Exercise no   4 children; reports increased stress in due to caring for grandchildren   UNEMPLOYED      Lives at home    Right-handed   Caffeine: 1-2 cup of coffee       Spouse and Son died 5 months apart   Social Drivers of Health   Financial Resource Strain: High Risk (01/06/2024)   Overall Financial Resource Strain (CARDIA)    Difficulty of Paying Living Expenses: Hard  Food Insecurity: Food Insecurity Present (01/06/2024)   Hunger Vital Sign    Worried About Running Out of Food in the Last Year: Sometimes true    Ran Out of Food in the Last Year: Never true  Transportation Needs: No Transportation Needs (01/06/2024)   PRAPARE - Administrator, Civil Service (Medical): No    Lack of Transportation (Non-Medical): No  Physical Activity: Inactive (01/06/2024)   Exercise Vital Sign    Days of Exercise per Week: 0 days    Minutes of Exercise per Session: 0 min  Stress: Stress Concern  Present (01/06/2024)   [employer] of Occupational Health - Occupational Stress Questionnaire    Feeling of Stress : Rather much  Social Connections: Socially Isolated (01/06/2024)   Social Connection and Isolation Panel [NHANES]    Frequency of Communication with Friends and Family: More than three times a week    Frequency of Social Gatherings with Friends and Family: More than three times a week    Attends Religious Services: Never    Database administrator or Organizations: No    Attends Banker Meetings: Never    Marital Status: Widowed    Tobacco  Counseling- Current Smoker Ready to quit: Not Answered Counseling given: Yes Pt stated had Lung Cancer Screening test on 01/02/2024.    Mammogram status: Completed 11/21/2022. Pt stated had a mammogram on 01/02/2024.    Clinical Intake:  Pre-visit preparation completed: Yes  Pain : No/denies pain     BMI - recorded: 23.79 Nutritional Status: BMI of 19-24  Normal Nutritional Risks: None Diabetes: No  How often do you need to have someone help you when you read instructions, pamphlets, or other written materials from your doctor or pharmacy?: 1 - Never  Interpreter Needed?: No  Information entered by :: Hassell Halim, CMA   Activities of Daily Living     01/06/2024    3:47 PM  In your present state of health, do you have any difficulty performing the following activities:  Hearing? 0  Vision? 0  Difficulty concentrating or making decisions? 0  Walking or climbing stairs? 0  Dressing or bathing? 0  Doing errands, shopping? 0  Preparing Food and eating ? N  Using the Toilet? N  In the past six months, have you accidently leaked urine? Y  Comment wears a pad/panty liner  Do you have problems with loss of bowel control? N  Managing your Medications? N  Managing your Finances? N  Housekeeping or managing your Housekeeping? N    Patient Care Team: Etta Grandchild, MD as PCP - General (Internal  Medicine) Jonelle Sidle, MD as PCP - Cardiology (Cardiology) Szabat, Vinnie Level, [facility] (Inactive) as Pharmacist (Pharmacist)  Indicate any recent Medical Services you may have received from other than [facility] providers in the past year (date may be approximate).     Assessment:   This is a routine wellness examination for [facility].  Hearing/Vision screen Hearing Screening - Comments:: Denies hearing difficulties   Vision Screening - Comments:: Wears rx glasses - up to date with routine eye exams with [facility]   Goals Addressed               This Visit's Progress     Patient Stated (pt-stated)        Patient stated she plans to stay active and get through with grieving for her Spouse/Son.       Depression Screen     01/06/2024    3:52 PM 12/04/2023   10:12 AM 07/22/2023    1:59 PM 01/03/2023    4:21 PM 11/12/2022    9:45 AM 06/06/2022    9:59 AM 12/19/2021    1:21 PM  PHQ 2/9 Scores  PHQ - 2 Score 2 0 1 4 4 4  0  PHQ- 9 Score 5  3 10 10 12      Fall Risk     01/06/2024    3:48 PM 12/04/2023   10:12 AM 01/03/2023    4:13 PM 06/06/2022    9:58 AM 12/19/2021    1:37 PM  Fall Risk   Falls in the past year? 0 0 0 0 0  Number falls in past yr: 0 0 0 0 0  Injury with Fall? 0 0 0 0 0  Risk for fall due to : No Fall Risks No Fall Risks No Fall Risks No Fall Risks No Fall Risks  Follow up Falls prevention discussed;Falls evaluation completed Falls evaluation completed Falls prevention discussed Falls evaluation completed Falls evaluation completed    MEDICARE RISK AT HOME:  Medicare Risk at Home Any stairs in or around the home?: No If so, are there  any without handrails?: No Home free of loose throw rugs in walkways, pet beds, electrical cords, etc?: Yes Adequate lighting in your home to reduce risk of falls?: Yes Life alert?: No Use of a cane, walker or w/c?: No Grab bars in the bathroom?: No Shower chair or bench in shower?: No Elevated toilet seat or a handicapped toilet?:  Yes  TIMED UP AND GO:  Was the test performed?  No  Cognitive Function: 6CIT completed        01/06/2024    3:49 PM 01/03/2023    4:24 PM 12/19/2021    1:37 PM  6CIT Screen  What Year? 0 points 0 points 0 points  What month? 0 points 0 points 0 points  What time? 0 points 0 points 0 points  Count back from 20 0 points 0 points 0 points  Months in reverse 0 points 0 points 0 points  Repeat phrase 0 points 0 points 0 points  Total Score 0 points 0 points 0 points    Immunizations Immunization History  Administered Date(s) Administered   Influenza Split 11/24/2012   Influenza, Seasonal, Injecte, Preservative Fre 07/22/2023   Influenza,inj,Quad PF,6+ Mos 08/20/2014, 12/02/2015, 08/20/2017, 06/22/2019, 07/07/2020, 09/13/2021, 11/12/2022   PFIZER(Purple Top)SARS-COV-2 Vaccination 01/02/2020, 02/02/2020, 10/10/2020   PNEUMOCOCCAL CONJUGATE-20 11/12/2022   Tdap 09/09/2014, 02/21/2022   Zoster Recombinant(Shingrix) 09/13/2021    Screening Tests Health Maintenance  Topic Date Due   Zoster Vaccines- Shingrix (2 of 2) 11/08/2021   COVID-19 Vaccine (4 - 2024-25 season) 06/30/2023   Lung Cancer Screening  07/24/2023   MAMMOGRAM  11/21/2024   Medicare Annual Wellness (AWV)  01/05/2025   DTaP/Tdap/Td (3 - Td or Tdap) 02/22/2032   Colonoscopy  09/15/2033   Pneumococcal Vaccine 5-67 Years old  Completed   INFLUENZA VACCINE  Completed   Hepatitis C Screening  Completed   HIV Screening  Completed   HPV VACCINES  Aged Out    Health Maintenance  Health Maintenance Due  Topic Date Due   Zoster Vaccines- Shingrix (2 of 2) 11/08/2021   COVID-19 Vaccine (4 - 2024-25 season) 06/30/2023   Lung Cancer Screening  07/24/2023   Health Maintenance Items Addressed:01/06/2024   Additional Screening:  Vision Screening: Recommended annual ophthalmology exams for early detection of glaucoma and other disorders of the eye. Pt states has [facility].  Dental Screening:  Recommended annual dental exams for proper oral hygiene  Community Resource Referral / Chronic Care Management: CRR required this visit?  No   CCM required this visit?  No     Plan:     I have personally reviewed and noted the following in the patient's chart:   Medical and social history Use of alcohol, tobacco or illicit drugs  Current medications and supplements including opioid prescriptions. Patient is currently taking opioid prescriptions. Information provided to patient regarding non-opioid alternatives. Patient advised to discuss non-opioid treatment plan with their provider. Functional ability and status Nutritional status Physical activity Advanced directives List of other physicians Hospitalizations, surgeries, and ER visits in previous 12 months Vitals Screenings to include cognitive, depression, and falls Referrals and appointments  In addition, I have reviewed and discussed with patient certain preventive protocols, quality metrics, and best practice recommendations. A written personalized care plan for preventive services as well as general preventive health recommendations were provided to patient.     Darreld Mclean, CMA   01/06/2024   After Visit Summary: (MyChart) Due to this being a telephonic visit,  the after visit summary with patients personalized plan was offered to patient via MyChart   Notes:  Pt stated had repeat Lung Cancer Screening and Mammogram tests done on 01/02/24.

## 2024-01-06 NOTE — Patient Instructions (Addendum)
 [name] , Thank you for taking time to come for your Medicare Wellness Visit. I appreciate your ongoing commitment to your health goals. Please review the following plan we discussed and let me know if I can assist you in the future.   Referrals/Orders/Follow-Ups/Clinician Recommendations: Aim for 30 minutes of exercise or brisk walking, 6-8 glasses of water, and 5 servings of fruits and vegetables each day. Educated and advised on getting the COVID and Shingles vaccines in 2025.  This is a list of the screening recommended for you and due dates:  Health Maintenance  Topic Date Due   Zoster (Shingles) Vaccine (2 of 2) 11/08/2021   COVID-19 Vaccine (4 - 2024-25 season) 06/30/2023   Screening for Lung Cancer  07/24/2023   Mammogram  11/21/2024   Medicare Annual Wellness Visit  01/05/2025   DTaP/Tdap/Td vaccine (3 - Td or Tdap) 02/22/2032   Colon Cancer Screening  09/15/2033   Pneumococcal Vaccination  Completed   Flu Shot  Completed   Hepatitis C Screening  Completed   HIV Screening  Completed   HPV Vaccine  Aged Out    Advanced directives: (Provided) Advance directive discussed with you today. I have provided a copy for you to complete at home and have notarized. Once this is complete, please bring a copy in to our office so we can scan it into your chart.   Next Medicare Annual Wellness Visit scheduled for next year: Yes  Managing Pain Without Opioids Opioids are strong medicines used to treat moderate to severe pain. For some people, especially those who have long-term (chronic) pain, opioids may not be the best choice for pain management due to: Side effects like nausea, constipation, and sleepiness. The risk of addiction (opioid use disorder). The longer you take opioids, the greater your risk of addiction. Pain that lasts for more than 3 months is called chronic pain. Managing chronic pain usually requires more than one approach and is often provided by a team of health care  providers working together (multidisciplinary approach). Pain management may be done at a pain management center or pain clinic. How to manage pain without the use of opioids Use non-opioid medicines Non-opioid medicines for pain may include: Over-the-counter or prescription non-steroidal anti-inflammatory drugs (NSAIDs). These may be the first medicines used for pain. They work well for muscle and bone pain, and they reduce swelling. Acetaminophen. This over-the-counter medicine may work well for milder pain but not swelling. Antidepressants. These may be used to treat chronic pain. A certain type of antidepressant (tricyclics) is often used. These medicines are given in lower doses for pain than when used for depression. Anticonvulsants. These are usually used to treat seizures but may also reduce nerve (neuropathic) pain. Muscle relaxants. These relieve pain caused by sudden muscle tightening (spasms). You may also use a pain medicine that is applied to the skin as a patch, cream, or gel (topical analgesic), such as a numbing medicine. These may cause fewer side effects than medicines taken by mouth. Do certain therapies as directed Some therapies can help with pain management. They include: Physical therapy. You will do exercises to gain strength and flexibility. A physical therapist may teach you exercises to move and stretch parts of your body that are weak, stiff, or painful. You can learn these exercises at physical therapy visits and practice them at home. Physical therapy may also involve: Massage. Heat wraps or applying heat or cold to affected areas. Electrical signals that interrupt pain signals (transcutaneous electrical nerve  stimulation, TENS). Weak lasers that reduce pain and swelling (low-level laser therapy). Signals from your body that help you learn to regulate pain (biofeedback). Occupational therapy. This helps you to learn ways to function at home and work with less  pain. Recreational therapy. This involves trying new activities or hobbies, such as a physical activity or drawing. Mental health therapy, including: Cognitive behavioral therapy (CBT). This helps you learn coping skills for dealing with pain. Acceptance and commitment therapy (ACT) to change the way you think and react to pain. Relaxation therapies, including muscle relaxation exercises and mindfulness-based stress reduction. Pain management counseling. This may be individual, family, or group counseling.  Receive medical treatments Medical treatments for pain management include: Nerve block injections. These may include a pain blocker and anti-inflammatory medicines. You may have injections: Near the spine to relieve chronic back or neck pain. Into joints to relieve back or joint pain. Into nerve areas that supply a painful area to relieve body pain. Into muscles (trigger point injections) to relieve some painful muscle conditions. A medical device placed near your spine to help block pain signals and relieve nerve pain or chronic back pain (spinal cord stimulation device). Acupuncture. Follow these instructions at home Medicines Take over-the-counter and prescription medicines only as told by your health care provider. If you are taking pain medicine, ask your health care providers about possible side effects to watch out for. Do not drive or use heavy machinery while taking prescription opioid pain medicine. Lifestyle  Do not use drugs or alcohol to reduce pain. If you drink alcohol, limit how much you have to: 0-1 drink a day for women who are not pregnant. 0-2 drinks a day for men. Know how much alcohol is in a drink. In the [place] one drink equals one 12 oz bottle of beer (355 mL), one 5 oz glass of wine (148 mL), or one 1 oz glass of hard liquor (44 mL). Do not use any products that contain nicotine or tobacco. These products include cigarettes, chewing tobacco, and vaping  devices, such as e-cigarettes. If you need help quitting, ask your health care provider. Eat a healthy diet and maintain a healthy weight. Poor diet and excess weight may make pain worse. Eat foods that are high in fiber. These include fresh fruits and vegetables, whole grains, and beans. Limit foods that are high in fat and processed sugars, such as fried and sweet foods. Exercise regularly. Exercise lowers stress and may help relieve pain. Ask your health care provider what activities and exercises are safe for you. If your health care provider approves, join an exercise class that combines movement and stress reduction. Examples include yoga and tai chi. Get enough sleep. Lack of sleep may make pain worse. Lower stress as much as possible. Practice stress reduction techniques as told by your therapist. General instructions Work with all your pain management providers to find the treatments that work best for you. You are an important member of your pain management team. There are many things you can do to reduce pain on your own. Consider joining an online or in-person support group for people who have chronic pain. Keep all follow-up visits. This is important. Where to find more information You can find more information about managing pain without opioids from: American Academy of Pain Medicine: painmed.org Institute for Chronic Pain: instituteforchronicpain.org American Chronic Pain Association: theacpa.org Contact a health care provider if: You have side effects from pain medicine. Your pain gets worse or does not  get better with treatments or home therapy. You are struggling with anxiety or depression. Summary Many types of pain can be managed without opioids. Chronic pain may respond better to pain management without opioids. Pain is best managed when you and a team of health care providers work together. Pain management without opioids may include non-opioid medicines, medical  treatments, physical therapy, mental health therapy, and lifestyle changes. Tell your health care providers if your pain gets worse or is not being managed well enough. This information is not intended to replace advice given to you by your health care provider. Make sure you discuss any questions you have with your health care provider. Document Revised: 01/25/2021 Document Reviewed: 01/25/2021 Elsevier Patient Education  2024 [employer].

## 2024-01-28 ENCOUNTER — Other Ambulatory Visit: Payer: Self-pay

## 2024-01-28 DIAGNOSIS — Z87891 Personal history of nicotine dependence: Secondary | ICD-10-CM

## 2024-01-28 DIAGNOSIS — Z122 Encounter for screening for malignant neoplasm of respiratory organs: Secondary | ICD-10-CM

## 2024-01-28 DIAGNOSIS — F17210 Nicotine dependence, cigarettes, uncomplicated: Secondary | ICD-10-CM

## 2024-02-18 ENCOUNTER — Other Ambulatory Visit: Payer: Self-pay | Admitting: Internal Medicine

## 2024-02-18 DIAGNOSIS — F411 Generalized anxiety disorder: Secondary | ICD-10-CM

## 2024-02-18 DIAGNOSIS — I10 Essential (primary) hypertension: Secondary | ICD-10-CM

## 2024-02-18 DIAGNOSIS — J301 Allergic rhinitis due to pollen: Secondary | ICD-10-CM

## 2024-02-21 ENCOUNTER — Other Ambulatory Visit: Payer: Self-pay | Admitting: Internal Medicine

## 2024-02-21 DIAGNOSIS — I10 Essential (primary) hypertension: Secondary | ICD-10-CM

## 2024-02-24 MED ORDER — AMLODIPINE BESYLATE 5 MG PO TABS: 5.0000 mg | ORAL_TABLET | Freq: Every day | ORAL | 0 refills | Status: DC

## 2024-03-30 ENCOUNTER — Other Ambulatory Visit: Payer: Self-pay | Admitting: Internal Medicine

## 2024-03-30 DIAGNOSIS — J301 Allergic rhinitis due to pollen: Secondary | ICD-10-CM

## 2024-05-27 ENCOUNTER — Other Ambulatory Visit: Payer: Self-pay | Admitting: Internal Medicine

## 2024-05-27 DIAGNOSIS — I10 Essential (primary) hypertension: Secondary | ICD-10-CM

## 2024-05-29 ENCOUNTER — Other Ambulatory Visit: Payer: Self-pay | Admitting: Internal Medicine

## 2024-05-29 DIAGNOSIS — I10 Essential (primary) hypertension: Secondary | ICD-10-CM

## 2024-06-02 NOTE — Telephone Encounter (Signed)
 Last OV 12/04/23 Next OV 01/11/25  Last refill 05/29/24 Qty #30/0

## 2024-07-05 ENCOUNTER — Other Ambulatory Visit: Payer: Self-pay | Admitting: Internal Medicine

## 2024-07-05 DIAGNOSIS — I10 Essential (primary) hypertension: Secondary | ICD-10-CM

## 2024-07-22 ENCOUNTER — Ambulatory Visit: Payer: Self-pay | Admitting: Internal Medicine

## 2024-07-22 ENCOUNTER — Ambulatory Visit (INDEPENDENT_AMBULATORY_CARE_PROVIDER_SITE_OTHER): Admitting: Internal Medicine

## 2024-07-22 ENCOUNTER — Ambulatory Visit: Payer: Self-pay

## 2024-07-22 ENCOUNTER — Encounter: Payer: Self-pay | Admitting: Internal Medicine

## 2024-07-22 VITALS — BP 128/90 | HR 66 | Temp 98.6°F | Ht 71.5 in | Wt 153.0 lb

## 2024-07-22 DIAGNOSIS — J329 Chronic sinusitis, unspecified: Secondary | ICD-10-CM

## 2024-07-22 DIAGNOSIS — J309 Allergic rhinitis, unspecified: Secondary | ICD-10-CM

## 2024-07-22 DIAGNOSIS — J310 Chronic rhinitis: Secondary | ICD-10-CM

## 2024-07-22 DIAGNOSIS — I10 Essential (primary) hypertension: Secondary | ICD-10-CM

## 2024-07-22 DIAGNOSIS — T485X5A Adverse effect of other anti-common-cold drugs, initial encounter: Secondary | ICD-10-CM | POA: Diagnosis not present

## 2024-07-22 MED ORDER — FLUTICASONE PROPIONATE 50 MCG/ACT NA SUSP: NASAL | 1 refills | Status: AC

## 2024-07-22 MED ORDER — AZITHROMYCIN 250 MG PO TABS: ORAL_TABLET | ORAL | 0 refills | Status: DC

## 2024-07-22 MED ORDER — METHYLPREDNISOLONE 4 MG PO TBPK: ORAL_TABLET | ORAL | 0 refills | Status: DC

## 2024-07-22 MED ORDER — AMLODIPINE BESYLATE 5 MG PO TABS: 5.0000 mg | ORAL_TABLET | Freq: Every day | ORAL | 0 refills | Status: AC

## 2024-07-22 NOTE — Patient Instructions (Addendum)
       Medications changes include :   zpak, flonase , medrok dose pak       Return if symptoms worsen or fail to improve.

## 2024-07-22 NOTE — Progress Notes (Signed)
 Subjective:    Patient ID: [name], female    DOB: [birth year], 63 y.o.   MRN: [id number]      HPI [name] is here for  Chief Complaint  Patient presents with   Nasal Congestion    Congestion x 1 month, sinus drainage and congestion (no cough)    Discussed the use of AI scribe software for clinical note transcription with the patient, who gave verbal consent to proceed.  History of Present Illness [name] is a 63 year old female who presents with congestion and dizziness.  She has been experiencing significant congestion and dizziness for the past one to two months. The congestion is accompanied by a sensation of pressure in her sinuses and a need to frequently clear her throat due to drainage. The dizziness occurs sporadically, sometimes while driving or shopping, and makes her feel 'foggy'.  She has a history of similar symptoms primarily in the spring and summer, which she attributes to allergies. In the past, she has been treated with antibiotics, specifically a Z-Pak, which she found effective. She has also used over-the-counter medications such as Vicks nasal spray and a [employer] brand allergy pill, as well as loratadine  and levocetirizine, to manage her symptoms. However, she notes that the Vicks Sonex provides only temporary relief and requires frequent use, up to five times a day.  No fevers, chills, sore throat, ear pain, chest symptoms, or nausea. She reports a good appetite. She mentions a history of neck surgeries and notes that certain head positions can exacerbate her symptoms, causing throbbing pain in the right upper neck area.  Her social history includes a recent return to smoking following the loss of her husband and son in 2023, which she acknowledges as a coping mechanism. She lives with her 65 year old grandson, who is supportive of her efforts to quit smoking.  She wants to quit again and knows she needs to quit again.       Medications and  allergies reviewed with patient and updated if appropriate.  Current Outpatient Medications on File Prior to Visit  Medication Sig Dispense Refill   ALPRAZolam  (XANAX ) 0.5 MG tablet TAKE 1 TABLET(0.5 MG) BY MOUTH AT BEDTIME 90 tablet 1   fluticasone  (FLONASE ) 50 MCG/ACT nasal spray SHAKE LIQUID AND USE 2 SPRAYS IN EACH NOSTRIL DAILY 48 g 1   HYDROcodone -acetaminophen  (NORCO/VICODIN) 5-325 MG tablet Take 1 tablet by mouth every 6 (six) hours as needed for moderate pain.     indapamide  (LOZOL ) 2.5 MG tablet TAKE 1 TABLET(2.5 MG) BY MOUTH DAILY 90 tablet 0   levocetirizine (XYZAL ) 5 MG tablet TAKE 1 TABLET(5 MG) BY MOUTH EVERY EVENING 90 tablet 1   LINZESS  145 MCG CAPS capsule Take 1 capsule (145 mcg total) by mouth daily before breakfast. 90 each 1   mirtazapine  (REMERON ) 7.5 MG tablet TAKE 1 TABLET(7.5 MG) BY MOUTH AT BEDTIME 90 tablet 0   tiZANidine (ZANAFLEX) 2 MG tablet Take 2 mg by mouth 3 (three) times daily as needed.     No current facility-administered medications on file prior to visit.    Review of Systems  Constitutional:  Negative for appetite change, chills and fever.  HENT:  Positive for congestion, postnasal drip, sinus pain and voice change. Negative for ear pain and sore throat.   Respiratory:  Negative for cough, shortness of breath and wheezing.   Gastrointestinal:  Negative for nausea.  Neurological:  Positive for dizziness and headaches.  Objective:   Vitals:   07/22/24 1102  BP: (!) 128/90  Pulse: 66  Temp: 98.6 F (37 C)  SpO2: 98%   BP Readings from Last 3 Encounters:  07/22/24 (!) 128/90  12/04/23 134/82  11/19/23 114/84   Wt Readings from Last 3 Encounters:  07/22/24 153 lb (69.4 kg)  01/06/24 173 lb (78.5 kg)  12/04/23 173 lb 2 oz (78.5 kg)   Body mass index is 21.04 kg/m.    Physical Exam Constitutional:      General: She is not in acute distress.    Appearance: Normal appearance. She is not ill-appearing.  HENT:     Head:  Normocephalic and atraumatic.     Right Ear: Tympanic membrane, ear canal and external ear normal.     Left Ear: Tympanic membrane, ear canal and external ear normal.     Mouth/Throat:     Mouth: Mucous membranes are moist.     Pharynx: No oropharyngeal exudate or posterior oropharyngeal erythema.  Eyes:     Conjunctiva/sclera: Conjunctivae normal.  Cardiovascular:     Rate and Rhythm: Normal rate and regular rhythm.  Pulmonary:     Effort: Pulmonary effort is normal. No respiratory distress.     Breath sounds: Normal breath sounds. No wheezing or rales.  Musculoskeletal:     Cervical back: Neck supple. No tenderness.  Lymphadenopathy:     Cervical: No cervical adenopathy.  Skin:    General: Skin is warm and dry.  Neurological:     Mental Status: She is alert.            Assessment & Plan:    Assessment and Plan Assessment & Plan Chronic sinusitis Subacute-chronic sinusitis with congestion, facial pain, and dizziness likely due to infection and allergies. - Prescribe azithromycin  (Z-Pak). - Prescribe short course of oral steroids for pain and pressure. - Prescribe Flonase  for nasal congestion. -Stop Vicks nasal spray  Rebound nasal congestion-rhinitis medicamentosa Rebound congestion from overuse of topical decongestants like Vicks Sonex, causing dependency and worsening symptoms. - Advise discontinuation of Vicks Sonex. - Educated on rebound congestion risks with prolonged decongestant use. - Recommend Flonase  instead of Vicks Sonex. - Will give Medrol  Dosepak to help get her off of the Vicks nasal spray and relieve some of the congestion  Essential hypertension Chronic.  Ran out of her amlodipine  earlier this month-was not able to get a refill from the pharmacy-slightly elevated diastolic pressure, systolic within range. - Ensure continuation of current antihypertensive medication. - Follow up with Doctor Joshua next month  Tobacco use disorder Tobacco use  disorder with recent relapse due to personal stressors. Strong desire to quit, acknowledging past success. - Encourage smoking cessation.  Allergic rhinitis Chronic -Continue oral antihistamine-discussed she does not necessarily need the Xyzal  and loratadine  and can drop one of them -Restart Flonase  discontinue Vicks nasal spray

## 2024-07-22 NOTE — Telephone Encounter (Signed)
 FYI Only or Action Required?: Action required by provider: medication refill request.  Patient was last seen in primary care on 12/04/2023 by Joshua Debby CROME, MD.  Called Nurse Triage reporting Headache, Dizziness, and Sinusitis.  Symptoms began 1-2 weeks ago.  Interventions attempted: OTC medications: sinex nasal spray and Prescription medications: levocetirizine, hydrocodone .  Symptoms are: sinus pressure/congestion/drainage, post nasal drip, dizziness, headache, pain behind right ear gradually worsening.  Triage Disposition: See PCP When Office is Open (Within 3 Days)  Patient/caregiver understands and will follow disposition?: Yes                Copied from CRM #[id number]. Topic: Clinical - Red Word Triage >> Jul 22, 2024  9:00 AM Aisha D wrote: Red Word that prompted transfer to Nurse Triage: headaches, dizziness  Pt stated that she is experiencing dizziness and headaches due to being off of the amLODipine  medication. Pt needs an appt before she can get the medication refilled. Pt would like to schedule an appt with provider.    ----------------------------------------------------------------------- From previous Reason for Contact - Scheduling: Patient/patient representative is calling to schedule an appointment. Refer to attachments for appointment information. Reason for Disposition  [1] Sinus congestion (pressure, fullness) AND [2] present > 10 days  Answer Assessment - Initial Assessment Questions Patient calling in for BP med refill, she is upset and states she has been calling in for a refill and was just now told she needed a follow up appointment. She states she feels sick from being off her amlodipine  for 2-2.5 weeks. She states she is not at home right now so she can't check her BP and she has not checked recently. Advised patient to check BP and call back with her reading.  1. LOCATION: Where does it hurt?      Between eyes, just above her nose; under  each eye on both sides of her nose (cheeks).  2. ONSET: When did the sinus pain start?  (e.g., hours, days)      1-2 weeks.  3. SEVERITY: How bad is the pain?   (Scale 0-10; or none, mild, moderate or severe)     Severe last night, took hydrocodone  stopped it a little but the ache was still there, was able to fall asleep. Moderate this morning. Comes and goes.  4. RECURRENT SYMPTOM: Have you ever had sinus problems before? If Yes, ask: When was the last time? and What happened that time?      Yes. She states she normally gets sinus infections about twice a year and has been getting worse.  5. NASAL CONGESTION: Is the nose blocked? If Yes, ask: Can you open it or must you breathe through your mouth?     Yes. She states every morning one or both nostrils will be stopped up.  6. NASAL DISCHARGE: Do you have discharge from your nose? If so ask, What color?     Yes. White with a little tint of can't really say its yellow, green, or brown.  7. FEVER: Do you have a fever? If Yes, ask: What is it, how was it measured, and when did it start?      No.  8. OTHER SYMPTOMS: Do you have any other symptoms? (e.g., sore throat, cough, earache, difficulty breathing)     Headache, behind right ear aching, post nasal drip, dizziness (x several months, worsening x 1-2 weeks). Denies chest pain, SOB, nausea, vomiting, unilateral numbness or weakness, changes in speech, loss of vision or double vision.  9. PREGNANCY: Is there any chance you are pregnant? When was your last menstrual period?     N/A.  Protocols used: Sinus Pain or Congestion-A-AH

## 2024-08-18 ENCOUNTER — Other Ambulatory Visit: Payer: Self-pay | Admitting: Internal Medicine

## 2024-08-18 ENCOUNTER — Telehealth: Payer: Self-pay

## 2024-08-18 DIAGNOSIS — E785 Hyperlipidemia, unspecified: Secondary | ICD-10-CM | POA: Insufficient documentation

## 2024-08-18 MED ORDER — PRAVASTATIN SODIUM 10 MG PO TABS: 10.0000 mg | ORAL_TABLET | Freq: Every day | ORAL | 0 refills | Status: DC

## 2024-08-18 NOTE — Telephone Encounter (Signed)
**Note De-identified  [place] Obfuscation** Please advise 

## 2024-08-18 NOTE — Telephone Encounter (Signed)
 Copied from CRM #[id number]. Topic: Clinical - Medication Question >> Aug 18, 2024 10:58 AM Robinson H wrote: Reason for CRM: Pharmacist calling stating claim records show patient has heart disease and no records of patient being on any statin medication, wants to know if provider will prescribe for patient and patient is willing to initia statin therapy and if provider can evaluate to see if it's appropriate. Will be faxing over documents  Neha [phone] Ext [phone]

## 2024-08-19 ENCOUNTER — Encounter: Payer: Self-pay | Admitting: Internal Medicine

## 2024-08-19 ENCOUNTER — Ambulatory Visit: Admitting: Internal Medicine

## 2024-08-19 ENCOUNTER — Ambulatory Visit: Payer: Self-pay | Admitting: Internal Medicine

## 2024-08-19 VITALS — BP 142/88 | HR 71 | Temp 98.3°F | Resp 16 | Ht 71.5 in | Wt 150.0 lb

## 2024-08-19 DIAGNOSIS — I10 Essential (primary) hypertension: Secondary | ICD-10-CM | POA: Diagnosis not present

## 2024-08-19 DIAGNOSIS — M5441 Lumbago with sciatica, right side: Secondary | ICD-10-CM | POA: Diagnosis not present

## 2024-08-19 DIAGNOSIS — T402X5A Adverse effect of other opioids, initial encounter: Secondary | ICD-10-CM

## 2024-08-19 DIAGNOSIS — M5126 Other intervertebral disc displacement, lumbar region: Secondary | ICD-10-CM

## 2024-08-19 DIAGNOSIS — Z23 Encounter for immunization: Secondary | ICD-10-CM

## 2024-08-19 DIAGNOSIS — K5903 Drug induced constipation: Secondary | ICD-10-CM | POA: Diagnosis not present

## 2024-08-19 DIAGNOSIS — E785 Hyperlipidemia, unspecified: Secondary | ICD-10-CM

## 2024-08-19 LAB — CBC WITH DIFFERENTIAL/PLATELET
Basophils Absolute: 0 K/uL (ref 0.0–0.1)
Basophils Relative: 0.5 % (ref 0.0–3.0)
Eosinophils Absolute: 0 K/uL (ref 0.0–0.7)
Eosinophils Relative: 0.9 % (ref 0.0–5.0)
HCT: 42.2 % (ref 36.0–46.0)
Hemoglobin: 13.7 g/dL (ref 12.0–15.0)
Lymphocytes Relative: 43.4 % (ref 12.0–46.0)
Lymphs Abs: 2.4 K/uL (ref 0.7–4.0)
MCHC: 32.5 g/dL (ref 30.0–36.0)
MCV: 90.2 fl (ref 78.0–100.0)
Monocytes Absolute: 0.3 K/uL (ref 0.1–1.0)
Monocytes Relative: 5.5 % (ref 3.0–12.0)
Neutro Abs: 2.7 K/uL (ref 1.4–7.7)
Neutrophils Relative %: 49.7 % (ref 43.0–77.0)
Platelets: 392 K/uL (ref 150.0–400.0)
RBC: 4.68 Mil/uL (ref 3.87–5.11)
RDW: 13.4 % (ref 11.5–15.5)
WBC: 5.4 K/uL (ref 4.0–10.5)

## 2024-08-19 LAB — URINALYSIS, ROUTINE W REFLEX MICROSCOPIC
Bilirubin Urine: NEGATIVE
Hgb urine dipstick: NEGATIVE
Ketones, ur: NEGATIVE
Leukocytes,Ua: NEGATIVE
Nitrite: NEGATIVE
Specific Gravity, Urine: 1.02 (ref 1.000–1.030)
Total Protein, Urine: NEGATIVE
Urine Glucose: NEGATIVE
Urobilinogen, UA: 1 (ref 0.0–1.0)
pH: 6 (ref 5.0–8.0)

## 2024-08-19 LAB — BASIC METABOLIC PANEL WITH GFR
BUN: 10 mg/dL (ref 6–23)
CO2: 26 meq/L (ref 19–32)
Calcium: 9.6 mg/dL (ref 8.4–10.5)
Chloride: 104 meq/L (ref 96–112)
Creatinine, Ser: 0.78 mg/dL (ref 0.40–1.20)
GFR: 80.86 mL/min (ref >60.00)
Glucose, Bld: 100 mg/dL — ABNORMAL HIGH (ref 70–99)
Potassium: 4 meq/L (ref 3.5–5.1)
Sodium: 139 meq/L (ref 135–145)

## 2024-08-19 LAB — MAGNESIUM: Magnesium: 2 mg/dL (ref 1.5–2.5)

## 2024-08-19 MED ORDER — PRAVASTATIN SODIUM 10 MG PO TABS: 10.0000 mg | ORAL_TABLET | Freq: Every day | ORAL | 0 refills | Status: AC

## 2024-08-19 MED ORDER — METHYLPREDNISOLONE 4 MG PO TBPK: ORAL_TABLET | ORAL | 0 refills | Status: AC

## 2024-08-19 MED ORDER — KETOROLAC TROMETHAMINE 60 MG/2ML IM SOLN: 60.0000 mg | Freq: Once | INTRAMUSCULAR | Status: DC

## 2024-08-19 MED ORDER — METHYLPREDNISOLONE ACETATE 40 MG/ML IJ SUSP: 40.0000 mg | Freq: Once | INTRAMUSCULAR | Status: DC

## 2024-08-19 MED ORDER — LUBIPROSTONE 24 MCG PO CAPS: 24.0000 ug | ORAL_CAPSULE | Freq: Two times a day (BID) | ORAL | 0 refills | Status: AC

## 2024-08-19 NOTE — Patient Instructions (Signed)
 Herniated Disk, Adult Learn some causes of a slipped disc, why it causes pain, and some of the ways a herniated disc may be treated without surgery.  To view the content, go to this web address: [email or web address]  This video will expire on: 03/24/2026. If you need access to this video following this date, please reach out to the healthcare provider who assigned it to you. This information is not intended to replace advice given to you by your health care provider. Make sure you discuss any questions you have with your health care provider. Elsevier Patient Education  [employer].

## 2024-08-19 NOTE — Progress Notes (Signed)
 "  Subjective:  Patient ID: [name], female    DOB: 03/07/1961  Age: 63 y.o. MRN: [id number]  CC: Medical Management of Chronic Issues (6 month follow up ), Back Pain (Right side lower back pain radiating down her leg. ), and Constipation (Patient states that if she uses the bathroom just a little then it helps with the pain that she's having in her back. )   HPI [name] presents for f/up ---  Discussed the use of AI scribe software for clinical note transcription with the patient, who gave verbal consent to proceed.  History of Present Illness [name] is a 63 year old female who presents with right low back pain radiating to the right leg.  She has been experiencing severe right low back pain for about a week, radiating down her right leg, sometimes affecting her leg. She has a history of back surgery and annual flare-ups of similar pain. 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  She reports numbness, weakness, and tingling in her feet, which she has experienced previously. She is currently taking hydrocodone  for pain and tizanidine as a muscle relaxer. She has not been using any anti-inflammatory medications such as ibuprofen , Motrin , or Aleve. Her pain management doctor has been unavailable for the past three to four weeks, and she has an upcoming appointment scheduled for the following Monday.  She is concerned about her kidneys and requests a check-up. She also mentions a history of high blood pressure. No chest pain or shortness of breath is reported, but she experiences sweating in her head. She has been smoking for the past five months, which she attributes to stress related to her husband.  No urinary symptoms such as painful or bloody urine, but she requests a urine check. She has not taken any pain medication today, with her last dose being the previous night. She  experiences increased pain when sitting for long periods.  She mentions receiving a message about starting a cholesterol medication but has not yet begun it. She sees a cardiologist annually and had her last EKG the previous year.     Outpatient Medications Prior to Visit  Medication Sig Dispense Refill   ALPRAZolam  (XANAX ) 0.5 MG tablet TAKE 1 TABLET(0.5 MG) BY MOUTH AT BEDTIME 90 tablet 1   amLODipine  (NORVASC ) 5 MG tablet Take 1 tablet (5 mg total) by mouth daily. 90 tablet 0   fluticasone  (FLONASE ) 50 MCG/ACT nasal spray SHAKE LIQUID AND USE 2 SPRAYS IN EACH NOSTRIL DAILY 48 g 1   HYDROcodone -acetaminophen  (NORCO/VICODIN) 5-325 MG tablet Take 1 tablet by mouth every 6 (six) hours as needed for moderate pain.     indapamide  (LOZOL ) 2.5 MG tablet TAKE 1 TABLET(2.5 MG) BY MOUTH DAILY 90 tablet 0   levocetirizine (XYZAL ) 5 MG tablet TAKE 1 TABLET(5 MG) BY MOUTH EVERY EVENING 90 tablet 1   mirtazapine  (REMERON ) 7.5 MG tablet TAKE 1 TABLET(7.5 MG) BY MOUTH AT BEDTIME 90 tablet 0   tiZANidine (ZANAFLEX) 2 MG tablet Take 2 mg by mouth 3 (three) times daily as needed.     LINZESS  145 MCG CAPS capsule Take 1 capsule (145 mcg total) by mouth daily before breakfast. 90 each 1   pravastatin  (PRAVACHOL ) 10 MG tablet Take 1 tablet (10 mg total) by mouth daily. 90 tablet 0   No facility-administered medications prior to visit.  ROS Review of Systems  Constitutional: Negative.  Negative for appetite change, chills, diaphoresis, fatigue and fever.  HENT: Negative.    Respiratory: Negative.  Negative for cough, chest tightness, shortness of breath and wheezing.   Cardiovascular:  Negative for chest pain, palpitations and leg swelling.  Gastrointestinal:  Positive for constipation. Negative for abdominal pain, blood in stool, diarrhea, nausea and vomiting.  Genitourinary:  Negative for difficulty urinating.  Musculoskeletal:  Positive for arthralgias and back pain. Negative for joint swelling and  myalgias.  Skin: Negative.   Neurological:  Negative for dizziness, weakness and headaches.  Hematological:  Negative for adenopathy. Does not bruise/bleed easily.  Psychiatric/Behavioral:  The patient is nervous/anxious.     Objective:  BP (!) 142/88 (BP Location: Left Arm, Patient Position: Sitting, Cuff Size: Normal)   Pulse 71   Temp 98.3 F (36.8 C) (Oral)   Resp 16   Ht 5' 11.5 (1.816 m)   Wt 150 lb (68 kg)   SpO2 99%   BMI 20.63 kg/m   BP Readings from Last 3 Encounters:  08/19/24 (!) 142/88  07/22/24 (!) 128/90  12/04/23 134/82    Wt Readings from Last 3 Encounters:  08/19/24 150 lb (68 kg)  07/22/24 153 lb (69.4 kg)  01/06/24 173 lb (78.5 kg)    Physical Exam Vitals reviewed.  Constitutional:      Appearance: Normal appearance.  HENT:     Mouth/Throat:     Mouth: Mucous membranes are moist.  Eyes:     General: No scleral icterus.    Conjunctiva/sclera: Conjunctivae normal.  Cardiovascular:     Rate and Rhythm: Normal rate and regular rhythm.     Heart sounds: No murmur heard.    No friction rub. No gallop.     Comments: EKG-- NSR, 60 bpm +++artifact No LVH, Q waves, or ST/T wave changes  Unchanged   Pulmonary:     Effort: Pulmonary effort is normal.     Breath sounds: No stridor. No wheezing, rhonchi or rales.  Abdominal:     General: Abdomen is flat. Bowel sounds are normal. There is no distension.     Palpations: Abdomen is soft. There is no hepatomegaly, splenomegaly or mass.     Tenderness: There is no abdominal tenderness. There is no guarding.  Musculoskeletal:     Cervical back: Neck supple.     Thoracic back: Normal.     Lumbar back: No swelling, deformity, spasms, tenderness or bony tenderness. Normal range of motion. Positive right straight leg raise test. Negative left straight leg raise test.     Right lower leg: No edema.     Left lower leg: No edema.  Lymphadenopathy:     Cervical: No cervical adenopathy.  Skin:    General:  Skin is warm and dry.     Findings: No rash.  Neurological:     Mental Status: She is alert.     Cranial Nerves: Cranial nerves 2-12 are intact.     Sensory: Sensation is intact.     Motor: Motor function is intact. No weakness.     Coordination: Coordination is intact.     Gait: Gait is intact. Gait and tandem walk normal.     Deep Tendon Reflexes: Reflexes normal.     Reflex Scores:      Tricep reflexes are 1+ on the right side and 1+ on the left side.      Bicep reflexes are 1+ on the right side and 1+ on  the left side.      Brachioradialis reflexes are 1+ on the right side and 1+ on the left side.      Patellar reflexes are 2+ on the right side and 2+ on the left side.      Achilles reflexes are 1+ on the right side and 1+ on the left side.    Lab Results  Component Value Date   WBC 5.4 08/19/2024   HGB 13.7 08/19/2024   HCT 42.2 08/19/2024   PLT 392.0 08/19/2024   GLUCOSE 100 (H) 08/19/2024   CHOL 183 12/04/2023   TRIG 93.0 12/04/2023   HDL 64.40 12/04/2023   LDLCALC 100 (H) 12/04/2023   ALT 17 12/04/2023   AST 18 12/04/2023   NA 139 08/19/2024   K 4.0 08/19/2024   CL 104 08/19/2024   CREATININE 0.78 08/19/2024   BUN 10 08/19/2024   CO2 26 08/19/2024   TSH 2.00 12/04/2023   HGBA1C 5.5 12/04/2023    CT CHEST LUNG CA SCREEN LOW DOSE W/O CM Result Date: 01/27/2024 CLINICAL DATA:  39 pack-year smoker. EXAM: CT CHEST WITHOUT CONTRAST LOW-DOSE FOR LUNG CANCER SCREENING TECHNIQUE: Multidetector CT imaging of the chest was performed following the standard protocol without IV contrast. RADIATION DOSE REDUCTION: This exam was performed according to the departmental dose-optimization program which includes automated exposure control, adjustment of the mA and/or kV according to patient size and/or use of iterative reconstruction technique. COMPARISON:  07/23/2022. FINDINGS: Cardiovascular: Atherosclerotic calcification of the aorta and coronary arteries. Heart is at the upper  limits of normal in size to mildly enlarged. No pericardial effusion. Mediastinum/Nodes: No pathologically enlarged mediastinal or axillary lymph nodes. Hilar regions are difficult to definitively evaluate without IV contrast. Esophagus is grossly unremarkable. Lungs/Pleura: Centrilobular and paraseptal emphysema. 2.6 mm right upper lobe nodule, stable. No new pulmonary nodules. No pleural fluid. Airway is unremarkable. Upper Abdomen: Cholecystectomy. Visualized portions of the liver, adrenal glands, kidneys, spleen, pancreas, stomach and bowel are otherwise grossly unremarkable. No upper abdominal adenopathy. Musculoskeletal: Minimal degenerative change in the spine. Lipoma in the left shoulder musculature. IMPRESSION: 1. Lung-RADS 2, benign appearance or behavior. Continue annual screening with low-dose chest CT without contrast in 12 months. 2. Aortic atherosclerosis (ICD10-I70.0). Coronary artery calcification. 3.  Emphysema (ICD10-J43.9). Electronically Signed   By: Newell Eke M.D.   On: 01/27/2024 17:42   MM 3D SCREENING MAMMOGRAM BILATERAL BREAST Result Date: 01/07/2024 CLINICAL DATA:  Screening. EXAM: DIGITAL SCREENING BILATERAL MAMMOGRAM WITH TOMOSYNTHESIS AND CAD TECHNIQUE: Bilateral screening digital craniocaudal and mediolateral oblique mammograms were obtained. Bilateral screening digital breast tomosynthesis was performed. The images were evaluated with computer-aided detection. COMPARISON:  Previous exam(s). ACR Breast Density Category b: There are scattered areas of fibroglandular density. FINDINGS: There are no findings suspicious for malignancy. IMPRESSION: No mammographic evidence of malignancy. A result letter of this screening mammogram will be mailed directly to the patient. RECOMMENDATION: Screening mammogram in one year. (Code:[id number]-B-01Y) BI-RADS CATEGORY  1: Negative. Electronically Signed   By: Dobrinka  Dimitrova M.D.   On: 01/07/2024 13:51    Assessment & Plan:   Essential  hypertension- BP is not at goal. Will work on her lifestyle modifications. -     EKG 12-Lead -     Basic metabolic panel with GFR; Future -     CBC with Differential/Platelet; Future -     Urinalysis, Routine w reflex microscopic; Future  Dyslipidemia, goal LDL below 70 -     Pravastatin  Sodium; Take 1 tablet (10  mg total) by mouth daily.  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  Dispense: 180 capsule; Refill: 0 -     Magnesium ; Future     Follow-up: Return in about 3 months (around 11/19/2024).  Debby Molt, MD "

## 2024-10-17 ENCOUNTER — Other Ambulatory Visit: Payer: Self-pay | Admitting: Internal Medicine

## 2024-10-17 DIAGNOSIS — I10 Essential (primary) hypertension: Secondary | ICD-10-CM

## 2024-11-24 ENCOUNTER — Emergency Department (HOSPITAL_COMMUNITY)
Admission: EM | Admit: 2024-11-24 | Discharge: 2024-11-25 | Disposition: A | Attending: Emergency Medicine | Admitting: Emergency Medicine

## 2024-11-24 ENCOUNTER — Encounter (HOSPITAL_COMMUNITY): Payer: Self-pay

## 2024-11-24 ENCOUNTER — Other Ambulatory Visit: Payer: Self-pay

## 2024-11-24 DIAGNOSIS — R112 Nausea with vomiting, unspecified: Secondary | ICD-10-CM | POA: Insufficient documentation

## 2024-11-24 DIAGNOSIS — R140 Abdominal distension (gaseous): Secondary | ICD-10-CM | POA: Diagnosis not present

## 2024-11-24 DIAGNOSIS — Z79899 Other long term (current) drug therapy: Secondary | ICD-10-CM | POA: Diagnosis not present

## 2024-11-24 DIAGNOSIS — R55 Syncope and collapse: Secondary | ICD-10-CM | POA: Insufficient documentation

## 2024-11-24 DIAGNOSIS — R1084 Generalized abdominal pain: Secondary | ICD-10-CM | POA: Diagnosis not present

## 2024-11-24 MED ORDER — SODIUM CHLORIDE 0.9 % IV BOLUS
500.0000 mL | Freq: Once | INTRAVENOUS | Status: AC
  Administered 2024-11-24: 500 mL via INTRAVENOUS

## 2024-11-24 NOTE — ED Provider Notes (Signed)
 " [facility] AT [facility] Provider Note   CSN: [id number] Arrival date & time: 11/24/24  2247     Patient presents with: No chief complaint on file.   [name] is a 64 y.o. female.  {Add pertinent medical, surgical, social history, OB history to YEP:67052} Presents after syncopal episode.  Patient reports that she was sleeping, got up to go to the bathroom when episode occurred.  She does report that she has had some abdominal distention and discomfort.  Her grandson was able to catch her, laid her to the ground, no fall or injury.  She briefly lost consciousness then when she woke up has vomited multiple times.  Comes to the ER by ambulance.  She was given Zofran  during transport, nausea and vomiting has improved.  No chest pain or shortness of breath.       Prior to Admission medications  Medication Sig Start Date End Date Taking? Authorizing Provider  ALPRAZolam  (XANAX ) 0.5 MG tablet TAKE 1 TABLET(0.5 MG) BY MOUTH AT BEDTIME 03/09/24   Joshua Debby CROME, MD  amLODipine  (NORVASC ) 5 MG tablet TAKE 1 TABLET(5 MG) BY MOUTH DAILY 10/19/24   Joshua Debby CROME, MD  fluticasone  (FLONASE ) 50 MCG/ACT nasal spray SHAKE LIQUID AND USE 2 SPRAYS IN EACH NOSTRIL DAILY 07/22/24   Burns, Glade PARAS, MD  HYDROcodone -acetaminophen  (NORCO/VICODIN) 5-325 MG tablet Take 1 tablet by mouth every 6 (six) hours as needed for moderate pain.    [provider]  levocetirizine (XYZAL ) 5 MG tablet TAKE 1 TABLET(5 MG) BY MOUTH EVERY EVENING 04/01/24   Joshua Debby CROME, MD  lubiprostone  (AMITIZA ) 24 MCG capsule Take 1 capsule (24 mcg total) by mouth 2 (two) times daily with a meal. 08/19/24   Joshua Debby CROME, MD  mirtazapine  (REMERON ) 7.5 MG tablet TAKE 1 TABLET(7.5 MG) BY MOUTH AT BEDTIME 09/17/22   Joshua Debby CROME, MD  pravastatin  (PRAVACHOL ) 10 MG tablet Take 1 tablet (10 mg total) by mouth daily. 08/19/24   Joshua Debby CROME, MD  tiZANidine (ZANAFLEX) 2 MG tablet Take 2 mg by  mouth 3 (three) times daily as needed. 01/11/21   [provider]    Allergies: Livalo  [pitavastatin ], Anesthesia s-i-40 [propofol], Augmentin  [amoxicillin -pot clavulanate], and Doxycycline     Review of Systems  Updated Vital Signs BP 120/79   Pulse 72   Temp 98.7 F (37.1 C) (Oral)   Resp 18   Wt 70.8 kg   SpO2 97%   BMI 21.45 kg/m   Physical Exam Vitals and nursing note reviewed.  Constitutional:      General: She is not in acute distress.    Appearance: She is well-developed.  HENT:     Head: Normocephalic and atraumatic.     Mouth/Throat:     Mouth: Mucous membranes are moist.  Eyes:     General: Vision grossly intact. Gaze aligned appropriately.     Extraocular Movements: Extraocular movements intact.     Conjunctiva/sclera: Conjunctivae normal.  Cardiovascular:     Rate and Rhythm: Normal rate and regular rhythm.     Pulses: Normal pulses.     Heart sounds: Normal heart sounds, S1 normal and S2 normal. No murmur heard.    No friction rub. No gallop.  Pulmonary:     Effort: Pulmonary effort is normal. No respiratory distress.     Breath sounds: Normal breath sounds.  Abdominal:     General: Bowel sounds are normal.     Palpations: Abdomen is  soft.     Tenderness: There is generalized abdominal tenderness. There is no guarding or rebound.     Hernia: No hernia is present.  Musculoskeletal:        General: No swelling.     Cervical back: Full passive range of motion without pain, normal range of motion and neck supple. No spinous process tenderness or muscular tenderness. Normal range of motion.     Right lower leg: No edema.     Left lower leg: No edema.  Skin:    General: Skin is warm and dry.     Capillary Refill: Capillary refill takes less than 2 seconds.     Findings: No ecchymosis, erythema, rash or wound.  Neurological:     General: No focal deficit present.     Mental Status: She is alert and oriented to person, place, and time.     GCS: GCS  eye subscore is 4. GCS verbal subscore is 5. GCS motor subscore is 6.     Cranial Nerves: Cranial nerves 2-12 are intact.     Sensory: Sensation is intact.     Motor: Motor function is intact.     Coordination: Coordination is intact.  Psychiatric:        Attention and Perception: Attention normal.        Mood and Affect: Mood normal.        Speech: Speech normal.        Behavior: Behavior normal.     (all labs ordered are listed, but only abnormal results are displayed) Labs Reviewed  CBC WITH DIFFERENTIAL/PLATELET  COMPREHENSIVE METABOLIC PANEL WITH GFR  LIPASE, BLOOD  URINALYSIS, ROUTINE W REFLEX MICROSCOPIC  TROPONIN T, HIGH SENSITIVITY    EKG: None  Radiology: No results found.  {Document cardiac monitor, telemetry assessment procedure when appropriate:32947} Procedures   Medications Ordered in the ED  sodium chloride  0.9 % bolus 500 mL (has no administration in time range)      {Click here for ABCD2, HEART and other calculators REFRESH Note before signing:1}                              Medical Decision Making Amount and/or Complexity of Data Reviewed Labs: ordered.   ***  {Document critical care time when appropriate  Document review of labs and clinical decision tools ie CHADS2VASC2, etc  Document your independent review of radiology images and any outside records  Document your discussion with family members, caretakers and with consultants  Document social determinants of health affecting pt's care  Document your decision making why or why not admission, treatments were needed:32947:::1}   Final diagnoses:  None    ED Discharge Orders     None        "

## 2024-11-24 NOTE — ED Triage Notes (Signed)
 Pt via [facility] c/o syncope around 10 pm tonight. When ems arrived pt was alert and in floor and had episode of emesis. BP was 100/30 on arrival and IV 20g L AC and 500 bolus given.

## 2024-11-25 ENCOUNTER — Emergency Department (HOSPITAL_COMMUNITY)

## 2024-11-25 LAB — COMPREHENSIVE METABOLIC PANEL WITH GFR
ALT: 14 U/L (ref 0–44)
AST: 22 U/L (ref 15–41)
Albumin: 4.1 g/dL (ref 3.5–5.0)
Alkaline Phosphatase: 82 U/L (ref 38–126)
Anion gap: 12 (ref 5–15)
BUN: 7 mg/dL — ABNORMAL LOW (ref 8–23)
CO2: 23 mmol/L (ref 22–32)
Calcium: 9 mg/dL (ref 8.9–10.3)
Chloride: 107 mmol/L (ref 98–111)
Creatinine, Ser: 0.71 mg/dL (ref 0.44–1.00)
GFR, Estimated: >60 mL/min
Glucose, Bld: 88 mg/dL (ref 70–99)
Potassium: 4.6 mmol/L (ref 3.5–5.1)
Sodium: 143 mmol/L (ref 135–145)
Total Bilirubin: 0.6 mg/dL (ref 0.0–1.2)
Total Protein: 7.4 g/dL (ref 6.5–8.1)

## 2024-11-25 LAB — CBC WITH DIFFERENTIAL/PLATELET
Abs Immature Granulocytes: 0.01 10*3/uL (ref 0.00–0.07)
Basophils Absolute: 0.1 10*3/uL (ref 0.0–0.1)
Basophils Relative: 1 %
Eosinophils Absolute: 0.1 10*3/uL (ref 0.0–0.5)
Eosinophils Relative: 2 %
HCT: 40 % (ref 36.0–46.0)
Hemoglobin: 13 g/dL (ref 12.0–15.0)
Immature Granulocytes: 0 %
Lymphocytes Relative: 33 %
Lymphs Abs: 2.6 10*3/uL (ref 0.7–4.0)
MCH: 29.3 pg (ref 26.0–34.0)
MCHC: 32.5 g/dL (ref 30.0–36.0)
MCV: 90.1 fL (ref 80.0–100.0)
Monocytes Absolute: 0.5 10*3/uL (ref 0.1–1.0)
Monocytes Relative: 7 %
Neutro Abs: 4.4 10*3/uL (ref 1.7–7.7)
Neutrophils Relative %: 57 %
Platelets: 359 10*3/uL (ref 150–400)
RBC: 4.44 MIL/uL (ref 3.87–5.11)
RDW: 12.8 % (ref 11.5–15.5)
WBC: 7.7 10*3/uL (ref 4.0–10.5)
nRBC: 0 % (ref 0.0–0.2)

## 2024-11-25 LAB — URINALYSIS, ROUTINE W REFLEX MICROSCOPIC
Bilirubin Urine: NEGATIVE
Glucose, UA: NEGATIVE mg/dL
Hgb urine dipstick: NEGATIVE
Ketones, ur: NEGATIVE mg/dL
Leukocytes,Ua: NEGATIVE
Nitrite: NEGATIVE
Protein, ur: NEGATIVE mg/dL
Specific Gravity, Urine: >1.046 — ABNORMAL HIGH (ref 1.005–1.030)
pH: 7 (ref 5.0–8.0)

## 2024-11-25 LAB — LIPASE, BLOOD: Lipase: 52 U/L — ABNORMAL HIGH (ref 11–51)

## 2024-11-25 LAB — TROPONIN T, HIGH SENSITIVITY: Troponin T High Sensitivity: <6 ng/L (ref 0–19)

## 2024-11-25 MED ORDER — IOHEXOL 300 MG/ML  SOLN
100.0000 mL | Freq: Once | INTRAMUSCULAR | Status: AC | PRN
  Administered 2024-11-25: 100 mL via INTRAVENOUS

## 2024-11-25 MED ORDER — ONDANSETRON 4 MG PO TBDP: ORAL_TABLET | ORAL | 0 refills | Status: AC

## 2025-01-04 ENCOUNTER — Ambulatory Visit (HOSPITAL_COMMUNITY)

## 2025-01-11 ENCOUNTER — Ambulatory Visit

## 2025-01-11 ENCOUNTER — Encounter: Admitting: Internal Medicine
